# Patient Record
Sex: Female | Born: 1940
Health system: Southern US, Community
[De-identification: ages and names within clinical notes are randomized; demographics above are authoritative.]

---

## 2016-04-14 DIAGNOSIS — R350 Frequency of micturition: Secondary | ICD-10-CM | POA: Diagnosis not present

## 2016-04-14 DIAGNOSIS — R933 Abnormal findings on diagnostic imaging of other parts of digestive tract: Secondary | ICD-10-CM | POA: Diagnosis not present

## 2016-04-14 DIAGNOSIS — Z87442 Personal history of urinary calculi: Secondary | ICD-10-CM

## 2016-04-14 DIAGNOSIS — R339 Retention of urine, unspecified: Secondary | ICD-10-CM | POA: Diagnosis not present

## 2016-04-14 DIAGNOSIS — N390 Urinary tract infection, site not specified: Secondary | ICD-10-CM | POA: Diagnosis not present

## 2016-04-14 HISTORY — DX: Personal history of urinary calculi: Z87.442

## 2016-05-27 DIAGNOSIS — E559 Vitamin D deficiency, unspecified: Secondary | ICD-10-CM | POA: Diagnosis not present

## 2016-05-27 DIAGNOSIS — E7800 Pure hypercholesterolemia, unspecified: Secondary | ICD-10-CM | POA: Diagnosis not present

## 2016-06-05 DIAGNOSIS — M150 Primary generalized (osteo)arthritis: Secondary | ICD-10-CM | POA: Diagnosis not present

## 2016-06-05 DIAGNOSIS — Z6841 Body Mass Index (BMI) 40.0 and over, adult: Secondary | ICD-10-CM | POA: Diagnosis not present

## 2016-06-05 DIAGNOSIS — H6123 Impacted cerumen, bilateral: Secondary | ICD-10-CM | POA: Diagnosis not present

## 2016-06-05 DIAGNOSIS — R350 Frequency of micturition: Secondary | ICD-10-CM | POA: Diagnosis not present

## 2016-06-05 DIAGNOSIS — Z79899 Other long term (current) drug therapy: Secondary | ICD-10-CM | POA: Diagnosis not present

## 2016-06-05 DIAGNOSIS — E7800 Pure hypercholesterolemia, unspecified: Secondary | ICD-10-CM | POA: Diagnosis not present

## 2016-06-05 DIAGNOSIS — I10 Essential (primary) hypertension: Secondary | ICD-10-CM | POA: Diagnosis not present

## 2016-07-10 DIAGNOSIS — N1330 Unspecified hydronephrosis: Secondary | ICD-10-CM | POA: Diagnosis not present

## 2016-07-10 DIAGNOSIS — R102 Pelvic and perineal pain: Secondary | ICD-10-CM | POA: Diagnosis not present

## 2016-07-10 DIAGNOSIS — R938 Abnormal findings on diagnostic imaging of other specified body structures: Secondary | ICD-10-CM | POA: Diagnosis not present

## 2016-07-10 DIAGNOSIS — N281 Cyst of kidney, acquired: Secondary | ICD-10-CM | POA: Diagnosis not present

## 2016-07-10 DIAGNOSIS — N852 Hypertrophy of uterus: Secondary | ICD-10-CM | POA: Diagnosis not present

## 2016-09-04 DIAGNOSIS — Z0000 Encounter for general adult medical examination without abnormal findings: Secondary | ICD-10-CM | POA: Diagnosis not present

## 2016-09-04 DIAGNOSIS — Z7189 Other specified counseling: Secondary | ICD-10-CM | POA: Diagnosis not present

## 2016-09-17 DIAGNOSIS — M79675 Pain in left toe(s): Secondary | ICD-10-CM | POA: Diagnosis not present

## 2016-09-17 DIAGNOSIS — M79674 Pain in right toe(s): Secondary | ICD-10-CM | POA: Diagnosis not present

## 2016-09-17 DIAGNOSIS — I739 Peripheral vascular disease, unspecified: Secondary | ICD-10-CM | POA: Diagnosis not present

## 2016-09-17 DIAGNOSIS — L851 Acquired keratosis [keratoderma] palmaris et plantaris: Secondary | ICD-10-CM | POA: Diagnosis not present

## 2016-09-17 DIAGNOSIS — L609 Nail disorder, unspecified: Secondary | ICD-10-CM | POA: Diagnosis not present

## 2016-09-17 DIAGNOSIS — B351 Tinea unguium: Secondary | ICD-10-CM | POA: Diagnosis not present

## 2016-10-23 DIAGNOSIS — K649 Unspecified hemorrhoids: Secondary | ICD-10-CM | POA: Diagnosis not present

## 2016-10-23 DIAGNOSIS — R350 Frequency of micturition: Secondary | ICD-10-CM | POA: Diagnosis not present

## 2016-10-23 DIAGNOSIS — N814 Uterovaginal prolapse, unspecified: Secondary | ICD-10-CM | POA: Diagnosis not present

## 2016-10-23 DIAGNOSIS — R600 Localized edema: Secondary | ICD-10-CM | POA: Diagnosis not present

## 2016-11-05 DIAGNOSIS — Z9181 History of falling: Secondary | ICD-10-CM | POA: Diagnosis not present

## 2016-11-05 DIAGNOSIS — K648 Other hemorrhoids: Secondary | ICD-10-CM | POA: Diagnosis not present

## 2016-11-26 DIAGNOSIS — H5213 Myopia, bilateral: Secondary | ICD-10-CM | POA: Diagnosis not present

## 2016-12-08 DIAGNOSIS — R351 Nocturia: Secondary | ICD-10-CM | POA: Diagnosis not present

## 2016-12-08 DIAGNOSIS — R350 Frequency of micturition: Secondary | ICD-10-CM | POA: Diagnosis not present

## 2016-12-08 DIAGNOSIS — M2550 Pain in unspecified joint: Secondary | ICD-10-CM | POA: Diagnosis not present

## 2016-12-08 DIAGNOSIS — Z23 Encounter for immunization: Secondary | ICD-10-CM | POA: Diagnosis not present

## 2016-12-08 DIAGNOSIS — K644 Residual hemorrhoidal skin tags: Secondary | ICD-10-CM | POA: Diagnosis not present

## 2016-12-08 DIAGNOSIS — G4733 Obstructive sleep apnea (adult) (pediatric): Secondary | ICD-10-CM | POA: Diagnosis not present

## 2016-12-11 DIAGNOSIS — E785 Hyperlipidemia, unspecified: Secondary | ICD-10-CM | POA: Diagnosis not present

## 2016-12-11 DIAGNOSIS — I10 Essential (primary) hypertension: Secondary | ICD-10-CM | POA: Diagnosis not present

## 2016-12-11 DIAGNOSIS — K644 Residual hemorrhoidal skin tags: Secondary | ICD-10-CM | POA: Diagnosis not present

## 2016-12-11 DIAGNOSIS — M1990 Unspecified osteoarthritis, unspecified site: Secondary | ICD-10-CM | POA: Diagnosis not present

## 2016-12-11 DIAGNOSIS — K641 Second degree hemorrhoids: Secondary | ICD-10-CM | POA: Diagnosis not present

## 2017-02-19 DIAGNOSIS — R1033 Periumbilical pain: Secondary | ICD-10-CM | POA: Diagnosis not present

## 2017-02-19 DIAGNOSIS — R1084 Generalized abdominal pain: Secondary | ICD-10-CM | POA: Diagnosis not present

## 2017-02-19 DIAGNOSIS — R1013 Epigastric pain: Secondary | ICD-10-CM | POA: Diagnosis not present

## 2017-02-19 DIAGNOSIS — Z791 Long term (current) use of non-steroidal anti-inflammatories (NSAID): Secondary | ICD-10-CM | POA: Diagnosis not present

## 2017-02-19 DIAGNOSIS — Z79899 Other long term (current) drug therapy: Secondary | ICD-10-CM | POA: Diagnosis not present

## 2017-03-05 DIAGNOSIS — R109 Unspecified abdominal pain: Secondary | ICD-10-CM | POA: Diagnosis not present

## 2017-03-05 DIAGNOSIS — N281 Cyst of kidney, acquired: Secondary | ICD-10-CM | POA: Diagnosis not present

## 2017-03-05 DIAGNOSIS — K8020 Calculus of gallbladder without cholecystitis without obstruction: Secondary | ICD-10-CM | POA: Diagnosis not present

## 2017-03-10 DIAGNOSIS — H9190 Unspecified hearing loss, unspecified ear: Secondary | ICD-10-CM | POA: Diagnosis not present

## 2017-03-11 DIAGNOSIS — R1011 Right upper quadrant pain: Secondary | ICD-10-CM | POA: Diagnosis not present

## 2017-03-11 DIAGNOSIS — K8010 Calculus of gallbladder with chronic cholecystitis without obstruction: Secondary | ICD-10-CM | POA: Diagnosis not present

## 2017-03-11 DIAGNOSIS — N200 Calculus of kidney: Secondary | ICD-10-CM | POA: Diagnosis not present

## 2017-03-11 DIAGNOSIS — K8020 Calculus of gallbladder without cholecystitis without obstruction: Secondary | ICD-10-CM | POA: Diagnosis not present

## 2017-03-11 DIAGNOSIS — K8000 Calculus of gallbladder with acute cholecystitis without obstruction: Secondary | ICD-10-CM | POA: Diagnosis not present

## 2017-03-11 DIAGNOSIS — M5410 Radiculopathy, site unspecified: Secondary | ICD-10-CM | POA: Diagnosis not present

## 2017-03-11 DIAGNOSIS — M1990 Unspecified osteoarthritis, unspecified site: Secondary | ICD-10-CM | POA: Diagnosis not present

## 2017-03-11 DIAGNOSIS — Z79810 Long term (current) use of selective estrogen receptor modulators (SERMs): Secondary | ICD-10-CM | POA: Diagnosis not present

## 2017-03-11 DIAGNOSIS — K810 Acute cholecystitis: Secondary | ICD-10-CM | POA: Diagnosis not present

## 2017-03-11 DIAGNOSIS — I10 Essential (primary) hypertension: Secondary | ICD-10-CM | POA: Diagnosis not present

## 2017-03-11 DIAGNOSIS — K819 Cholecystitis, unspecified: Secondary | ICD-10-CM | POA: Diagnosis not present

## 2017-03-11 DIAGNOSIS — K828 Other specified diseases of gallbladder: Secondary | ICD-10-CM | POA: Diagnosis not present

## 2017-03-11 DIAGNOSIS — E785 Hyperlipidemia, unspecified: Secondary | ICD-10-CM | POA: Diagnosis not present

## 2017-03-11 DIAGNOSIS — R1013 Epigastric pain: Secondary | ICD-10-CM | POA: Diagnosis not present

## 2017-03-11 DIAGNOSIS — K219 Gastro-esophageal reflux disease without esophagitis: Secondary | ICD-10-CM | POA: Diagnosis not present

## 2017-03-15 DIAGNOSIS — L539 Erythematous condition, unspecified: Secondary | ICD-10-CM | POA: Diagnosis not present

## 2017-03-15 DIAGNOSIS — K769 Liver disease, unspecified: Secondary | ICD-10-CM | POA: Diagnosis not present

## 2017-03-15 DIAGNOSIS — K5900 Constipation, unspecified: Secondary | ICD-10-CM | POA: Diagnosis not present

## 2017-03-15 DIAGNOSIS — E876 Hypokalemia: Secondary | ICD-10-CM | POA: Diagnosis not present

## 2017-03-15 DIAGNOSIS — R1030 Lower abdominal pain, unspecified: Secondary | ICD-10-CM | POA: Diagnosis not present

## 2017-03-15 DIAGNOSIS — N858 Other specified noninflammatory disorders of uterus: Secondary | ICD-10-CM | POA: Diagnosis not present

## 2017-03-15 DIAGNOSIS — I10 Essential (primary) hypertension: Secondary | ICD-10-CM | POA: Diagnosis not present

## 2017-03-15 DIAGNOSIS — R140 Abdominal distension (gaseous): Secondary | ICD-10-CM | POA: Diagnosis not present

## 2017-03-15 DIAGNOSIS — R935 Abnormal findings on diagnostic imaging of other abdominal regions, including retroperitoneum: Secondary | ICD-10-CM | POA: Diagnosis not present

## 2017-03-15 DIAGNOSIS — M4802 Spinal stenosis, cervical region: Secondary | ICD-10-CM | POA: Diagnosis not present

## 2017-03-15 DIAGNOSIS — N3289 Other specified disorders of bladder: Secondary | ICD-10-CM | POA: Diagnosis not present

## 2017-03-15 DIAGNOSIS — E785 Hyperlipidemia, unspecified: Secondary | ICD-10-CM | POA: Diagnosis not present

## 2017-03-15 DIAGNOSIS — K649 Unspecified hemorrhoids: Secondary | ICD-10-CM | POA: Diagnosis not present

## 2017-03-15 DIAGNOSIS — K219 Gastro-esophageal reflux disease without esophagitis: Secondary | ICD-10-CM | POA: Diagnosis not present

## 2017-03-15 DIAGNOSIS — D649 Anemia, unspecified: Secondary | ICD-10-CM | POA: Diagnosis not present

## 2017-03-15 DIAGNOSIS — R319 Hematuria, unspecified: Secondary | ICD-10-CM | POA: Diagnosis not present

## 2017-03-15 DIAGNOSIS — R1031 Right lower quadrant pain: Secondary | ICD-10-CM | POA: Diagnosis not present

## 2017-03-15 DIAGNOSIS — G629 Polyneuropathy, unspecified: Secondary | ICD-10-CM | POA: Diagnosis not present

## 2017-03-15 DIAGNOSIS — R932 Abnormal findings on diagnostic imaging of liver and biliary tract: Secondary | ICD-10-CM | POA: Diagnosis not present

## 2017-03-15 DIAGNOSIS — N368 Other specified disorders of urethra: Secondary | ICD-10-CM | POA: Diagnosis not present

## 2017-03-15 DIAGNOSIS — C541 Malignant neoplasm of endometrium: Secondary | ICD-10-CM | POA: Diagnosis not present

## 2017-03-16 DIAGNOSIS — N858 Other specified noninflammatory disorders of uterus: Secondary | ICD-10-CM | POA: Diagnosis not present

## 2017-03-16 DIAGNOSIS — K769 Liver disease, unspecified: Secondary | ICD-10-CM | POA: Diagnosis not present

## 2017-03-16 DIAGNOSIS — R197 Diarrhea, unspecified: Secondary | ICD-10-CM | POA: Diagnosis not present

## 2017-03-16 DIAGNOSIS — R319 Hematuria, unspecified: Secondary | ICD-10-CM | POA: Diagnosis not present

## 2017-03-16 DIAGNOSIS — E785 Hyperlipidemia, unspecified: Secondary | ICD-10-CM | POA: Diagnosis not present

## 2017-03-16 DIAGNOSIS — K649 Unspecified hemorrhoids: Secondary | ICD-10-CM | POA: Insufficient documentation

## 2017-03-16 DIAGNOSIS — G629 Polyneuropathy, unspecified: Secondary | ICD-10-CM | POA: Diagnosis not present

## 2017-03-16 DIAGNOSIS — E876 Hypokalemia: Secondary | ICD-10-CM | POA: Diagnosis not present

## 2017-03-16 DIAGNOSIS — D649 Anemia, unspecified: Secondary | ICD-10-CM | POA: Diagnosis not present

## 2017-03-16 DIAGNOSIS — Z9049 Acquired absence of other specified parts of digestive tract: Secondary | ICD-10-CM

## 2017-03-16 DIAGNOSIS — R1030 Lower abdominal pain, unspecified: Secondary | ICD-10-CM | POA: Diagnosis not present

## 2017-03-16 DIAGNOSIS — R109 Unspecified abdominal pain: Secondary | ICD-10-CM | POA: Diagnosis not present

## 2017-03-16 DIAGNOSIS — K219 Gastro-esophageal reflux disease without esophagitis: Secondary | ICD-10-CM | POA: Diagnosis not present

## 2017-03-16 DIAGNOSIS — I10 Essential (primary) hypertension: Secondary | ICD-10-CM | POA: Diagnosis not present

## 2017-03-16 DIAGNOSIS — C541 Malignant neoplasm of endometrium: Secondary | ICD-10-CM | POA: Diagnosis not present

## 2017-03-16 DIAGNOSIS — M4802 Spinal stenosis, cervical region: Secondary | ICD-10-CM | POA: Diagnosis not present

## 2017-03-16 HISTORY — DX: Unspecified hemorrhoids: K64.9

## 2017-03-16 HISTORY — DX: Acquired absence of other specified parts of digestive tract: Z90.49

## 2017-03-17 DIAGNOSIS — K5900 Constipation, unspecified: Secondary | ICD-10-CM | POA: Diagnosis not present

## 2017-03-17 DIAGNOSIS — N858 Other specified noninflammatory disorders of uterus: Secondary | ICD-10-CM | POA: Diagnosis not present

## 2017-03-17 DIAGNOSIS — N859 Noninflammatory disorder of uterus, unspecified: Secondary | ICD-10-CM | POA: Diagnosis not present

## 2017-03-17 DIAGNOSIS — R1900 Intra-abdominal and pelvic swelling, mass and lump, unspecified site: Secondary | ICD-10-CM | POA: Diagnosis not present

## 2017-03-17 DIAGNOSIS — I10 Essential (primary) hypertension: Secondary | ICD-10-CM | POA: Diagnosis not present

## 2017-03-17 DIAGNOSIS — R1030 Lower abdominal pain, unspecified: Secondary | ICD-10-CM | POA: Diagnosis not present

## 2017-03-17 DIAGNOSIS — R109 Unspecified abdominal pain: Secondary | ICD-10-CM | POA: Diagnosis not present

## 2017-03-17 DIAGNOSIS — K649 Unspecified hemorrhoids: Secondary | ICD-10-CM | POA: Diagnosis not present

## 2017-03-17 DIAGNOSIS — R319 Hematuria, unspecified: Secondary | ICD-10-CM | POA: Diagnosis not present

## 2017-03-18 DIAGNOSIS — R109 Unspecified abdominal pain: Secondary | ICD-10-CM | POA: Diagnosis not present

## 2017-03-18 DIAGNOSIS — N858 Other specified noninflammatory disorders of uterus: Secondary | ICD-10-CM | POA: Diagnosis not present

## 2017-03-18 DIAGNOSIS — R970 Elevated carcinoembryonic antigen [CEA]: Secondary | ICD-10-CM | POA: Diagnosis not present

## 2017-03-18 DIAGNOSIS — K649 Unspecified hemorrhoids: Secondary | ICD-10-CM | POA: Diagnosis not present

## 2017-03-18 DIAGNOSIS — Z9049 Acquired absence of other specified parts of digestive tract: Secondary | ICD-10-CM | POA: Diagnosis not present

## 2017-03-18 DIAGNOSIS — R1030 Lower abdominal pain, unspecified: Secondary | ICD-10-CM | POA: Diagnosis not present

## 2017-03-20 DIAGNOSIS — Z7409 Other reduced mobility: Secondary | ICD-10-CM | POA: Diagnosis not present

## 2017-03-20 DIAGNOSIS — N897 Hematocolpos: Secondary | ICD-10-CM | POA: Diagnosis not present

## 2017-03-20 DIAGNOSIS — M7989 Other specified soft tissue disorders: Secondary | ICD-10-CM | POA: Diagnosis not present

## 2017-03-20 DIAGNOSIS — N858 Other specified noninflammatory disorders of uterus: Secondary | ICD-10-CM | POA: Diagnosis not present

## 2017-03-20 DIAGNOSIS — Q6471 Congenital prolapse of urethra: Secondary | ICD-10-CM | POA: Diagnosis not present

## 2017-03-20 DIAGNOSIS — R1030 Lower abdominal pain, unspecified: Secondary | ICD-10-CM | POA: Diagnosis not present

## 2017-03-20 DIAGNOSIS — I10 Essential (primary) hypertension: Secondary | ICD-10-CM | POA: Diagnosis not present

## 2017-03-23 DIAGNOSIS — N898 Other specified noninflammatory disorders of vagina: Secondary | ICD-10-CM | POA: Diagnosis not present

## 2017-03-23 DIAGNOSIS — L539 Erythematous condition, unspecified: Secondary | ICD-10-CM | POA: Diagnosis not present

## 2017-03-23 DIAGNOSIS — N897 Hematocolpos: Secondary | ICD-10-CM | POA: Diagnosis not present

## 2017-03-23 DIAGNOSIS — N852 Hypertrophy of uterus: Secondary | ICD-10-CM | POA: Diagnosis not present

## 2017-03-23 DIAGNOSIS — N760 Acute vaginitis: Secondary | ICD-10-CM | POA: Diagnosis not present

## 2017-03-23 DIAGNOSIS — R600 Localized edema: Secondary | ICD-10-CM | POA: Diagnosis not present

## 2017-03-23 DIAGNOSIS — E785 Hyperlipidemia, unspecified: Secondary | ICD-10-CM | POA: Diagnosis not present

## 2017-03-23 DIAGNOSIS — M79604 Pain in right leg: Secondary | ICD-10-CM | POA: Diagnosis not present

## 2017-03-23 DIAGNOSIS — Z6835 Body mass index (BMI) 35.0-35.9, adult: Secondary | ICD-10-CM | POA: Diagnosis not present

## 2017-03-23 DIAGNOSIS — N1330 Unspecified hydronephrosis: Secondary | ICD-10-CM | POA: Diagnosis not present

## 2017-03-23 DIAGNOSIS — I10 Essential (primary) hypertension: Secondary | ICD-10-CM | POA: Diagnosis not present

## 2017-03-23 DIAGNOSIS — Z79899 Other long term (current) drug therapy: Secondary | ICD-10-CM | POA: Diagnosis not present

## 2017-04-02 DIAGNOSIS — M79675 Pain in left toe(s): Secondary | ICD-10-CM | POA: Diagnosis not present

## 2017-04-02 DIAGNOSIS — I739 Peripheral vascular disease, unspecified: Secondary | ICD-10-CM | POA: Diagnosis not present

## 2017-04-02 DIAGNOSIS — B351 Tinea unguium: Secondary | ICD-10-CM | POA: Diagnosis not present

## 2017-04-02 DIAGNOSIS — L84 Corns and callosities: Secondary | ICD-10-CM | POA: Diagnosis not present

## 2017-04-02 DIAGNOSIS — M79674 Pain in right toe(s): Secondary | ICD-10-CM | POA: Diagnosis not present

## 2017-05-08 DIAGNOSIS — Z09 Encounter for follow-up examination after completed treatment for conditions other than malignant neoplasm: Secondary | ICD-10-CM | POA: Diagnosis not present

## 2017-05-08 DIAGNOSIS — N882 Stricture and stenosis of cervix uteri: Secondary | ICD-10-CM | POA: Diagnosis not present

## 2017-05-21 DIAGNOSIS — M2550 Pain in unspecified joint: Secondary | ICD-10-CM | POA: Diagnosis not present

## 2017-05-21 DIAGNOSIS — M150 Primary generalized (osteo)arthritis: Secondary | ICD-10-CM | POA: Diagnosis not present

## 2017-05-26 DIAGNOSIS — L723 Sebaceous cyst: Secondary | ICD-10-CM | POA: Diagnosis not present

## 2017-05-26 DIAGNOSIS — E669 Obesity, unspecified: Secondary | ICD-10-CM | POA: Diagnosis not present

## 2017-05-26 DIAGNOSIS — H6121 Impacted cerumen, right ear: Secondary | ICD-10-CM | POA: Diagnosis not present

## 2017-05-26 DIAGNOSIS — N3946 Mixed incontinence: Secondary | ICD-10-CM | POA: Diagnosis not present

## 2017-05-26 DIAGNOSIS — H259 Unspecified age-related cataract: Secondary | ICD-10-CM | POA: Diagnosis not present

## 2017-07-01 DIAGNOSIS — M1990 Unspecified osteoarthritis, unspecified site: Secondary | ICD-10-CM

## 2017-07-01 DIAGNOSIS — E782 Mixed hyperlipidemia: Secondary | ICD-10-CM | POA: Diagnosis present

## 2017-07-01 DIAGNOSIS — E785 Hyperlipidemia, unspecified: Secondary | ICD-10-CM

## 2017-07-01 DIAGNOSIS — H25013 Cortical age-related cataract, bilateral: Secondary | ICD-10-CM | POA: Diagnosis not present

## 2017-07-01 DIAGNOSIS — I10 Essential (primary) hypertension: Secondary | ICD-10-CM

## 2017-07-01 HISTORY — DX: Hyperlipidemia, unspecified: E78.5

## 2017-07-01 HISTORY — DX: Essential (primary) hypertension: I10

## 2017-07-01 HISTORY — DX: Unspecified osteoarthritis, unspecified site: M19.90

## 2017-07-02 DIAGNOSIS — L723 Sebaceous cyst: Secondary | ICD-10-CM | POA: Diagnosis not present

## 2017-07-02 DIAGNOSIS — L219 Seborrheic dermatitis, unspecified: Secondary | ICD-10-CM | POA: Diagnosis not present

## 2017-07-23 DIAGNOSIS — E7800 Pure hypercholesterolemia, unspecified: Secondary | ICD-10-CM | POA: Diagnosis not present

## 2017-07-23 DIAGNOSIS — R7302 Impaired glucose tolerance (oral): Secondary | ICD-10-CM | POA: Diagnosis not present

## 2017-08-06 DIAGNOSIS — I10 Essential (primary) hypertension: Secondary | ICD-10-CM | POA: Diagnosis not present

## 2017-08-06 DIAGNOSIS — Z79899 Other long term (current) drug therapy: Secondary | ICD-10-CM | POA: Diagnosis not present

## 2017-08-06 DIAGNOSIS — Z9049 Acquired absence of other specified parts of digestive tract: Secondary | ICD-10-CM | POA: Diagnosis not present

## 2017-08-06 DIAGNOSIS — G8929 Other chronic pain: Secondary | ICD-10-CM | POA: Diagnosis not present

## 2017-08-06 DIAGNOSIS — H2512 Age-related nuclear cataract, left eye: Secondary | ICD-10-CM | POA: Diagnosis not present

## 2017-08-06 DIAGNOSIS — M170 Bilateral primary osteoarthritis of knee: Secondary | ICD-10-CM | POA: Diagnosis not present

## 2017-08-06 DIAGNOSIS — M160 Bilateral primary osteoarthritis of hip: Secondary | ICD-10-CM | POA: Diagnosis not present

## 2017-08-06 DIAGNOSIS — E785 Hyperlipidemia, unspecified: Secondary | ICD-10-CM | POA: Diagnosis not present

## 2017-08-27 DIAGNOSIS — H259 Unspecified age-related cataract: Secondary | ICD-10-CM | POA: Diagnosis not present

## 2017-08-27 DIAGNOSIS — B351 Tinea unguium: Secondary | ICD-10-CM | POA: Diagnosis not present

## 2017-08-27 DIAGNOSIS — Z6838 Body mass index (BMI) 38.0-38.9, adult: Secondary | ICD-10-CM | POA: Diagnosis not present

## 2017-08-27 DIAGNOSIS — E7800 Pure hypercholesterolemia, unspecified: Secondary | ICD-10-CM | POA: Diagnosis not present

## 2017-08-27 DIAGNOSIS — N3946 Mixed incontinence: Secondary | ICD-10-CM | POA: Diagnosis not present

## 2017-10-15 DIAGNOSIS — Z23 Encounter for immunization: Secondary | ICD-10-CM | POA: Diagnosis not present

## 2017-10-15 DIAGNOSIS — R220 Localized swelling, mass and lump, head: Secondary | ICD-10-CM | POA: Diagnosis not present

## 2017-10-22 DIAGNOSIS — Z961 Presence of intraocular lens: Secondary | ICD-10-CM | POA: Diagnosis not present

## 2017-10-22 DIAGNOSIS — Z9842 Cataract extraction status, left eye: Secondary | ICD-10-CM | POA: Diagnosis not present

## 2017-10-22 DIAGNOSIS — H25011 Cortical age-related cataract, right eye: Secondary | ICD-10-CM | POA: Diagnosis not present

## 2017-10-22 DIAGNOSIS — Z79891 Long term (current) use of opiate analgesic: Secondary | ICD-10-CM | POA: Diagnosis not present

## 2017-10-22 DIAGNOSIS — I10 Essential (primary) hypertension: Secondary | ICD-10-CM | POA: Diagnosis not present

## 2017-10-22 DIAGNOSIS — E785 Hyperlipidemia, unspecified: Secondary | ICD-10-CM | POA: Diagnosis not present

## 2017-10-22 DIAGNOSIS — H52221 Regular astigmatism, right eye: Secondary | ICD-10-CM | POA: Diagnosis not present

## 2017-10-22 DIAGNOSIS — H2511 Age-related nuclear cataract, right eye: Secondary | ICD-10-CM | POA: Diagnosis not present

## 2017-10-22 DIAGNOSIS — Z79899 Other long term (current) drug therapy: Secondary | ICD-10-CM | POA: Diagnosis not present

## 2017-10-22 DIAGNOSIS — G8929 Other chronic pain: Secondary | ICD-10-CM | POA: Diagnosis not present

## 2017-11-13 DIAGNOSIS — N858 Other specified noninflammatory disorders of uterus: Secondary | ICD-10-CM | POA: Diagnosis not present

## 2017-12-17 DIAGNOSIS — Z6838 Body mass index (BMI) 38.0-38.9, adult: Secondary | ICD-10-CM | POA: Diagnosis not present

## 2017-12-17 DIAGNOSIS — R600 Localized edema: Secondary | ICD-10-CM | POA: Diagnosis not present

## 2017-12-17 DIAGNOSIS — M2550 Pain in unspecified joint: Secondary | ICD-10-CM | POA: Diagnosis not present

## 2017-12-23 DIAGNOSIS — R600 Localized edema: Secondary | ICD-10-CM | POA: Diagnosis not present

## 2017-12-25 DIAGNOSIS — M2550 Pain in unspecified joint: Secondary | ICD-10-CM | POA: Diagnosis not present

## 2017-12-25 DIAGNOSIS — M25562 Pain in left knee: Secondary | ICD-10-CM | POA: Diagnosis not present

## 2017-12-25 DIAGNOSIS — M25561 Pain in right knee: Secondary | ICD-10-CM | POA: Diagnosis not present

## 2017-12-25 DIAGNOSIS — M1612 Unilateral primary osteoarthritis, left hip: Secondary | ICD-10-CM | POA: Diagnosis not present

## 2017-12-25 DIAGNOSIS — M170 Bilateral primary osteoarthritis of knee: Secondary | ICD-10-CM | POA: Diagnosis not present

## 2017-12-28 DIAGNOSIS — R609 Edema, unspecified: Secondary | ICD-10-CM | POA: Diagnosis not present

## 2017-12-28 DIAGNOSIS — G8929 Other chronic pain: Secondary | ICD-10-CM | POA: Diagnosis not present

## 2017-12-28 DIAGNOSIS — R220 Localized swelling, mass and lump, head: Secondary | ICD-10-CM | POA: Diagnosis not present

## 2017-12-28 DIAGNOSIS — I10 Essential (primary) hypertension: Secondary | ICD-10-CM | POA: Diagnosis not present

## 2017-12-28 DIAGNOSIS — Z6839 Body mass index (BMI) 39.0-39.9, adult: Secondary | ICD-10-CM | POA: Diagnosis not present

## 2017-12-28 DIAGNOSIS — M150 Primary generalized (osteo)arthritis: Secondary | ICD-10-CM | POA: Diagnosis not present

## 2017-12-30 DIAGNOSIS — R220 Localized swelling, mass and lump, head: Secondary | ICD-10-CM | POA: Diagnosis not present

## 2018-01-11 DIAGNOSIS — R220 Localized swelling, mass and lump, head: Secondary | ICD-10-CM | POA: Diagnosis not present

## 2018-01-11 DIAGNOSIS — E042 Nontoxic multinodular goiter: Secondary | ICD-10-CM | POA: Diagnosis not present

## 2018-01-11 DIAGNOSIS — R590 Localized enlarged lymph nodes: Secondary | ICD-10-CM | POA: Diagnosis not present

## 2018-01-13 DIAGNOSIS — R220 Localized swelling, mass and lump, head: Secondary | ICD-10-CM | POA: Diagnosis not present

## 2018-01-27 DIAGNOSIS — R600 Localized edema: Secondary | ICD-10-CM | POA: Diagnosis not present

## 2018-02-15 DIAGNOSIS — R220 Localized swelling, mass and lump, head: Secondary | ICD-10-CM | POA: Diagnosis not present

## 2018-02-15 DIAGNOSIS — I889 Nonspecific lymphadenitis, unspecified: Secondary | ICD-10-CM | POA: Diagnosis not present

## 2018-02-15 DIAGNOSIS — K219 Gastro-esophageal reflux disease without esophagitis: Secondary | ICD-10-CM | POA: Diagnosis not present

## 2018-02-15 DIAGNOSIS — Z79899 Other long term (current) drug therapy: Secondary | ICD-10-CM | POA: Diagnosis not present

## 2018-02-15 DIAGNOSIS — I10 Essential (primary) hypertension: Secondary | ICD-10-CM | POA: Diagnosis not present

## 2018-02-15 DIAGNOSIS — K118 Other diseases of salivary glands: Secondary | ICD-10-CM | POA: Diagnosis not present

## 2018-02-15 DIAGNOSIS — E785 Hyperlipidemia, unspecified: Secondary | ICD-10-CM | POA: Diagnosis not present

## 2018-02-15 DIAGNOSIS — M1990 Unspecified osteoarthritis, unspecified site: Secondary | ICD-10-CM | POA: Diagnosis not present

## 2018-02-24 DIAGNOSIS — R220 Localized swelling, mass and lump, head: Secondary | ICD-10-CM | POA: Diagnosis not present

## 2018-02-24 DIAGNOSIS — E049 Nontoxic goiter, unspecified: Secondary | ICD-10-CM | POA: Diagnosis not present

## 2018-02-24 DIAGNOSIS — Z09 Encounter for follow-up examination after completed treatment for conditions other than malignant neoplasm: Secondary | ICD-10-CM | POA: Diagnosis not present

## 2018-02-25 DIAGNOSIS — E7800 Pure hypercholesterolemia, unspecified: Secondary | ICD-10-CM | POA: Diagnosis not present

## 2018-03-02 DIAGNOSIS — R946 Abnormal results of thyroid function studies: Secondary | ICD-10-CM | POA: Diagnosis not present

## 2018-03-02 DIAGNOSIS — M2550 Pain in unspecified joint: Secondary | ICD-10-CM | POA: Diagnosis not present

## 2018-03-02 DIAGNOSIS — R600 Localized edema: Secondary | ICD-10-CM | POA: Diagnosis not present

## 2018-03-02 DIAGNOSIS — R69 Illness, unspecified: Secondary | ICD-10-CM | POA: Diagnosis not present

## 2018-03-11 DIAGNOSIS — E042 Nontoxic multinodular goiter: Secondary | ICD-10-CM | POA: Diagnosis not present

## 2018-03-11 DIAGNOSIS — R220 Localized swelling, mass and lump, head: Secondary | ICD-10-CM | POA: Diagnosis not present

## 2018-03-11 DIAGNOSIS — R221 Localized swelling, mass and lump, neck: Secondary | ICD-10-CM | POA: Diagnosis not present

## 2018-03-30 DIAGNOSIS — E042 Nontoxic multinodular goiter: Secondary | ICD-10-CM | POA: Diagnosis not present

## 2018-03-30 DIAGNOSIS — E049 Nontoxic goiter, unspecified: Secondary | ICD-10-CM | POA: Diagnosis not present

## 2018-06-01 DIAGNOSIS — E042 Nontoxic multinodular goiter: Secondary | ICD-10-CM

## 2018-06-01 DIAGNOSIS — R220 Localized swelling, mass and lump, head: Secondary | ICD-10-CM | POA: Diagnosis not present

## 2018-06-01 DIAGNOSIS — Z6841 Body Mass Index (BMI) 40.0 and over, adult: Secondary | ICD-10-CM | POA: Diagnosis not present

## 2018-06-01 HISTORY — DX: Nontoxic multinodular goiter: E04.2

## 2018-09-02 DIAGNOSIS — E042 Nontoxic multinodular goiter: Secondary | ICD-10-CM | POA: Diagnosis not present

## 2018-09-06 DIAGNOSIS — R600 Localized edema: Secondary | ICD-10-CM | POA: Diagnosis not present

## 2018-09-06 DIAGNOSIS — M2550 Pain in unspecified joint: Secondary | ICD-10-CM | POA: Diagnosis not present

## 2018-09-06 DIAGNOSIS — E7800 Pure hypercholesterolemia, unspecified: Secondary | ICD-10-CM | POA: Diagnosis not present

## 2018-09-06 DIAGNOSIS — E0590 Thyrotoxicosis, unspecified without thyrotoxic crisis or storm: Secondary | ICD-10-CM | POA: Diagnosis not present

## 2018-11-03 DIAGNOSIS — E7800 Pure hypercholesterolemia, unspecified: Secondary | ICD-10-CM | POA: Diagnosis not present

## 2018-11-03 DIAGNOSIS — E0590 Thyrotoxicosis, unspecified without thyrotoxic crisis or storm: Secondary | ICD-10-CM | POA: Diagnosis not present

## 2018-11-04 DIAGNOSIS — Z23 Encounter for immunization: Secondary | ICD-10-CM | POA: Diagnosis not present

## 2018-12-07 DIAGNOSIS — R600 Localized edema: Secondary | ICD-10-CM | POA: Diagnosis not present

## 2018-12-07 DIAGNOSIS — E0590 Thyrotoxicosis, unspecified without thyrotoxic crisis or storm: Secondary | ICD-10-CM | POA: Diagnosis not present

## 2018-12-07 DIAGNOSIS — E7800 Pure hypercholesterolemia, unspecified: Secondary | ICD-10-CM | POA: Diagnosis not present

## 2018-12-07 DIAGNOSIS — M2550 Pain in unspecified joint: Secondary | ICD-10-CM | POA: Diagnosis not present

## 2019-03-07 DIAGNOSIS — E039 Hypothyroidism, unspecified: Secondary | ICD-10-CM | POA: Diagnosis not present

## 2019-03-07 DIAGNOSIS — E0590 Thyrotoxicosis, unspecified without thyrotoxic crisis or storm: Secondary | ICD-10-CM | POA: Diagnosis not present

## 2019-03-17 DIAGNOSIS — E0590 Thyrotoxicosis, unspecified without thyrotoxic crisis or storm: Secondary | ICD-10-CM | POA: Diagnosis not present

## 2019-03-17 DIAGNOSIS — R600 Localized edema: Secondary | ICD-10-CM | POA: Diagnosis not present

## 2019-03-17 DIAGNOSIS — M2550 Pain in unspecified joint: Secondary | ICD-10-CM | POA: Diagnosis not present

## 2019-03-17 DIAGNOSIS — Z6838 Body mass index (BMI) 38.0-38.9, adult: Secondary | ICD-10-CM | POA: Diagnosis not present

## 2019-03-24 DIAGNOSIS — Z6841 Body Mass Index (BMI) 40.0 and over, adult: Secondary | ICD-10-CM | POA: Diagnosis not present

## 2019-03-24 DIAGNOSIS — E0590 Thyrotoxicosis, unspecified without thyrotoxic crisis or storm: Secondary | ICD-10-CM | POA: Diagnosis not present

## 2019-03-24 DIAGNOSIS — I10 Essential (primary) hypertension: Secondary | ICD-10-CM | POA: Diagnosis not present

## 2019-03-24 DIAGNOSIS — M79604 Pain in right leg: Secondary | ICD-10-CM | POA: Diagnosis not present

## 2019-03-24 DIAGNOSIS — R269 Unspecified abnormalities of gait and mobility: Secondary | ICD-10-CM | POA: Diagnosis not present

## 2019-03-24 DIAGNOSIS — M79605 Pain in left leg: Secondary | ICD-10-CM | POA: Diagnosis not present

## 2019-03-24 DIAGNOSIS — E7800 Pure hypercholesterolemia, unspecified: Secondary | ICD-10-CM | POA: Diagnosis not present

## 2019-03-29 DIAGNOSIS — M6281 Muscle weakness (generalized): Secondary | ICD-10-CM | POA: Diagnosis not present

## 2019-03-29 DIAGNOSIS — R2689 Other abnormalities of gait and mobility: Secondary | ICD-10-CM | POA: Diagnosis not present

## 2019-03-29 DIAGNOSIS — M159 Polyosteoarthritis, unspecified: Secondary | ICD-10-CM | POA: Diagnosis not present

## 2019-04-04 DIAGNOSIS — S39012A Strain of muscle, fascia and tendon of lower back, initial encounter: Secondary | ICD-10-CM | POA: Diagnosis not present

## 2019-04-11 DIAGNOSIS — R2689 Other abnormalities of gait and mobility: Secondary | ICD-10-CM | POA: Diagnosis not present

## 2019-04-11 DIAGNOSIS — M159 Polyosteoarthritis, unspecified: Secondary | ICD-10-CM | POA: Diagnosis not present

## 2019-04-11 DIAGNOSIS — M6281 Muscle weakness (generalized): Secondary | ICD-10-CM | POA: Diagnosis not present

## 2019-04-29 DIAGNOSIS — M7989 Other specified soft tissue disorders: Secondary | ICD-10-CM | POA: Diagnosis not present

## 2019-04-29 DIAGNOSIS — I872 Venous insufficiency (chronic) (peripheral): Secondary | ICD-10-CM | POA: Diagnosis not present

## 2019-04-29 DIAGNOSIS — I10 Essential (primary) hypertension: Secondary | ICD-10-CM | POA: Diagnosis not present

## 2019-05-05 DIAGNOSIS — I872 Venous insufficiency (chronic) (peripheral): Secondary | ICD-10-CM | POA: Diagnosis not present

## 2019-06-29 DIAGNOSIS — Z993 Dependence on wheelchair: Secondary | ICD-10-CM | POA: Diagnosis not present

## 2019-06-30 DIAGNOSIS — Z993 Dependence on wheelchair: Secondary | ICD-10-CM | POA: Diagnosis not present

## 2019-07-28 DIAGNOSIS — M25561 Pain in right knee: Secondary | ICD-10-CM | POA: Diagnosis not present

## 2019-07-28 DIAGNOSIS — R600 Localized edema: Secondary | ICD-10-CM | POA: Diagnosis not present

## 2019-07-28 DIAGNOSIS — G8929 Other chronic pain: Secondary | ICD-10-CM | POA: Diagnosis not present

## 2019-07-28 DIAGNOSIS — R269 Unspecified abnormalities of gait and mobility: Secondary | ICD-10-CM | POA: Diagnosis not present

## 2019-07-28 DIAGNOSIS — I10 Essential (primary) hypertension: Secondary | ICD-10-CM | POA: Diagnosis not present

## 2019-08-05 DIAGNOSIS — M1612 Unilateral primary osteoarthritis, left hip: Secondary | ICD-10-CM | POA: Diagnosis not present

## 2019-08-05 DIAGNOSIS — M545 Low back pain: Secondary | ICD-10-CM | POA: Diagnosis not present

## 2019-08-05 DIAGNOSIS — M1711 Unilateral primary osteoarthritis, right knee: Secondary | ICD-10-CM | POA: Diagnosis not present

## 2019-09-02 DIAGNOSIS — M1612 Unilateral primary osteoarthritis, left hip: Secondary | ICD-10-CM | POA: Diagnosis not present

## 2019-09-02 DIAGNOSIS — M545 Low back pain: Secondary | ICD-10-CM | POA: Diagnosis not present

## 2019-09-02 DIAGNOSIS — M1711 Unilateral primary osteoarthritis, right knee: Secondary | ICD-10-CM | POA: Diagnosis not present

## 2019-09-06 DIAGNOSIS — L03119 Cellulitis of unspecified part of limb: Secondary | ICD-10-CM | POA: Diagnosis not present

## 2019-09-06 DIAGNOSIS — I10 Essential (primary) hypertension: Secondary | ICD-10-CM | POA: Diagnosis not present

## 2019-09-09 DIAGNOSIS — L03119 Cellulitis of unspecified part of limb: Secondary | ICD-10-CM | POA: Diagnosis not present

## 2019-09-29 DIAGNOSIS — E7800 Pure hypercholesterolemia, unspecified: Secondary | ICD-10-CM | POA: Diagnosis not present

## 2019-09-29 DIAGNOSIS — Z6841 Body Mass Index (BMI) 40.0 and over, adult: Secondary | ICD-10-CM | POA: Diagnosis not present

## 2019-09-29 DIAGNOSIS — Z23 Encounter for immunization: Secondary | ICD-10-CM | POA: Diagnosis not present

## 2019-09-29 DIAGNOSIS — R269 Unspecified abnormalities of gait and mobility: Secondary | ICD-10-CM | POA: Diagnosis not present

## 2019-09-29 DIAGNOSIS — Z0000 Encounter for general adult medical examination without abnormal findings: Secondary | ICD-10-CM | POA: Diagnosis not present

## 2019-09-29 DIAGNOSIS — I872 Venous insufficiency (chronic) (peripheral): Secondary | ICD-10-CM | POA: Diagnosis not present

## 2019-09-29 DIAGNOSIS — E0590 Thyrotoxicosis, unspecified without thyrotoxic crisis or storm: Secondary | ICD-10-CM | POA: Diagnosis not present

## 2019-09-29 DIAGNOSIS — Z1389 Encounter for screening for other disorder: Secondary | ICD-10-CM | POA: Diagnosis not present

## 2019-09-29 DIAGNOSIS — Z79899 Other long term (current) drug therapy: Secondary | ICD-10-CM | POA: Diagnosis not present

## 2019-09-29 DIAGNOSIS — I10 Essential (primary) hypertension: Secondary | ICD-10-CM | POA: Diagnosis not present

## 2019-11-08 DIAGNOSIS — I10 Essential (primary) hypertension: Secondary | ICD-10-CM | POA: Diagnosis not present

## 2019-11-08 DIAGNOSIS — M5441 Lumbago with sciatica, right side: Secondary | ICD-10-CM | POA: Diagnosis not present

## 2019-11-08 DIAGNOSIS — L03115 Cellulitis of right lower limb: Secondary | ICD-10-CM | POA: Diagnosis not present

## 2019-11-08 DIAGNOSIS — M5442 Lumbago with sciatica, left side: Secondary | ICD-10-CM | POA: Diagnosis not present

## 2019-11-08 DIAGNOSIS — Z23 Encounter for immunization: Secondary | ICD-10-CM | POA: Diagnosis not present

## 2019-11-08 DIAGNOSIS — G8929 Other chronic pain: Secondary | ICD-10-CM | POA: Diagnosis not present

## 2019-11-28 DIAGNOSIS — I10 Essential (primary) hypertension: Secondary | ICD-10-CM | POA: Diagnosis not present

## 2019-11-28 DIAGNOSIS — I872 Venous insufficiency (chronic) (peripheral): Secondary | ICD-10-CM | POA: Diagnosis not present

## 2019-12-05 DIAGNOSIS — M1612 Unilateral primary osteoarthritis, left hip: Secondary | ICD-10-CM | POA: Diagnosis not present

## 2019-12-06 DIAGNOSIS — I10 Essential (primary) hypertension: Secondary | ICD-10-CM | POA: Diagnosis not present

## 2019-12-06 DIAGNOSIS — I872 Venous insufficiency (chronic) (peripheral): Secondary | ICD-10-CM | POA: Diagnosis not present

## 2019-12-06 DIAGNOSIS — M545 Low back pain, unspecified: Secondary | ICD-10-CM | POA: Diagnosis not present

## 2019-12-07 DIAGNOSIS — E0590 Thyrotoxicosis, unspecified without thyrotoxic crisis or storm: Secondary | ICD-10-CM | POA: Diagnosis not present

## 2019-12-07 DIAGNOSIS — M159 Polyosteoarthritis, unspecified: Secondary | ICD-10-CM | POA: Diagnosis not present

## 2019-12-07 DIAGNOSIS — G8929 Other chronic pain: Secondary | ICD-10-CM | POA: Diagnosis not present

## 2019-12-07 DIAGNOSIS — M6281 Muscle weakness (generalized): Secondary | ICD-10-CM | POA: Diagnosis not present

## 2019-12-07 DIAGNOSIS — I872 Venous insufficiency (chronic) (peripheral): Secondary | ICD-10-CM | POA: Diagnosis not present

## 2019-12-07 DIAGNOSIS — K5900 Constipation, unspecified: Secondary | ICD-10-CM | POA: Diagnosis not present

## 2019-12-07 DIAGNOSIS — M1990 Unspecified osteoarthritis, unspecified site: Secondary | ICD-10-CM | POA: Diagnosis not present

## 2019-12-07 DIAGNOSIS — K219 Gastro-esophageal reflux disease without esophagitis: Secondary | ICD-10-CM | POA: Diagnosis not present

## 2019-12-07 DIAGNOSIS — E785 Hyperlipidemia, unspecified: Secondary | ICD-10-CM | POA: Diagnosis not present

## 2019-12-07 DIAGNOSIS — G629 Polyneuropathy, unspecified: Secondary | ICD-10-CM | POA: Diagnosis not present

## 2019-12-07 DIAGNOSIS — I10 Essential (primary) hypertension: Secondary | ICD-10-CM | POA: Diagnosis not present

## 2019-12-07 DIAGNOSIS — R2689 Other abnormalities of gait and mobility: Secondary | ICD-10-CM | POA: Diagnosis not present

## 2019-12-08 DIAGNOSIS — I872 Venous insufficiency (chronic) (peripheral): Secondary | ICD-10-CM | POA: Diagnosis not present

## 2019-12-15 ENCOUNTER — Other Ambulatory Visit: Payer: Self-pay | Admitting: Geriatric Medicine

## 2019-12-15 DIAGNOSIS — I872 Venous insufficiency (chronic) (peripheral): Secondary | ICD-10-CM

## 2020-01-06 DIAGNOSIS — M545 Low back pain, unspecified: Secondary | ICD-10-CM | POA: Diagnosis not present

## 2020-01-06 DIAGNOSIS — I872 Venous insufficiency (chronic) (peripheral): Secondary | ICD-10-CM | POA: Diagnosis not present

## 2020-01-06 DIAGNOSIS — I10 Essential (primary) hypertension: Secondary | ICD-10-CM | POA: Diagnosis not present

## 2020-01-08 DIAGNOSIS — I872 Venous insufficiency (chronic) (peripheral): Secondary | ICD-10-CM | POA: Diagnosis not present

## 2020-02-05 DIAGNOSIS — M545 Low back pain, unspecified: Secondary | ICD-10-CM | POA: Diagnosis not present

## 2020-02-05 DIAGNOSIS — I10 Essential (primary) hypertension: Secondary | ICD-10-CM | POA: Diagnosis not present

## 2020-02-05 DIAGNOSIS — I872 Venous insufficiency (chronic) (peripheral): Secondary | ICD-10-CM | POA: Diagnosis not present

## 2020-02-07 DIAGNOSIS — I872 Venous insufficiency (chronic) (peripheral): Secondary | ICD-10-CM | POA: Diagnosis not present

## 2020-02-09 DIAGNOSIS — I872 Venous insufficiency (chronic) (peripheral): Secondary | ICD-10-CM | POA: Diagnosis not present

## 2020-02-09 DIAGNOSIS — I10 Essential (primary) hypertension: Secondary | ICD-10-CM | POA: Diagnosis not present

## 2020-03-07 DIAGNOSIS — I872 Venous insufficiency (chronic) (peripheral): Secondary | ICD-10-CM | POA: Diagnosis not present

## 2020-03-07 DIAGNOSIS — M545 Low back pain, unspecified: Secondary | ICD-10-CM | POA: Diagnosis not present

## 2020-03-07 DIAGNOSIS — I10 Essential (primary) hypertension: Secondary | ICD-10-CM | POA: Diagnosis not present

## 2020-03-09 DIAGNOSIS — I872 Venous insufficiency (chronic) (peripheral): Secondary | ICD-10-CM | POA: Diagnosis not present

## 2020-03-14 DIAGNOSIS — M1612 Unilateral primary osteoarthritis, left hip: Secondary | ICD-10-CM | POA: Diagnosis not present

## 2020-03-14 DIAGNOSIS — M545 Low back pain, unspecified: Secondary | ICD-10-CM | POA: Diagnosis not present

## 2020-04-07 DIAGNOSIS — I872 Venous insufficiency (chronic) (peripheral): Secondary | ICD-10-CM | POA: Diagnosis not present

## 2020-04-07 DIAGNOSIS — I10 Essential (primary) hypertension: Secondary | ICD-10-CM | POA: Diagnosis not present

## 2020-04-07 DIAGNOSIS — M545 Low back pain, unspecified: Secondary | ICD-10-CM | POA: Diagnosis not present

## 2020-04-09 DIAGNOSIS — I872 Venous insufficiency (chronic) (peripheral): Secondary | ICD-10-CM | POA: Diagnosis not present

## 2020-04-23 DIAGNOSIS — B029 Zoster without complications: Secondary | ICD-10-CM | POA: Diagnosis not present

## 2020-04-23 DIAGNOSIS — L03115 Cellulitis of right lower limb: Secondary | ICD-10-CM | POA: Diagnosis not present

## 2020-04-23 DIAGNOSIS — R2243 Localized swelling, mass and lump, lower limb, bilateral: Secondary | ICD-10-CM | POA: Diagnosis not present

## 2020-04-24 DIAGNOSIS — M159 Polyosteoarthritis, unspecified: Secondary | ICD-10-CM | POA: Diagnosis not present

## 2020-04-25 DIAGNOSIS — R2243 Localized swelling, mass and lump, lower limb, bilateral: Secondary | ICD-10-CM | POA: Diagnosis not present

## 2020-04-25 DIAGNOSIS — L03115 Cellulitis of right lower limb: Secondary | ICD-10-CM | POA: Diagnosis not present

## 2020-04-25 DIAGNOSIS — B029 Zoster without complications: Secondary | ICD-10-CM | POA: Diagnosis not present

## 2020-05-01 DIAGNOSIS — R2243 Localized swelling, mass and lump, lower limb, bilateral: Secondary | ICD-10-CM | POA: Diagnosis not present

## 2020-05-01 DIAGNOSIS — B029 Zoster without complications: Secondary | ICD-10-CM | POA: Diagnosis not present

## 2020-05-02 DIAGNOSIS — M6281 Muscle weakness (generalized): Secondary | ICD-10-CM | POA: Diagnosis not present

## 2020-05-03 ENCOUNTER — Inpatient Hospital Stay (HOSPITAL_COMMUNITY)
Admission: EM | Admit: 2020-05-03 | Discharge: 2020-05-11 | DRG: 308 | Disposition: A | Discharge status: Skilled Nursing Facility | Payer: Medicare HMO | Attending: Internal Medicine | Admitting: Internal Medicine

## 2020-05-03 ENCOUNTER — Emergency Department (HOSPITAL_COMMUNITY): Payer: Medicare HMO

## 2020-05-03 ENCOUNTER — Other Ambulatory Visit: Payer: Self-pay

## 2020-05-03 ENCOUNTER — Encounter (HOSPITAL_COMMUNITY): Payer: Self-pay | Admitting: Internal Medicine

## 2020-05-03 DIAGNOSIS — M1990 Unspecified osteoarthritis, unspecified site: Secondary | ICD-10-CM | POA: Diagnosis present

## 2020-05-03 DIAGNOSIS — F419 Anxiety disorder, unspecified: Secondary | ICD-10-CM | POA: Diagnosis not present

## 2020-05-03 DIAGNOSIS — I5041 Acute combined systolic (congestive) and diastolic (congestive) heart failure: Secondary | ICD-10-CM | POA: Diagnosis present

## 2020-05-03 DIAGNOSIS — I890 Lymphedema, not elsewhere classified: Secondary | ICD-10-CM

## 2020-05-03 DIAGNOSIS — T461X5A Adverse effect of calcium-channel blockers, initial encounter: Secondary | ICD-10-CM | POA: Diagnosis not present

## 2020-05-03 DIAGNOSIS — I4892 Unspecified atrial flutter: Secondary | ICD-10-CM | POA: Diagnosis not present

## 2020-05-03 DIAGNOSIS — I952 Hypotension due to drugs: Secondary | ICD-10-CM | POA: Diagnosis not present

## 2020-05-03 DIAGNOSIS — R609 Edema, unspecified: Secondary | ICD-10-CM | POA: Diagnosis not present

## 2020-05-03 DIAGNOSIS — R509 Fever, unspecified: Secondary | ICD-10-CM | POA: Diagnosis not present

## 2020-05-03 DIAGNOSIS — M5442 Lumbago with sciatica, left side: Secondary | ICD-10-CM | POA: Diagnosis present

## 2020-05-03 DIAGNOSIS — I110 Hypertensive heart disease with heart failure: Secondary | ICD-10-CM | POA: Diagnosis present

## 2020-05-03 DIAGNOSIS — I10 Essential (primary) hypertension: Secondary | ICD-10-CM | POA: Diagnosis present

## 2020-05-03 DIAGNOSIS — R54 Age-related physical debility: Secondary | ICD-10-CM | POA: Diagnosis present

## 2020-05-03 DIAGNOSIS — I959 Hypotension, unspecified: Secondary | ICD-10-CM | POA: Diagnosis not present

## 2020-05-03 DIAGNOSIS — E876 Hypokalemia: Secondary | ICD-10-CM | POA: Diagnosis present

## 2020-05-03 DIAGNOSIS — I4891 Unspecified atrial fibrillation: Secondary | ICD-10-CM | POA: Diagnosis present

## 2020-05-03 DIAGNOSIS — Z20822 Contact with and (suspected) exposure to covid-19: Secondary | ICD-10-CM | POA: Diagnosis present

## 2020-05-03 DIAGNOSIS — I081 Rheumatic disorders of both mitral and tricuspid valves: Secondary | ICD-10-CM | POA: Diagnosis present

## 2020-05-03 DIAGNOSIS — Z87442 Personal history of urinary calculi: Secondary | ICD-10-CM

## 2020-05-03 DIAGNOSIS — Z79899 Other long term (current) drug therapy: Secondary | ICD-10-CM

## 2020-05-03 DIAGNOSIS — Z79890 Hormone replacement therapy (postmenopausal): Secondary | ICD-10-CM

## 2020-05-03 DIAGNOSIS — R000 Tachycardia, unspecified: Secondary | ICD-10-CM | POA: Diagnosis not present

## 2020-05-03 DIAGNOSIS — R001 Bradycardia, unspecified: Secondary | ICD-10-CM | POA: Diagnosis not present

## 2020-05-03 DIAGNOSIS — Y92230 Patient room in hospital as the place of occurrence of the external cause: Secondary | ICD-10-CM | POA: Diagnosis not present

## 2020-05-03 DIAGNOSIS — E0590 Thyrotoxicosis, unspecified without thyrotoxic crisis or storm: Secondary | ICD-10-CM | POA: Diagnosis present

## 2020-05-03 DIAGNOSIS — I517 Cardiomegaly: Secondary | ICD-10-CM | POA: Diagnosis not present

## 2020-05-03 DIAGNOSIS — M5387 Other specified dorsopathies, lumbosacral region: Secondary | ICD-10-CM

## 2020-05-03 DIAGNOSIS — R079 Chest pain, unspecified: Secondary | ICD-10-CM | POA: Diagnosis not present

## 2020-05-03 DIAGNOSIS — Z7901 Long term (current) use of anticoagulants: Secondary | ICD-10-CM

## 2020-05-03 DIAGNOSIS — G894 Chronic pain syndrome: Secondary | ICD-10-CM | POA: Diagnosis present

## 2020-05-03 DIAGNOSIS — E782 Mixed hyperlipidemia: Secondary | ICD-10-CM | POA: Diagnosis present

## 2020-05-03 DIAGNOSIS — Z8249 Family history of ischemic heart disease and other diseases of the circulatory system: Secondary | ICD-10-CM

## 2020-05-03 DIAGNOSIS — M5430 Sciatica, unspecified side: Secondary | ICD-10-CM

## 2020-05-03 DIAGNOSIS — E039 Hypothyroidism, unspecified: Secondary | ICD-10-CM | POA: Diagnosis present

## 2020-05-03 DIAGNOSIS — Z6837 Body mass index (BMI) 37.0-37.9, adult: Secondary | ICD-10-CM

## 2020-05-03 HISTORY — DX: Thyrotoxicosis, unspecified without thyrotoxic crisis or storm: E05.90

## 2020-05-03 HISTORY — DX: Sciatica, unspecified side: M54.30

## 2020-05-03 LAB — COMPREHENSIVE METABOLIC PANEL
ALT: 11 U/L (ref 0–44)
AST: 16 U/L (ref 15–41)
Albumin: 3.8 g/dL (ref 3.5–5.0)
Alkaline Phosphatase: 47 U/L (ref 38–126)
Anion gap: 6 (ref 5–15)
BUN: 12 mg/dL (ref 8–23)
CO2: 31 mmol/L (ref 22–32)
Calcium: 9.4 mg/dL (ref 8.9–10.3)
Chloride: 104 mmol/L (ref 98–111)
Creatinine, Ser: 0.91 mg/dL (ref 0.44–1.00)
GFR, Estimated: >60 mL/min (ref >60)
Glucose, Bld: 107 mg/dL — ABNORMAL HIGH (ref 70–99)
Potassium: 4.2 mmol/L (ref 3.5–5.1)
Sodium: 141 mmol/L (ref 135–145)
Total Bilirubin: 0.6 mg/dL (ref 0.3–1.2)
Total Protein: 6 g/dL — ABNORMAL LOW (ref 6.5–8.1)

## 2020-05-03 LAB — CBC
HCT: 46.9 % — ABNORMAL HIGH (ref 36.0–46.0)
Hemoglobin: 14.9 g/dL (ref 12.0–15.0)
MCH: 32.7 pg (ref 26.0–34.0)
MCHC: 31.8 g/dL (ref 30.0–36.0)
MCV: 103.1 fL — ABNORMAL HIGH (ref 80.0–100.0)
Platelets: 161 10*3/uL (ref 150–400)
RBC: 4.55 MIL/uL (ref 3.87–5.11)
RDW: 14.2 % (ref 11.5–15.5)
WBC: 6.9 10*3/uL (ref 4.0–10.5)
nRBC: 0 % (ref 0.0–0.2)

## 2020-05-03 LAB — BRAIN NATRIURETIC PEPTIDE: B Natriuretic Peptide: 242.4 pg/mL — ABNORMAL HIGH (ref 0.0–100.0)

## 2020-05-03 LAB — SARS CORONAVIRUS 2 (TAT 6-24 HRS): SARS Coronavirus 2: NEGATIVE

## 2020-05-03 LAB — TROPONIN I (HIGH SENSITIVITY)
Troponin I (High Sensitivity): 8 ng/L (ref <18)
Troponin I (High Sensitivity): 10 ng/L (ref <18)

## 2020-05-03 LAB — MAGNESIUM: Magnesium: 2.2 mg/dL (ref 1.7–2.4)

## 2020-05-03 LAB — TSH: TSH: 4.92 u[IU]/mL — ABNORMAL HIGH (ref 0.350–4.500)

## 2020-05-03 MED ORDER — DILTIAZEM HCL-DEXTROSE 125-5 MG/125ML-% IV SOLN (PREMIX)
5.0000 mg/h | INTRAVENOUS | Status: DC
  Administered 2020-05-03: 5 mg/h via INTRAVENOUS
  Administered 2020-05-04 – 2020-05-05 (×2): 7.5 mg/h via INTRAVENOUS
  Filled 2020-05-03 (×4): qty 125

## 2020-05-03 MED ORDER — CYCLOBENZAPRINE HCL 10 MG PO TABS
10.0000 mg | ORAL_TABLET | Freq: Three times a day (TID) | ORAL | Status: DC | PRN
  Administered 2020-05-04 – 2020-05-09 (×4): 10 mg via ORAL
  Filled 2020-05-03 (×5): qty 1

## 2020-05-03 MED ORDER — DILTIAZEM LOAD VIA INFUSION
10.0000 mg | Freq: Once | INTRAVENOUS | Status: DC
  Filled 2020-05-03: qty 10

## 2020-05-03 MED ORDER — GABAPENTIN 300 MG PO CAPS
300.0000 mg | ORAL_CAPSULE | Freq: Every morning | ORAL | Status: DC
  Administered 2020-05-04 – 2020-05-11 (×8): 300 mg via ORAL
  Filled 2020-05-03 (×7): qty 1

## 2020-05-03 MED ORDER — SIMVASTATIN 20 MG PO TABS
20.0000 mg | ORAL_TABLET | Freq: Every day | ORAL | Status: DC
  Administered 2020-05-04 – 2020-05-11 (×8): 20 mg via ORAL
  Filled 2020-05-03 (×8): qty 1

## 2020-05-03 MED ORDER — ONDANSETRON HCL 4 MG/2ML IJ SOLN: 4.0000 mg | Freq: Four times a day (QID) | INTRAMUSCULAR | Status: DC | PRN

## 2020-05-03 MED ORDER — KETOROLAC TROMETHAMINE 30 MG/ML IJ SOLN
30.0000 mg | Freq: Once | INTRAMUSCULAR | Status: DC
  Filled 2020-05-03: qty 1

## 2020-05-03 MED ORDER — ENOXAPARIN SODIUM 40 MG/0.4ML ~~LOC~~ SOLN: 40.0000 mg | SUBCUTANEOUS | Status: DC

## 2020-05-03 MED ORDER — POLYETHYLENE GLYCOL 3350 17 G PO PACK
17.0000 g | PACK | Freq: Every day | ORAL | Status: DC | PRN
  Filled 2020-05-03: qty 1

## 2020-05-03 MED ORDER — OXYCODONE HCL 5 MG PO TABS
5.0000 mg | ORAL_TABLET | Freq: Four times a day (QID) | ORAL | Status: DC | PRN
  Administered 2020-05-03 – 2020-05-05 (×5): 5 mg via ORAL
  Filled 2020-05-03 (×5): qty 1

## 2020-05-03 MED ORDER — METOPROLOL TARTRATE 5 MG/5ML IV SOLN
2.5000 mg | Freq: Once | INTRAVENOUS | Status: AC
  Administered 2020-05-03: 2.5 mg via INTRAVENOUS
  Filled 2020-05-03: qty 5

## 2020-05-03 MED ORDER — APIXABAN 5 MG PO TABS
5.0000 mg | ORAL_TABLET | Freq: Two times a day (BID) | ORAL | Status: DC
  Administered 2020-05-03 – 2020-05-11 (×16): 5 mg via ORAL
  Filled 2020-05-03 (×16): qty 1

## 2020-05-03 MED ORDER — ACETAMINOPHEN 325 MG PO TABS
650.0000 mg | ORAL_TABLET | Freq: Once | ORAL | Status: AC
  Administered 2020-05-03: 650 mg via ORAL
  Filled 2020-05-03: qty 2

## 2020-05-03 MED ORDER — ACETAMINOPHEN 325 MG PO TABS
650.0000 mg | ORAL_TABLET | ORAL | Status: DC | PRN
  Administered 2020-05-04 – 2020-05-11 (×9): 650 mg via ORAL
  Filled 2020-05-03 (×9): qty 2

## 2020-05-03 MED ORDER — HYDROCHLOROTHIAZIDE 25 MG PO TABS: 25.0000 mg | ORAL_TABLET | Freq: Every day | ORAL | Status: DC

## 2020-05-03 MED ORDER — FUROSEMIDE 10 MG/ML IJ SOLN
20.0000 mg | Freq: Two times a day (BID) | INTRAMUSCULAR | Status: DC
  Administered 2020-05-03 – 2020-05-04 (×2): 20 mg via INTRAVENOUS
  Filled 2020-05-03 (×2): qty 2

## 2020-05-03 MED ORDER — LISINOPRIL 10 MG PO TABS
10.0000 mg | ORAL_TABLET | Freq: Every day | ORAL | Status: DC
  Administered 2020-05-04: 10 mg via ORAL
  Filled 2020-05-03: qty 1

## 2020-05-03 MED ORDER — ADENOSINE 6 MG/2ML IV SOLN
6.0000 mg | Freq: Once | INTRAVENOUS | Status: AC
  Administered 2020-05-03: 6 mg via INTRAVENOUS

## 2020-05-03 MED ORDER — GABAPENTIN 300 MG PO CAPS
600.0000 mg | ORAL_CAPSULE | Freq: Every day | ORAL | Status: DC
  Administered 2020-05-03 – 2020-05-10 (×8): 600 mg via ORAL
  Filled 2020-05-03 (×9): qty 2

## 2020-05-03 NOTE — H&P (Signed)
History and Physical    Kim Orr:[id number] DOB: [birth year] DOA: 05/03/2020  PCP: Lajean Manes, MD  Patient coming from: PCP office via EMS   Chief Complaint:  Chief Complaint  Patient presents with   Tachycardia     HPI:    80 year old female with past medical history of hyperthyroidism, chronic pain syndrome secondary to chronic low back pain with sciatica, hypertension, lymphedema and hyperlipidemia who presents to [facility] emergency department via EMS at the direction of her primary care provider due to a new onset tachyarrhythmia.  Patient explains that for approximately the past 2 months she has noticed that her chronic leg edema that she typically has been suffering from due to her lymphedema has progressively worsened.  This continue to worsen until approximately 1 week ago when she began to develop painful blisters, over the anterior surface of her right leg.  Patient denies any associated chest pain, palpitations, paroxysmal nocturnal dyspnea, pillow orthopnea or dyspnea on exertion with the symptoms.  Patient denies cough or fevers.  Patient does report that she is on daily Lasix therapy for her known history of lymphedema and has been compliant with this medication.  Furthermore, patient has a known history of hyperthyroidism and is on methimazole and has been compliant with this as well.  Patient eventually presented to see her primary care provider earlier today for a regularly scheduled appointment where it was noted the patient was quite tachycardic on arrival and EKG revealed the patient was suffering from a rapid tachyarrhythmia.  Patient was sent to [facility] emergency department for evaluation via EMS.  Upon evaluation in the emergency department review of initial EKG was concerning for atrial flutter.  Performance of carotid massage was unsuccessful.  Furthermore patient was given 2 separate doses of intravenous metoprolol without  resolution of the arrhythmia or improvement in the rate.  Patient was then placed on a diltiazem infusion and the hospitalist group was then called to assess the patient for admission to the hospital.  Review of Systems:   Review of Systems  Cardiovascular: Positive for leg swelling.  All other systems reviewed and are negative.   Past Medical History:  Diagnosis Date   Essential hypertension 07/01/2017   Hemorrhoids 03/16/2017   History of nephrolithiasis 04/14/2016   Hyperlipidemia 07/01/2017   Hyperthyroidism 05/03/2020   Multinodular goiter 06/01/2018   Formatting of this note is different from the original. 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  Risk of malignancy 5-15%    Osteoarthritis 07/01/2017   Sciatica 05/03/2020   Status post cholecystectomy 03/16/2017   Formatting of this note might be different from the original. Robotic cholecystectomy performed 1/30 @ [place] of this note might be different from the original. Robotic cholecystectomy performed 1/30 @ [facility]    History reviewed. No pertinent surgical history.   reports that she has never smoked. She has never used smokeless tobacco. No history on file for alcohol use and drug use.  Not on File  Family History  Problem Relation Age of Onset   Breast cancer Mother    Heart disease Father      Prior to Admission medications   Not on File    Physical Exam: Vitals:   05/03/20 1830 05/03/20 1839 05/03/20 1945 05/03/20 2035  BP: (!) 126/99  109/84 (!) 132/94  Pulse: (!) 153  (!) 146 (!) 144  Resp: 14  18 (!) 21  Temp:  TempSrc:      SpO2: 100%  97% 98%  Weight:  117.9 kg    Height:  5\' 11"  (1.803 m)      Constitutional: Awake alert and oriented x3, no associated distress.  Patient is obese. Skin: Notable hyperemia of the anterior surfaces of the bilateral lower extremities with notable blister formation over the anterior surface of the  right lower extremity.  No other lesions or rashes noted.  Good skin turgor.   Eyes: Pupils are equally reactive to light.  No evidence of scleral icterus or conjunctival pallor.  ENMT: Moist mucous membranes noted.  Posterior pharynx clear of any exudate or lesions.   Neck: normal, supple, no masses, no thyromegaly.  No evidence of jugular venous distension.   Respiratory: Mild bibasilar rales.  No evidence of wheezing. Normal respiratory effort. No accessory muscle use.  Cardiovascular: Tachycardic rate and irregularly irregular rhythm. no murmurs / rubs / gallops. No extremity edema.  Extensive pitting edema of the bilateral lower extremities that tracks up to the thighs.  No carotid bruits.  Chest:   Nontender without crepitus or deformity.  Back:   Nontender without crepitus or deformity. Abdomen: Abdomen is soft and nontender.  No evidence of intra-abdominal masses.  Positive bowel sounds noted in all quadrants.   Musculoskeletal: Skin examination findings of the bilateral lower extremities as noted above.  No joint deformity upper and lower extremities. Good ROM, no contractures. Normal muscle tone.  Neurologic: CN 2-12 grossly intact. Sensation intact.  Patient moving all 4 extremities spontaneously.  Patient is following all commands.  Patient is responsive to verbal stimuli.   Psychiatric: Patient exhibits normal mood with appropriate affect.  Patient seems to possess insight as to their current situation.     Labs on Admission: I have personally reviewed following labs and imaging studies -   CBC: Recent Labs  Lab 05/03/20 1744  WBC 6.9  HGB 14.9  HCT 46.9*  MCV 103.1*  PLT 295   Basic Metabolic Panel: Recent Labs  Lab 05/03/20 1751  NA 141  K 4.2  CL 104  CO2 31  GLUCOSE 107*  BUN 12  CREATININE 0.91  CALCIUM 9.4  MG 2.2   GFR: Estimated Creatinine Clearance: 69.7 mL/min (by C-G formula based on SCr of 0.91 mg/dL). Liver Function Tests: Recent Labs  Lab  05/03/20 1751  AST 16  ALT 11  ALKPHOS 47  BILITOT 0.6  PROT 6.0*  ALBUMIN 3.8   No results for input(s): LIPASE, AMYLASE in the last 168 hours. No results for input(s): AMMONIA in the last 168 hours. Coagulation Profile: No results for input(s): INR, PROTIME in the last 168 hours. Cardiac Enzymes: No results for input(s): CKTOTAL, CKMB, CKMBINDEX, TROPONINI in the last 168 hours. BNP (last 3 results) No results for input(s): PROBNP in the last 8760 hours. HbA1C: No results for input(s): HGBA1C in the last 72 hours. CBG: No results for input(s): GLUCAP in the last 168 hours. Lipid Profile: No results for input(s): CHOL, HDL, LDLCALC, TRIG, CHOLHDL, LDLDIRECT in the last 72 hours. Thyroid Function Tests: Recent Labs    05/03/20 1751  TSH 4.920*   Anemia Panel: No results for input(s): VITAMINB12, FOLATE, FERRITIN, TIBC, IRON, RETICCTPCT in the last 72 hours. Urine analysis: No results found for: COLORURINE, APPEARANCEUR, LABSPEC, PHURINE, GLUCOSEU, HGBUR, BILIRUBINUR, KETONESUR, PROTEINUR, UROBILINOGEN, NITRITE, LEUKOCYTESUR  Radiological Exams on Admission - Personally Reviewed: DG Chest Portable 1 View  Result Date: 05/03/2020 CLINICAL DATA:  Chest pain EXAM:  PORTABLE CHEST 1 VIEW COMPARISON:  None. FINDINGS: Cardiomegaly with vascular congestion. Increased markings in the lung bases could reflect atelectasis or early edema. No effusions. No acute bony abnormality. IMPRESSION: Cardiomegaly, vascular congestion. Increased markings in the bases could reflect atelectasis or early edema. Electronically Signed   By: Rolm Baptise M.D.   On: 05/03/2020 18:55    EKG: Personally reviewed.  Rhythm is wide-complex tachycardia with heart rate of 151 bpm.  No dynamic ST segment changes appreciated.  Telemetry: Review of telemetry strip after administering adenosine reveals atrial fibrillation before patient resumes rapid atrial fibrillation into the 150s.  Assessment/Plan Principal  Problem:   Atrial fibrillation and flutter [facility])   Patient presents to the emergency department after patient was found to incidentally be in rapid tachycardia by her outpatient provider with the only associated symptom being increased peripheral edema compared to her baseline due to lymphedema.  While initial EKG appeared to possibly be atrial flutter, after administration of adenosine in the emergency department patient appears to be in atrial fibrillation  Patient is failed to achieve rate control after several doses of intravenous metoprolol by the emergency department staff.  Patient has now been transitioned to diltiazem infusion.  Due to significant peripheral edema beyond her baseline as well as evidence of some pulmonary edema on chest x-ray place patient on a short course of intravenous Lasix at 20 mg twice daily for 3 doses followed by resumption of patient's usual home regimen of 20 mg p.o. daily.  Troponins not elevated.  TSH is near target while being on methimazole.  D-dimer pending.  Magnesium within normal limits.  Echocardiogram ordered for the morning  Placing patient in progressive unit for continued titration of diltiazem infusion  After discussion of the risks and benefits patient has consented to initiation of Eliquis for prevention of thromboembolic complications -patient possesses a CHA2DS2-VASc score of 4.  Will consider cardiology consultation in the morning if rate control is unable to be achieved with diltiazem infusion.  Active Problems:   Essential hypertension  Resume patients home regimen or oral antihypertensives  Titrate antihypertensive regimen as necessary to achieve adequate BP control  PRN intravenous antihypertensives for excessively elevated blood pressure    Hyperthyroidism   Patient is a longstanding known history of hyperthyroidism  Patient is compliant with methimazole which will be resumed during this hospitalization  TSH is near  target, I do not believe that patient's hypothyroidism is playing a role in this arrhythmia.    Sciatica of left side associated with disorder of lumbosacral spine   Trial dose of intravenous Toradol administered  As needed oxycodone per home regimen  Continue home regimen of as needed Flexeril, gabapentin twice daily    Mixed hyperlipidemia   Continuing home regimen of lipid lowering therapy.    Lymphedema of both lower extremities   As mentioned above, edema of the lower extremities is worsened beyond baseline likely exacerbated by ongoing atrial fibrillation/flutter  Administering intravenous Lasix as blood pressure tolerates  Patient would benefit from outpatient follow-up with the lymphedema clinic   Code Status:  Full code Family Communication: deferred   Status is: Observation  The patient remains OBS appropriate and will d/c before 2 midnights.  Dispo: The patient is from: [place]              Anticipated d/c is to: Independent Living              Patient currently is not medically stable to d/c.  Difficult to place patient No        Vernelle Emerald MD [facility] Pager [phone]  If 7PM-7AM, please contact night-coverage [email or web address] Use universal [place] password for that web site. If you do not have the password, please call the hospital operator.  05/03/2020, 9:17 PM

## 2020-05-03 NOTE — Progress Notes (Signed)
   05/03/20 2121  Assess: MEWS Score  Temp 98 F (36.7 C)  BP 98/73  Pulse Rate 99  ECG Heart Rate 99  Resp 12  SpO2 97 %  Assess: MEWS Score  MEWS Temp 0  MEWS Systolic 1  MEWS Pulse 0  MEWS RR 1  MEWS LOC 0  MEWS Score 2  MEWS Score Color Yellow  Assess: if the MEWS score is Yellow or Red  Were vital signs taken at a resting state? Yes  Focused Assessment No change from prior assessment  Early Detection of Sepsis Score *See Row Information* Low  MEWS guidelines implemented *See Row Information* Yes  Treat  MEWS Interventions Administered scheduled meds/treatments  Pain Scale 0-10  Pain Score 7  Pain Type Chronic pain  Pain Location Leg  Pain Orientation Left  Pain Descriptors / Indicators Aching  Pain Onset On-going  Patients Stated Pain Goal 0  Pain Intervention(s) Medication (See eMAR)  Take Vital Signs  Increase Vital Sign Frequency  Yellow: Q 2hr X 2 then Q 4hr X 2, if remains yellow, continue Q 4hrs  Escalate  MEWS: Escalate Yellow: discuss with charge nurse/RN and consider discussing with provider and RRT  Notify: Charge Nurse/RN  Name of Charge Nurse/RN Notified Heather, RN  Date Charge Nurse/RN Notified 05/03/20  Time Charge Nurse/RN Notified 2150  Document  Patient Outcome Other (Comment) (Cardizem gtt increased to 10)  Progress note created (see row info) Yes

## 2020-05-03 NOTE — Progress Notes (Signed)
ANTICOAGULATION CONSULT NOTE - Initial Consult  Pharmacy Consult for apixaban Indication: atrial fibrillation  Not on File  Patient Measurements: Height: 5\' 11"  (180.3 cm) Weight: 128.1 kg (282 lb 6.6 oz) IBW/kg (Calculated) : 70.8   Vital Signs: Temp: 98 F (36.7 C) (03/24 2121) Temp Source: Oral (03/24 2121) BP: 98/73 (03/24 2121) Pulse Rate: 99 (03/24 2121)  Labs: Recent Labs    05/03/20 1744 05/03/20 1751 05/03/20 1950  HGB 14.9  --   --   HCT 46.9*  --   --   PLT 161  --   --   CREATININE  --  0.91  --   TROPONINIHS  --  8 10    Estimated Creatinine Clearance: 72.9 mL/min (by C-G formula based on SCr of 0.91 mg/dL).   Medical History: Past Medical History:  Diagnosis Date  . Essential hypertension 07/01/2017  . Hemorrhoids 03/16/2017  . History of nephrolithiasis 04/14/2016  . Hyperlipidemia 07/01/2017  . Hyperthyroidism 05/03/2020  . Multinodular goiter 06/01/2018   Formatting of this note is different from the original. 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  Risk of malignancy 5-15%   . Osteoarthritis 07/01/2017  . Sciatica 05/03/2020  . Status post cholecystectomy 03/16/2017   Formatting of this note might be different from the original. Robotic cholecystectomy performed 1/30 @ [place] of this note might be different from the original. Robotic cholecystectomy performed 1/30 @ [facility]     Assessment: 80yof admitted with new Afib started on diltiamzem drip for rate control/  CBC ok Cr 1  Start apixaban for stroke prophylaxis.   Age 80 but Cr < 1.5 and wt > 60kg   Goal of Therapy:   Monitor platelets by anticoagulation protocol: Yes   Plan:  Apixaban 5mg  BID Monitor s/s bleeding and renal function   Bonnita Nasuti Pharm.D. CPP, BCPS Clinical Pharmacist [phone] 05/03/2020 9:44 PM

## 2020-05-03 NOTE — ED Triage Notes (Addendum)
Pt arrived via [facility] from [facility]. Nurse pratictioner sent her for tachycardia consistently 154. Pt is A&Ox4. Pt is in A-flutter on monitor. Pt is tachy in the 150's

## 2020-05-03 NOTE — ED Provider Notes (Signed)
[place] EMERGENCY DEPARTMENT Provider Note   CSN: [id number] Arrival date & time: 05/03/20  1734     History Chief Complaint  Patient presents with  . Tachycardia    Kim Orr is a 80 y.o. female.  The history is provided by the patient.   Kim Orr is a 80 y.o. female who presents to the Emergency Department complaining of tachycardia. She presents the emergency department by EMS from [facility] independent living for evaluation of tachycardia noticed today. She was being seen by the nurse practitioner there who noted that her heart rate was high. She states that she has been evaluated repeatedly since 11 o'clock and since her heart rate continued to be high she was referred to the emergency department. She denies any current symptoms. She does have chronic lower extremity edema and states that this is slightly worse than baseline. She denies any fevers, chest pain, shortness of breath, nausea, vomiting, diarrhea. She does have a history of thyroid disease and takes methimazole. No history of arrhythmia or cardiac disease. She lives with her husband. She is fully vaccinated and boosted for COVID-19.    Past Medical History:  Diagnosis Date  . Essential hypertension 07/01/2017  . Hemorrhoids 03/16/2017  . History of nephrolithiasis 04/14/2016  . Hyperlipidemia 07/01/2017  . Hyperthyroidism 05/03/2020  . Multinodular goiter 06/01/2018   Formatting of this note is different from the original. 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  Risk of malignancy 5-15%   . Osteoarthritis 07/01/2017  . Sciatica 05/03/2020  . Status post cholecystectomy 03/16/2017   Formatting of this note might be different from the original. Robotic cholecystectomy performed 1/30 @ [place] of this note might be different from the original. Robotic cholecystectomy performed 1/30 @ [facility]    Patient Active Problem List    Diagnosis Date Noted  . Atrial flutter with rapid ventricular response ([place]) 05/03/2020  . Hyperthyroidism 05/03/2020  . Sciatica 05/03/2020  . Multinodular goiter 06/01/2018  . Essential hypertension 07/01/2017  . Hyperlipidemia 07/01/2017  . Osteoarthritis 07/01/2017  . Hemorrhoids 03/16/2017    PMHX  hyperthyroidism OB History   No obstetric history on file.     No family history on file.     Home Medications Prior to Admission medications   Not on File    Allergies    Patient has no allergy information on record.  Review of Systems   Review of Systems  All other systems reviewed and are negative.   Physical Exam Updated Vital Signs BP 109/84   Pulse (!) 146   Temp 98.5 F (36.9 C) (Oral)   Resp 18   Ht 5\' 11"  (1.803 m)   Wt 117.9 kg   SpO2 97%   BMI 36.26 kg/m   Physical Exam Vitals and nursing note reviewed.  Constitutional:      Appearance: She is well-developed.  HENT:     Head: Normocephalic and atraumatic.  Cardiovascular:     Rate and Rhythm: Regular rhythm. Tachycardia present.     Heart sounds: No murmur heard.   Pulmonary:     Effort: Pulmonary effort is normal. No respiratory distress.     Breath sounds: Normal breath sounds.  Abdominal:     Palpations: Abdomen is soft.     Tenderness: There is no abdominal tenderness. There is no guarding or rebound.  Musculoskeletal:        General: Swelling present. No tenderness.  Comments: 4+pitting edema to BLE  Skin:    General: Skin is warm and dry.  Neurological:     Mental Status: She is alert and oriented to person, place, and time.  Psychiatric:        Behavior: Behavior normal.     ED Results / Procedures / Treatments   Labs (all labs ordered are listed, but only abnormal results are displayed) Labs Reviewed  CBC - Abnormal; Notable for the following components:      Result Value   HCT 46.9 (*)    MCV 103.1 (*)    All other components within normal limits   COMPREHENSIVE METABOLIC PANEL - Abnormal; Notable for the following components:   Glucose, Bld 107 (*)    Total Protein 6.0 (*)    All other components within normal limits  BRAIN NATRIURETIC PEPTIDE - Abnormal; Notable for the following components:   B Natriuretic Peptide 242.4 (*)    All other components within normal limits  TSH - Abnormal; Notable for the following components:   TSH 4.920 (*)    All other components within normal limits  SARS CORONAVIRUS 2 (TAT 6-24 HRS)  MAGNESIUM  MAGNESIUM  COMPREHENSIVE METABOLIC PANEL  CBC WITH DIFFERENTIAL/PLATELET  TROPONIN I (HIGH SENSITIVITY)  TROPONIN I (HIGH SENSITIVITY)    EKG None  Radiology DG Chest Portable 1 View  Result Date: 05/03/2020 CLINICAL DATA:  Chest pain EXAM: PORTABLE CHEST 1 VIEW COMPARISON:  None. FINDINGS: Cardiomegaly with vascular congestion. Increased markings in the lung bases could reflect atelectasis or early edema. No effusions. No acute bony abnormality. IMPRESSION: Cardiomegaly, vascular congestion. Increased markings in the bases could reflect atelectasis or early edema. Electronically Signed   By: Rolm Baptise M.D.   On: 05/03/2020 18:55    Procedures Procedures  CRITICAL CARE Performed by: Quintella Reichert   Total critical care time: 35 minutes  Critical care time was exclusive of separately billable procedures and treating other patients.  Critical care was necessary to treat or prevent imminent or life-threatening deterioration.  Critical care was time spent personally by me on the following activities: development of treatment plan with patient and/or surrogate as well as nursing, discussions with consultants, evaluation of patient's response to treatment, examination of patient, obtaining history from patient or surrogate, ordering and performing treatments and interventions, ordering and review of laboratory studies, ordering and review of radiographic studies, pulse oximetry and re-evaluation  of patient's condition.  Medications Ordered in ED Medications  diltiazem (CARDIZEM) 125 mg in dextrose 5% 125 mL (1 mg/mL) infusion (5 mg/hr Intravenous New Bag/Given 05/03/20 1946)  acetaminophen (TYLENOL) tablet 650 mg (has no administration in time range)  ondansetron (ZOFRAN) injection 4 mg (has no administration in time range)  enoxaparin (LOVENOX) injection 40 mg (has no administration in time range)  polyethylene glycol (MIRALAX / GLYCOLAX) packet 17 g (has no administration in time range)  metoprolol tartrate (LOPRESSOR) injection 2.5 mg (2.5 mg Intravenous Given 05/03/20 1839)  metoprolol tartrate (LOPRESSOR) injection 2.5 mg (2.5 mg Intravenous Given 05/03/20 1912)  acetaminophen (TYLENOL) tablet 650 mg (650 mg Oral Given 05/03/20 1938)    ED Course  I have reviewed the triage vital signs and the nursing notes.  Pertinent labs & imaging results that were available during my care of the patient were reviewed by me and considered in my medical decision making (see chart for details).    MDM Rules/Calculators/A&P  patient referred to the emergency department for tachycardia. She is found to be in atrial flutter with rapid ventricular response. No history of prior. Unclear how long she has been in this rhythm. She does report chronic lower extremity edema, which is slightly worse compared to baseline. She was treated with metoprolol IV times two with no significant change in rhythm. Will start Cardizem drip. Hospitalist consulted for admission for ongoing treatment.  Final Clinical Impression(s) / ED Diagnoses Final diagnoses:  Atrial flutter with rapid ventricular response [facility])    Rx / DC Orders ED Discharge Orders    None       Quintella Reichert, MD 05/03/20 2018

## 2020-05-04 ENCOUNTER — Observation Stay (HOSPITAL_COMMUNITY): Payer: Medicare HMO

## 2020-05-04 DIAGNOSIS — I110 Hypertensive heart disease with heart failure: Secondary | ICD-10-CM | POA: Diagnosis not present

## 2020-05-04 DIAGNOSIS — E782 Mixed hyperlipidemia: Secondary | ICD-10-CM | POA: Diagnosis not present

## 2020-05-04 DIAGNOSIS — I4891 Unspecified atrial fibrillation: Secondary | ICD-10-CM | POA: Diagnosis present

## 2020-05-04 DIAGNOSIS — I872 Venous insufficiency (chronic) (peripheral): Secondary | ICD-10-CM | POA: Diagnosis not present

## 2020-05-04 DIAGNOSIS — I081 Rheumatic disorders of both mitral and tricuspid valves: Secondary | ICD-10-CM | POA: Diagnosis present

## 2020-05-04 DIAGNOSIS — Z79890 Hormone replacement therapy (postmenopausal): Secondary | ICD-10-CM | POA: Diagnosis not present

## 2020-05-04 DIAGNOSIS — Z7901 Long term (current) use of anticoagulants: Secondary | ICD-10-CM | POA: Diagnosis not present

## 2020-05-04 DIAGNOSIS — G894 Chronic pain syndrome: Secondary | ICD-10-CM | POA: Diagnosis present

## 2020-05-04 DIAGNOSIS — E876 Hypokalemia: Secondary | ICD-10-CM | POA: Diagnosis not present

## 2020-05-04 DIAGNOSIS — I10 Essential (primary) hypertension: Secondary | ICD-10-CM | POA: Diagnosis not present

## 2020-05-04 DIAGNOSIS — I890 Lymphedema, not elsewhere classified: Secondary | ICD-10-CM | POA: Diagnosis not present

## 2020-05-04 DIAGNOSIS — Z79899 Other long term (current) drug therapy: Secondary | ICD-10-CM | POA: Diagnosis not present

## 2020-05-04 DIAGNOSIS — I4892 Unspecified atrial flutter: Secondary | ICD-10-CM | POA: Diagnosis not present

## 2020-05-04 DIAGNOSIS — M1990 Unspecified osteoarthritis, unspecified site: Secondary | ICD-10-CM | POA: Diagnosis present

## 2020-05-04 DIAGNOSIS — I340 Nonrheumatic mitral (valve) insufficiency: Secondary | ICD-10-CM | POA: Diagnosis not present

## 2020-05-04 DIAGNOSIS — T461X5A Adverse effect of calcium-channel blockers, initial encounter: Secondary | ICD-10-CM | POA: Diagnosis not present

## 2020-05-04 DIAGNOSIS — I361 Nonrheumatic tricuspid (valve) insufficiency: Secondary | ICD-10-CM | POA: Diagnosis not present

## 2020-05-04 DIAGNOSIS — M545 Low back pain, unspecified: Secondary | ICD-10-CM | POA: Diagnosis not present

## 2020-05-04 DIAGNOSIS — I952 Hypotension due to drugs: Secondary | ICD-10-CM | POA: Diagnosis not present

## 2020-05-04 DIAGNOSIS — Y92230 Patient room in hospital as the place of occurrence of the external cause: Secondary | ICD-10-CM | POA: Diagnosis not present

## 2020-05-04 DIAGNOSIS — I959 Hypotension, unspecified: Secondary | ICD-10-CM

## 2020-05-04 DIAGNOSIS — E0590 Thyrotoxicosis, unspecified without thyrotoxic crisis or storm: Secondary | ICD-10-CM

## 2020-05-04 DIAGNOSIS — R54 Age-related physical debility: Secondary | ICD-10-CM | POA: Diagnosis present

## 2020-05-04 DIAGNOSIS — M5442 Lumbago with sciatica, left side: Secondary | ICD-10-CM | POA: Diagnosis present

## 2020-05-04 DIAGNOSIS — Z6837 Body mass index (BMI) 37.0-37.9, adult: Secondary | ICD-10-CM | POA: Diagnosis not present

## 2020-05-04 DIAGNOSIS — Z20822 Contact with and (suspected) exposure to covid-19: Secondary | ICD-10-CM | POA: Diagnosis not present

## 2020-05-04 DIAGNOSIS — E039 Hypothyroidism, unspecified: Secondary | ICD-10-CM | POA: Diagnosis not present

## 2020-05-04 DIAGNOSIS — F419 Anxiety disorder, unspecified: Secondary | ICD-10-CM | POA: Diagnosis not present

## 2020-05-04 DIAGNOSIS — R509 Fever, unspecified: Secondary | ICD-10-CM | POA: Diagnosis not present

## 2020-05-04 DIAGNOSIS — I5041 Acute combined systolic (congestive) and diastolic (congestive) heart failure: Secondary | ICD-10-CM | POA: Diagnosis not present

## 2020-05-04 LAB — CBC WITH DIFFERENTIAL/PLATELET
Abs Immature Granulocytes: 0.02 10*3/uL (ref 0.00–0.07)
Basophils Absolute: 0.1 10*3/uL (ref 0.0–0.1)
Basophils Relative: 1 %
Eosinophils Absolute: 0.1 10*3/uL (ref 0.0–0.5)
Eosinophils Relative: 1 %
HCT: 42.3 % (ref 36.0–46.0)
Hemoglobin: 14 g/dL (ref 12.0–15.0)
Immature Granulocytes: 0 %
Lymphocytes Relative: 22 %
Lymphs Abs: 1.8 10*3/uL (ref 0.7–4.0)
MCH: 33.5 pg (ref 26.0–34.0)
MCHC: 33.1 g/dL (ref 30.0–36.0)
MCV: 101.2 fL — ABNORMAL HIGH (ref 80.0–100.0)
Monocytes Absolute: 0.6 10*3/uL (ref 0.1–1.0)
Monocytes Relative: 7 %
Neutro Abs: 5.9 10*3/uL (ref 1.7–7.7)
Neutrophils Relative %: 69 %
Platelets: 148 10*3/uL — ABNORMAL LOW (ref 150–400)
RBC: 4.18 MIL/uL (ref 3.87–5.11)
RDW: 14.2 % (ref 11.5–15.5)
WBC: 8.5 10*3/uL (ref 4.0–10.5)
nRBC: 0 % (ref 0.0–0.2)

## 2020-05-04 LAB — ECHOCARDIOGRAM COMPLETE
AR max vel: 2.59 cm2
AV Area VTI: 2.42 cm2
AV Area mean vel: 2.28 cm2
AV Mean grad: 5 mmHg
AV Peak grad: 7.5 mmHg
Ao pk vel: 1.37 m/s
Height: 71 in
S' Lateral: 3.4 cm
Weight: 4522.08 oz

## 2020-05-04 LAB — COMPREHENSIVE METABOLIC PANEL
ALT: 10 U/L (ref 0–44)
AST: 15 U/L (ref 15–41)
Albumin: 3.3 g/dL — ABNORMAL LOW (ref 3.5–5.0)
Alkaline Phosphatase: 42 U/L (ref 38–126)
Anion gap: 4 — ABNORMAL LOW (ref 5–15)
BUN: 12 mg/dL (ref 8–23)
CO2: 33 mmol/L — ABNORMAL HIGH (ref 22–32)
Calcium: 8.6 mg/dL — ABNORMAL LOW (ref 8.9–10.3)
Chloride: 103 mmol/L (ref 98–111)
Creatinine, Ser: 0.93 mg/dL (ref 0.44–1.00)
GFR, Estimated: >60 mL/min (ref >60)
Glucose, Bld: 124 mg/dL — ABNORMAL HIGH (ref 70–99)
Potassium: 3.1 mmol/L — ABNORMAL LOW (ref 3.5–5.1)
Sodium: 140 mmol/L (ref 135–145)
Total Bilirubin: 0.6 mg/dL (ref 0.3–1.2)
Total Protein: 5.6 g/dL — ABNORMAL LOW (ref 6.5–8.1)

## 2020-05-04 LAB — D-DIMER, QUANTITATIVE: D-Dimer, Quant: 0.52 ug/mL-FEU — ABNORMAL HIGH (ref 0.00–0.50)

## 2020-05-04 LAB — MAGNESIUM: Magnesium: 2 mg/dL (ref 1.7–2.4)

## 2020-05-04 LAB — POTASSIUM: Potassium: 3.6 mmol/L (ref 3.5–5.1)

## 2020-05-04 MED ORDER — PERFLUTREN LIPID MICROSPHERE
1.0000 mL | INTRAVENOUS | Status: AC | PRN
  Administered 2020-05-04: 2 mL via INTRAVENOUS
  Filled 2020-05-04: qty 10

## 2020-05-04 MED ORDER — METHIMAZOLE 5 MG PO TABS
5.0000 mg | ORAL_TABLET | Freq: Every day | ORAL | Status: DC
  Administered 2020-05-04 – 2020-05-11 (×8): 5 mg via ORAL
  Filled 2020-05-04 (×8): qty 1

## 2020-05-04 MED ORDER — FUROSEMIDE 10 MG/ML IJ SOLN
20.0000 mg | Freq: Two times a day (BID) | INTRAMUSCULAR | Status: DC
  Administered 2020-05-04 – 2020-05-07 (×7): 20 mg via INTRAVENOUS
  Filled 2020-05-04 (×9): qty 2

## 2020-05-04 MED ORDER — POTASSIUM CHLORIDE CRYS ER 20 MEQ PO TBCR
40.0000 meq | EXTENDED_RELEASE_TABLET | ORAL | Status: AC
  Administered 2020-05-04 (×2): 40 meq via ORAL
  Filled 2020-05-04 (×2): qty 2

## 2020-05-04 NOTE — Progress Notes (Signed)
PROGRESS NOTE    Kim Orr  JHE:[id number] DOB: [birth year] DOA: 05/03/2020 PCP: Lajean Manes, MD   Chief Complaint  Patient presents with  . Tachycardia  Brief Narrative: 80 year old female history of hypothyroidism on methimazole, chronic pain syndrome secondary to chronic low back pain sciatica, hypertension, lymphedema, hyperlipidemia sent to ED by primary care doctor for new onset tachyarrhythmia.  Subjective: Seen/examined on cardizem gtt ay 7.5 mg, bp in 90-100s. Leg edematous, on RA. No complaints Patient resting comfortably  Assessment & Plan:  Atrial fibrillation and flutter with rapid ventricular rate, new onset: No palpitation or shortness of breath but having worsening leg edema. Cont on Cardizem drip.  TSH near target, on methimazole.  Troponin negative x2 at 8-10, D-dimer at 0.52 corrected for age is normal.  Will consult cardiology, patient has been started on Eliquis overnight due to CHA2DS2-VASc at least 4, Filed Weights   05/03/20 1839 05/03/20 2121 05/04/20 0300  Weight: 117.9 kg 128.1 kg 128.2 kg   Peripheral edema chronic with some worsening past week- baseline wt 260lb in December, currently 128kg/282 lb. Chest x-ray with some pulmonary edema: BNP borderline to 242, placed on IV Lasix x 3 doses-we will continue on IV Lasix every 12 hours for now,, follow-up echocardiogram.  Rule out CHF. Suspect due to patient's A. Fib  Hypokalemia being repleted, recheck potassium this afternoon  Essential hypertension: BP is controlled on lisinopril.  Hyperthyroidism on methimazole TSH near target.  Chronic low back pain/sciatica/chronic pain continue as needed oxycodone home regimen, muscle relaxant Neurontin  Mixed hyperlipidemia: Continue Zocor  Morbid obesity BMI 39: Will benefit with PCP follow-up, weight loss   Deconditioning she will need PT OT evaluations once heart rate is better controlled.  Currently on hold of as she is on Cardizem drip  Diet  Order            Diet Heart Room service appropriate? Yes; Fluid consistency: Thin  Diet effective now               Patient's Body mass index is 39.42 kg/m.  DVT prophylaxis: eliquis Code Status:   Code Status: Full Code  Family Communication: plan of care discussed with patient at bedside. She reports she has been updating her daughter who is a nurse  Status is: Admitted as observation Patient remains hospitalized for ongoing management of A. fib new onset with RVR, worsening leg edema with IV Lasix and further work-up with cardiology.  Dispo: The patient is from: Home              Anticipated d/c is to: Home              Patient currently is not medically stable to d/c.   Difficult to place patient No    Unresulted Labs (From admission, onward)          Start     Ordered   05/04/20 1200  Potassium  Once,   R        05/04/20 0758           Medications reviewed:  Scheduled Meds: . apixaban  5 mg Oral BID  . diltiazem  10 mg Intravenous Once  . furosemide  20 mg Intravenous Q12H  . gabapentin  300 mg Oral q AM  . gabapentin  600 mg Oral QHS  . lisinopril  10 mg Oral Daily  . potassium chloride  40 mEq Oral Q4H  . simvastatin  20 mg Oral q1800   Continuous  Infusions: . diltiazem (CARDIZEM) infusion 7.5 mg/hr (05/04/20 4627)    Consultants:see note  Procedures:see note  Antimicrobials: Anti-infectives (From admission, onward)   None     Culture/Microbiology No results found for: SDES, SPECREQUEST, CULT, REPTSTATUS  Other culture-see note  Objective: Vitals: Today's Vitals   05/04/20 0503 05/04/20 0528 05/04/20 0548 05/04/20 0600  BP:  110/69  96/71  Pulse:  (!) 101  85  Resp:  20  15  Temp:    97.9 F (36.6 C)  TempSrc:    Oral  SpO2:  94%  95%  Weight:      Height:      PainSc: 8   Asleep     Intake/Output Summary (Last 24 hours) at 05/04/2020 0759 Last data filed at 05/04/2020 0648 Gross per 24 hour  Intake 64.57 ml  Output --  Net 64.57  ml   Filed Weights   05/03/20 1839 05/03/20 2121 05/04/20 0300  Weight: 117.9 kg 128.1 kg 128.2 kg   Weight change:   Intake/Output from previous day: 03/24 0701 - 03/25 0700 In: 64.6 [I.V.:64.6] Out: -  Intake/Output this shift: No intake/output data recorded. Filed Weights   05/03/20 1839 05/03/20 2121 05/04/20 0300  Weight: 117.9 kg 128.1 kg 128.2 kg    Examination: General exam: AAOx3 ,NAD, weak appearing. HEENT:Oral mucosa moist, Ear/Nose WNL grossly,dentition normal. Respiratory system: bilaterally diminished,no use of accessory muscle, non tender. Cardiovascular system: S1 & S2 +, regular, No JVD. Gastrointestinal system: Abdomen soft, NT,ND, BS+. Nervous System:Alert, awake, moving extremities and grossly nonfocal Extremities: b/l extensive edema, distal peripheral pulses palpable.  Skin: No rashes,no icterus. MSK: Normal muscle bulk,tone, power  Data Reviewed: I have personally reviewed following labs and imaging studies CBC: Recent Labs  Lab 05/03/20 1744 05/04/20 0043  WBC 6.9 8.5  NEUTROABS  --  5.9  HGB 14.9 14.0  HCT 46.9* 42.3  MCV 103.1* 101.2*  PLT 161 035*   Basic Metabolic Panel: Recent Labs  Lab 05/03/20 1751 05/04/20 0043  NA 141 140  K 4.2 3.1*  CL 104 103  CO2 31 33*  GLUCOSE 107* 124*  BUN 12 12  CREATININE 0.91 0.93  CALCIUM 9.4 8.6*  MG 2.2 2.0   GFR: Estimated Creatinine Clearance: 71.4 mL/min (by C-G formula based on SCr of 0.93 mg/dL). Liver Function Tests: Recent Labs  Lab 05/03/20 1751 05/04/20 0043  AST 16 15  ALT 11 10  ALKPHOS 47 42  BILITOT 0.6 0.6  PROT 6.0* 5.6*  ALBUMIN 3.8 3.3*   No results for input(s): LIPASE, AMYLASE in the last 168 hours. No results for input(s): AMMONIA in the last 168 hours. Coagulation Profile: No results for input(s): INR, PROTIME in the last 168 hours. Cardiac Enzymes: No results for input(s): CKTOTAL, CKMB, CKMBINDEX, TROPONINI in the last 168 hours. BNP (last 3 results) No  results for input(s): PROBNP in the last 8760 hours. HbA1C: No results for input(s): HGBA1C in the last 72 hours. CBG: No results for input(s): GLUCAP in the last 168 hours. Lipid Profile: No results for input(s): CHOL, HDL, LDLCALC, TRIG, CHOLHDL, LDLDIRECT in the last 72 hours. Thyroid Function Tests: Recent Labs    05/03/20 1751  TSH 4.920*   Anemia Panel: No results for input(s): VITAMINB12, FOLATE, FERRITIN, TIBC, IRON, RETICCTPCT in the last 72 hours. Sepsis Labs: No results for input(s): PROCALCITON, LATICACIDVEN in the last 168 hours.  Recent Results (from the past 240 hour(s))  SARS CORONAVIRUS 2 (TAT 6-24 HRS) Nasopharyngeal Nasopharyngeal Swab  Status: None   Collection Time: 05/03/20  6:42 PM   Specimen: Nasopharyngeal Swab  Result Value Ref Range Status   SARS Coronavirus 2 NEGATIVE NEGATIVE Final    Comment: (NOTE) SARS-CoV-2 target nucleic acids are NOT DETECTED.  The SARS-CoV-2 RNA is generally detectable in upper and lower respiratory specimens during the acute phase of infection. Negative results do not preclude SARS-CoV-2 infection, do not rule out co-infections with other pathogens, and should not be used as the sole basis for treatment or other patient management decisions. Negative results must be combined with clinical observations, patient history, and epidemiological information. The expected result is Negative.  Fact Sheet for Patients: SugarRoll.be  Fact Sheet for Healthcare Providers: https://www.woods-mathews.com/  This test is not yet approved or cleared by the [place] FDA and  has been authorized for detection and/or diagnosis of SARS-CoV-2 by FDA under an Emergency Use Authorization (EUA). This EUA will remain  in effect (meaning this test can be used) for the duration of the COVID-19 declaration under Se ction 564(b)(1) of the Act, 21 U.S.C. section 360bbb-3(b)(1), unless the authorization  is terminated or revoked sooner.  Performed at [facility], [place] [address] [place], [place] [address]      Radiology Studies: DG Chest Portable 1 View  Result Date: 05/03/2020 CLINICAL DATA:  Chest pain EXAM: PORTABLE CHEST 1 VIEW COMPARISON:  None. FINDINGS: Cardiomegaly with vascular congestion. Increased markings in the lung bases could reflect atelectasis or early edema. No effusions. No acute bony abnormality. IMPRESSION: Cardiomegaly, vascular congestion. Increased markings in the bases could reflect atelectasis or early edema. Electronically Signed   By: Rolm Baptise M.D.   On: 05/03/2020 18:55     LOS: 0 days   Antonieta Pert, MD [facility]  05/04/2020, 7:59 AM

## 2020-05-04 NOTE — Progress Notes (Signed)
  Echocardiogram 2D Echocardiogram has been performed.  Merrie Roof F 05/04/2020, 9:05 AM

## 2020-05-04 NOTE — TOC Benefit Eligibility Note (Signed)
Patient Teacher, English as a foreign language completed.    The patient is currently admitted and upon discharge could be taking Eliquis 5 mg.  The current 30 day co-pay is, $47.00.   The patient is insured through [employer], [place] Patient Advocate Specialist [place] Team Direct Number: [phone]  Fax: [phone]

## 2020-05-04 NOTE — Consult Note (Signed)
Cardiology Consultation:   Patient ID: [name] MRN: [id number]; DOB: 11/12/1940  Admit date: 05/03/2020 Date of Consult: 05/04/2020  PCP:  Lajean Manes, [place]  Cardiologist:  Buford Dresser, MD new   Patient Profile:   Kim Orr is a 80 y.o. female with a hx of HTN, HLD, chronic back pain, lymphedema, obesity, and hyperthyroidism who is being seen today for the evaluation of Afib RVR at the request of Dr. Lupita Leash.  History of Present Illness:   [name] has no prior cardiac history . He does have a history of hyperthyroidism and is on methimazole. He has treated HTN and HLD. She has a history of leg edema previously scanned and negative for DVT (2019, [facility]). She was diagnosed with lymphedema.    Over the past 2 months, her lymphedema has progressively worsened and she developed painful blisters on her right leg. The NP at the assisted living facility noted tachycardia on routine vitals. She was sent to the ER via EMS. Upon arrival, HR was 151 with a regular R-R interval. She was started on cardizem drip with improvement in rate and found to be in atrial flutter.  During my interview, she has no prior cardiac history. She is unaware of her rhythm, denies episodes of syncope or near syncope, no palpitations, SOB, or chest pain. She does report worsening swelling in her right leg.   She is a never smoker, no DM, no family history of heart disease. She lives in independent living with her husband. She has two children and is originally from [place].   Echo completed and shows normal EF 55-60%, moderate MR, moderate TR, and elevated right atrial pressure.    Past Medical History:  Diagnosis Date   Essential hypertension 07/01/2017   Hemorrhoids 03/16/2017   History of nephrolithiasis 04/14/2016   Hyperlipidemia 07/01/2017   Hyperthyroidism 05/03/2020   Multinodular goiter 06/01/2018   Formatting of this note is different from the  original. 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  Risk of malignancy 5-15%    Osteoarthritis 07/01/2017   Sciatica 05/03/2020   Status post cholecystectomy 03/16/2017   Formatting of this note might be different from the original. Robotic cholecystectomy performed 1/30 @ [place] of this note might be different from the original. Robotic cholecystectomy performed 1/30 @ [facility]    History reviewed. No pertinent surgical history.   Home Medications:  Prior to Admission medications   Medication Sig Start Date End Date Taking? Authorizing Provider  acetaminophen (TYLENOL) 650 MG CR tablet Take 650 mg by mouth See admin instructions. Take 1,300 mg by mouth at 5 PM and during the night as needed for pain   Yes [provider]  calcium carbonate (TUMS - DOSED IN MG ELEMENTAL CALCIUM) 500 MG chewable tablet Chew 2 tablets by mouth in the morning.   Yes [provider]  cyclobenzaprine (FLEXERIL) 10 MG tablet Take 10 mg by mouth 3 (three) times daily as needed for muscle spasms.   Yes [provider]  furosemide (LASIX) 40 MG tablet Take 40 mg by mouth 2 (two) times daily. 05/01/20  Yes [provider]  gabapentin (NEURONTIN) 300 MG capsule Take 300-600 mg by mouth See admin instructions. Take 300 mg by mouth in the morning and 600 mg at bedtime   Yes [provider]  lisinopril (ZESTRIL) 10 MG tablet Take 10 mg by mouth daily.   Yes [provider]  Melatonin 10 MG TABS Take 10 mg by mouth at bedtime.   Yes [provider]  methimazole (TAPAZOLE) 5 MG tablet Take 5 mg by mouth daily.   Yes [provider]  Multiple Vitamins-Minerals (CENTRUM SILVER 50+WOMEN) TABS Take 1 tablet by mouth daily.   Yes [provider]  oxyCODONE (OXY IR/ROXICODONE) 5 MG immediate release tablet Take 5 mg by mouth every 12 (twelve) hours as needed for severe pain or moderate pain.    Yes [provider]  raloxifene (EVISTA) 60 MG tablet Take 60 mg by mouth at bedtime.   Yes [provider]  senna-docusate (SENOKOT-S) 8.6-50 MG tablet Take 2 tablets by mouth See admin instructions. Take 2 tablets by mouth in the evening as needed for constipation   Yes [provider]  simvastatin (ZOCOR) 20 MG tablet Take 20 mg by mouth at bedtime.   Yes [provider]  potassium chloride SA (KLOR-CON) 20 MEQ tablet Take 20 mEq by mouth daily. 05/01/20   [provider]  valACYclovir (VALTREX) 1000 MG tablet Take 1,000 mg by mouth 3 (three) times daily. Patient not taking: Reported on 05/03/2020 04/23/20   [provider]    Inpatient Medications: Scheduled Meds:  apixaban  5 mg Oral BID   diltiazem  10 mg Intravenous Once   furosemide  20 mg Intravenous Q12H   gabapentin  300 mg Oral q AM   gabapentin  600 mg Oral QHS   methimazole  5 mg Oral Daily   simvastatin  20 mg Oral q1800   Continuous Infusions:  diltiazem (CARDIZEM) infusion 7.5 mg/hr (05/04/20 1341)   PRN Meds: acetaminophen, cyclobenzaprine, ondansetron (ZOFRAN) IV, oxyCODONE, polyethylene glycol  Allergies:   No Known Allergies  Social History:   Social History   Socioeconomic History   Marital status: Married    Spouse name: Not on file   Number of children: Not on file   Years of education: Not on file   Highest education level: Not on file  Occupational History   Not on file  Tobacco Use   Smoking status: Never Smoker   Smokeless tobacco: Never Used  Substance and Sexual Activity   Alcohol use: Not on file   Drug use: Not on file   Sexual activity: Not on file  Other Topics Concern   Not on file  Social History Narrative   Not on file   Social Determinants of Health   Financial Resource Strain: Not on file  Food Insecurity: Not on file  Transportation Needs: Not on file  Physical Activity: Not on file  Stress: Not on  file  Social Connections: Not on file  Intimate Partner Violence: Not on file    Family History:    Family History  Problem Relation Age of Onset   Breast cancer Mother    Heart disease Father      ROS:  Please see the history of present illness.   All other ROS reviewed and negative.     Physical Exam/Data:   Vitals:   05/04/20 0801 05/04/20 0900 05/04/20 1100 05/04/20 1155  BP: 103/79 95/66  (!) 95/59  Pulse: 79 60 85 94  Resp: 20 10 20 19   Temp: 97.9 F (36.6 C)   98.3 F (36.8 C)  TempSrc: Oral   Oral  SpO2: 95% 93% 97% 93%  Weight:      Height:        Intake/Output Summary (Last 24 hours) at 05/04/2020  [place] filed at 05/04/2020 1500 Gross per 24 hour  Intake 64.57 ml  Output 1650 ml  Net -1585.43 ml   Last 3 Weights 05/04/2020 05/03/2020 05/03/2020  Weight (lbs) 282 lb 10.1 oz 282 lb 6.6 oz 260 lb  Weight (kg) 128.2 kg 128.1 kg 117.935 kg     Body mass index is 39.42 kg/m.  General:  Obese female in NAD HEENT: normal Neck: no JVD Vascular: No carotid bruits Cardiac:  Irregular rhythm, regular rate, murmur difficult to appreciate Lungs:  clear to auscultation bilaterally, no wheezing, rhonchi or rales  Abd: soft, nontender, no hepatomegaly  Ext: B LE edema, R > L, redness with sores on right Musculoskeletal:  No deformities, BUE and BLE strength normal and equal Skin: warm and dry  Neuro:  CNs 2-12 intact, no focal abnormalities noted Psych:  Normal affect   EKG:  The EKG was personally reviewed and demonstrates:  Repeat pending Telemetry:  Telemetry was personally reviewed and demonstrates:  Atrial flutter with rates fluctuating between 77-130s  Relevant CV Studies:  Echo 05/04/20: 1. Left ventricular ejection fraction, by estimation, is 55 to 60%. The  left ventricle has normal function. The left ventricle has no regional  wall motion abnormalities. Left ventricular diastolic function could not  be evaluated.  2. Right ventricular  systolic function is mildly reduced. The right  ventricular size is moderately enlarged. There is normal pulmonary artery  systolic pressure.  3. The mitral valve is normal in structure. Moderate mitral valve  regurgitation. No evidence of mitral stenosis.  4. Tricuspid valve regurgitation is moderate.  5. The aortic valve is tricuspid. Aortic valve regurgitation is not  visualized. Mild aortic valve sclerosis is present, with no evidence of  aortic valve stenosis.  6. The inferior vena cava is dilated in size with <50% respiratory  variability, suggesting right atrial pressure of 15 mmHg.   Laboratory Data:  High Sensitivity Troponin:   Recent Labs  Lab 05/03/20 1751 05/03/20 1950  TROPONINIHS 8 10     Chemistry Recent Labs  Lab 05/03/20 1751 05/04/20 0043 05/04/20 1128  NA 141 140  --   K 4.2 3.1* 3.6  CL 104 103  --   CO2 31 33*  --   GLUCOSE 107* 124*  --   BUN 12 12  --   CREATININE 0.91 0.93  --   CALCIUM 9.4 8.6*  --   GFRNONAA >60 >60  --   ANIONGAP 6 4*  --     Recent Labs  Lab 05/03/20 1751 05/04/20 0043  PROT 6.0* 5.6*  ALBUMIN 3.8 3.3*  AST 16 15  ALT 11 10  ALKPHOS 47 42  BILITOT 0.6 0.6   Hematology Recent Labs  Lab 05/03/20 1744 05/04/20 0043  WBC 6.9 8.5  RBC 4.55 4.18  HGB 14.9 14.0  HCT 46.9* 42.3  MCV 103.1* 101.2*  MCH 32.7 33.5  MCHC 31.8 33.1  RDW 14.2 14.2  PLT 161 148*   BNP Recent Labs  Lab 05/03/20 1751  BNP 242.4*    DDimer  Recent Labs  Lab 05/04/20 0043  DDIMER 0.52*     Radiology/Studies:  DG Chest Portable 1 View  Result Date: 05/03/2020 CLINICAL DATA:  Chest pain EXAM: PORTABLE CHEST 1 VIEW COMPARISON:  None. FINDINGS: Cardiomegaly with vascular congestion. Increased markings in the lung bases could reflect atelectasis or early edema. No effusions. No acute bony abnormality. IMPRESSION: Cardiomegaly, vascular congestion. Increased markings in the bases could reflect atelectasis  or early edema.  Electronically Signed   By: Rolm Baptise M.D.   On: 05/03/2020 18:55   ECHOCARDIOGRAM COMPLETE  Result Date: 05/04/2020    ECHOCARDIOGRAM REPORT   Patient Name:   CHERA SLIVKA Date of Exam: 05/04/2020 Medical Rec #:  [id number]        Height:       71.0 in Accession #:    [id number]       Weight:       282.6 lb Date of Birth:  Jun 29, 1940       BSA:          2.442 m Patient Age:    66 years         BP:           103/79 mmHg Patient Gender: F                HR:           73 bpm. Exam Location:  Inpatient Procedure: 2D Echo, Cardiac Doppler, Color Doppler and Intracardiac            Opacification Agent Indications:    I48.91* Unspeicified atrial fibrillation  History:        Patient has no prior history of Echocardiogram examinations.  Sonographer:    Merrie Roof RDCS Referring Phys: [id number] [place]  1. Left ventricular ejection fraction, by estimation, is 55 to 60%. The left ventricle has normal function. The left ventricle has no regional wall motion abnormalities. Left ventricular diastolic function could not be evaluated.  2. Right ventricular systolic function is mildly reduced. The right ventricular size is moderately enlarged. There is normal pulmonary artery systolic pressure.  3. The mitral valve is normal in structure. Moderate mitral valve regurgitation. No evidence of mitral stenosis.  4. Tricuspid valve regurgitation is moderate.  5. The aortic valve is tricuspid. Aortic valve regurgitation is not visualized. Mild aortic valve sclerosis is present, with no evidence of aortic valve stenosis.  6. The inferior vena cava is dilated in size with <50% respiratory variability, suggesting right atrial pressure of 15 mmHg. FINDINGS  Left Ventricle: Left ventricular ejection fraction, by estimation, is 55 to 60%. The left ventricle has normal function. The left ventricle has no regional wall motion abnormalities. Definity contrast agent was given IV to delineate the left ventricular   endocardial borders. The left ventricular internal cavity size was normal in size. There is borderline concentric left ventricular hypertrophy. Left ventricular diastolic function could not be evaluated due to atrial fibrillation. Left ventricular diastolic function could not be evaluated. Right Ventricle: The right ventricular size is moderately enlarged. No increase in right ventricular wall thickness. Right ventricular systolic function is mildly reduced. There is normal pulmonary artery systolic pressure. The tricuspid regurgitant velocity is 2.26 m/s, and with an assumed right atrial pressure of 15 mmHg, the estimated right ventricular systolic pressure is 39.7 mmHg. Left Atrium: Left atrial size was normal in size. Right Atrium: Right atrial size was normal in size. Pericardium: There is no evidence of pericardial effusion. Mitral Valve: The mitral valve is normal in structure. Moderate mitral valve regurgitation. No evidence of mitral valve stenosis. Tricuspid Valve: The tricuspid valve is normal in structure. Tricuspid valve regurgitation is moderate . No evidence of tricuspid stenosis. Aortic Valve: The aortic valve is tricuspid. Aortic valve regurgitation is not visualized. Mild aortic valve sclerosis is present, with no evidence of aortic valve stenosis. Aortic valve mean gradient measures 5.0 mmHg. Aortic  valve peak gradient measures 7.5 mmHg. Aortic valve area, by VTI measures 2.42 cm. Pulmonic Valve: The pulmonic valve was normal in structure. Pulmonic valve regurgitation is not visualized. No evidence of pulmonic stenosis. Aorta: The aortic root is normal in size and structure. Venous: The inferior vena cava is dilated in size with less than 50% respiratory variability, suggesting right atrial pressure of 15 mmHg. IAS/Shunts: No atrial level shunt detected by color flow Doppler.  LEFT VENTRICLE PLAX 2D LVIDd:         4.40 cm LVIDs:         3.40 cm LV PW:         1.20 cm LV IVS:        1.20 cm LVOT  diam:     2.20 cm LV SV:         56 LV SV Index:   23 LVOT Area:     3.80 cm  RIGHT VENTRICLE             IVC RV Basal diam:  5.00 cm     IVC diam: 2.30 cm RV Mid diam:    4.20 cm RV S prime:     10.00 cm/s TAPSE (M-mode): 1.7 cm LEFT ATRIUM           Index       RIGHT ATRIUM           Index LA diam:      2.80 cm 1.15 cm/m  RA Area:     29.10 cm LA Vol (A4C): 65.5 ml 26.82 ml/m RA Volume:   101.00 ml 41.35 ml/m  AORTIC VALVE AV Area (Vmax):    2.59 cm AV Area (Vmean):   2.28 cm AV Area (VTI):     2.42 cm AV Vmax:           137.00 cm/s AV Vmean:          104.000 cm/s AV VTI:            0.231 m AV Peak Grad:      7.5 mmHg AV Mean Grad:      5.0 mmHg LVOT Vmax:         93.50 cm/s LVOT Vmean:        62.400 cm/s LVOT VTI:          0.147 m LVOT/AV VTI ratio: 0.64  AORTA Ao Root diam: 2.30 cm Ao Asc diam:  3.20 cm TRICUSPID VALVE TR Peak grad:   20.4 mmHg TR Vmax:        226.00 cm/s  SHUNTS Systemic VTI:  0.15 m Systemic Diam: 2.20 cm Mihai Croitoru MD Electronically signed by Sanda Klein MD Signature Date/Time: 05/04/2020/12:15:32 PM    Final      Assessment and Plan:   Atrial flutter/fibrillation with RVR - rates improved on cardizem gtt running at 7.5 mg/hr, but limited by pressure - I think rhythm control could be challenging given her obesity and possible OSA - will hold off on amiodarone for now since we do not know how long she has been in Fib/flutter - will plan for TEE-guided cardioversion on Tues (schedule full on Monday)   Chronic anticoagulation This patients CHA2DS2-VASc Score and unadjusted Ischemic Stroke Rate (% per year) is equal to 3.2 % stroke rate/year from a score of 3 (age1, female, HTN) - agree with eliquis - she is very sedentary, but denies recent falls - she walks around the apartment with a rolling walker, uses a wheelchair for  longer distances   Hypertension - holding home lisinopril for rate controlling medications   Hyperlipidemia - continue home statin - LDL  previously well-controlled, no changes   Moderate MR Moderate TR - repeat echo in 6-12 months   Lymphedema - on 40 mg lasix BID at home   Hyperthyroidism - TSH mildly elevated here - will need to monitor closely with amiodarone - continue methimazole   NPO at [facility] Monday night.   Risk Assessment/Risk Scores:    CHA2DS2-VASc Score = 4  This indicates a 4.8% annual risk of stroke. The patient's score is based upon: CHF History: No HTN History: Yes Diabetes History: No Stroke History: No Vascular Disease History: No Age Score: 2 Gender Score: 1       For questions or updates, please contact Allison Please consult [email or web address] for contact info under    Signed, Ledora Bottcher, [place]  05/04/2020 4:46 PM

## 2020-05-04 NOTE — Evaluation (Signed)
Occupational Therapy Evaluation Patient Details Name: Kim Orr MRN: [id number] DOB: [birth year] Today's Date: 05/04/2020    History of Present Illness 80 year old female history of hypothyroidism on methimazole, chronic pain syndrome secondary to chronic low back pain sciatica, hypertension, lymphedema, hyperlipidemia sent to ED by primary care doctor for new onset tachyarrhythmia.  Chronic leg edema that she typically has been suffering from due to her lymphedema has progressively worsened.  This continue to worsen until approximately 1 week ago when she began to develop painful blisters.   Clinical Impression   Patient admitted for the above diagnosis.  OT was walking by room and patient called out.  OT found patient and mattress soaked in urine despite purewick.  OT informed NT, but provided assist.  Given the level of saturation, it was determined to get her to the edge of the bed in sitting, and have her stand so the mattress could be thoroughly sanitized and dried.  Patient was able to perform suine to sit with Mod A, sat at the edge of the bed with Mod A, stood with Min Guard (bed elevated as high as it could).  Patient was able to take side steps to the head of the bed with RW and sat back.  Max A for sit to supine.  NT reapplied a new purewick, and patient was left supine with all needs within reach.  OT orders for tomorrow, and patient is on a Cardizem drip.  OT monitored HR and BP throughout: systolic BP ranged between 95 and 132, HR never got above 82 BPM.  RN notified of activity.  PTA, patient admits to limiting mobility to household distances, they live in a one BR apartment, needing a w/c for community mobility, and needing assist for ADL depending on leg pain. OT will follow her in the acute setting to maximize functional status for eventual return home with possibly [facility] OT.  Nursing informed that once out of bed to the recliner 3/26, hoyer lift will be needed to get her back.   Hoyer pad placed the [employer].      Follow Up Recommendations  Home health OT    Equipment Recommendations  None recommended by OT    Recommendations for Other Services       Precautions / Restrictions Precautions Precautions: Fall Restrictions Weight Bearing Restrictions: No Other Position/Activity Restrictions: painful LE's      Mobility Bed Mobility Overal bed mobility: Needs Assistance Bed Mobility: Rolling;Supine to Sit;Sit to Supine Rolling: Min assist   Supine to sit: Mod assist Sit to supine: Mod assist;Max assist        Transfers Overall transfer level: Needs assistance   Transfers: Sit to/from Stand Sit to Stand: Min assist         General transfer comment: bed elevated as high as it could go    Balance Overall balance assessment: Needs assistance Sitting-balance support: Feet supported;No upper extremity supported Sitting balance-Leahy Scale: Fair     Standing balance support: Bilateral upper extremity supported Standing balance-Leahy Scale: Poor                             ADL either performed or assessed with clinical judgement   ADL Overall ADL's : Needs assistance/impaired     Grooming: Wash/dry hands;Wash/dry face;Set up;Bed level           Upper Body Dressing : Minimal assistance;Sitting   Lower Body Dressing: Maximal assistance;Bed level  Vision Patient Visual Report: No change from baseline       Perception     Praxis      Pertinent Vitals/Pain Pain Assessment: 0-10 Pain Score: 6  Pain Location: LE's with movement. Pain Descriptors / Indicators: Tender;Grimacing;Guarding Pain Intervention(s): Monitored during session     Hand Dominance Right   Extremity/Trunk Assessment Upper Extremity Assessment Upper Extremity Assessment: Generalized weakness;LUE deficits/detail LUE Deficits / Details: old shouldeer injury LUE Sensation: WNL LUE Coordination: WNL   Lower  Extremity Assessment Lower Extremity Assessment: Defer to PT evaluation   Cervical / Trunk Assessment Cervical / Trunk Assessment: Kyphotic   Communication Communication Communication: No difficulties   Cognition Arousal/Alertness: Awake/alert Behavior During Therapy: WFL for tasks assessed/performed Overall Cognitive Status: Within Functional Limits for tasks assessed                                     General Comments  patient stood times tow for peri care    Exercises     Shoulder Instructions      Home Living Family/patient expects to be discharged to:: Private residence Living Arrangements: Spouse/significant other Available Help at Discharge: Family;Available 24 hours/day Type of Home: Independent living facility Home Access: Elevator     Home Layout: One level     Bathroom Shower/Tub: Occupational psychologist: Handicapped height Bathroom Accessibility: Yes How Accessible: Accessible via wheelchair Home Equipment: [place] - 4 wheels;Shower seat;Grab bars - toilet;Grab bars - tub/shower;Hand held shower head;Adaptive equipment;Wheelchair - manual;Hospital bed;Other (comment) (lift chair) Adaptive Equipment: [place] Additional Comments: ILF 3rd floor apartment with her spouse.  He uses a RW as well.      Prior Functioning/Environment Level of Independence: Needs assistance  Gait / Transfers Assistance Needed: can walk household distances with RW.  uses w/c for any community mobility in the ILF building, or outside. ADL's / Homemaking Assistance Needed: Spouse provides assist as needed for ADL, varies depending on pain.  Spouse does all cooking and cleaning.            OT Problem List: Decreased strength;Decreased activity tolerance;Impaired balance (sitting and/or standing);Pain;Obesity;Increased edema      OT Treatment/Interventions: Self-care/ADL training;Therapeutic exercise;DME and/or AE instruction;Therapeutic  activities;Patient/family education;Balance training    OT Goals(Current goals can be found in the care plan section) Acute Rehab OT Goals Patient Stated Goal: If I can maintain my mobility, I'd lkie to go home OT Goal Formulation: With patient Time For Goal Achievement: 05/18/20 Potential to Achieve Goals: Good ADL Goals Pt Will Perform Grooming: with supervision;sitting;standing Pt Will Perform Upper Body Bathing: with set-up;sitting;standing Pt Will Perform Upper Body Dressing: with set-up;sitting;standing Pt Will Transfer to Toilet: with modified independence;ambulating;bedside commode Pt Will Perform Toileting - Clothing Manipulation and hygiene: with supervision;sit to/from stand  OT Frequency: Min 2X/week   Barriers to D/C:    none noted       Co-evaluation              AM-PAC OT "6 Clicks" Daily Activity     Outcome Measure Help from another person eating meals?: None Help from another person taking care of personal grooming?: A Little Help from another person toileting, which includes using toliet, bedpan, or urinal?: A Lot Help from another person bathing (including washing, rinsing, drying)?: A Lot Help from another person to put on and taking off regular upper body clothing?: A Little Help from another person  to put on and taking off regular lower body clothing?: A Lot 6 Click Score: 16   End of Session Equipment Utilized During Treatment: Surveyor, mining Communication: Need for lift equipment  Activity Tolerance: Patient tolerated treatment well Patient left: in bed;with call bell/phone within reach  OT Visit Diagnosis: Unsteadiness on feet (R26.81);Muscle weakness (generalized) (M62.81);Pain Pain - Right/Left: Left Pain - part of body: Leg                Time: 0063-4949 OT Time Calculation (min): 28 min Charges:  OT General Charges $OT Visit: 1 Visit OT Evaluation $OT Eval Moderate Complexity: 1 Mod OT Treatments $Self Care/Home Management :  8-22 mins  05/04/2020  Rich, OTR/L  Acute Rehabilitation Services  Office:  [phone]   Metta Clines 05/04/2020, 3:50 PM

## 2020-05-04 NOTE — Plan of Care (Signed)
  Problem: Coping: Goal: Level of anxiety will decrease Outcome: Progressing   Problem: Elimination: Goal: Will not experience complications related to bowel motility Outcome: Progressing   

## 2020-05-05 DIAGNOSIS — I10 Essential (primary) hypertension: Secondary | ICD-10-CM | POA: Diagnosis not present

## 2020-05-05 DIAGNOSIS — I4892 Unspecified atrial flutter: Secondary | ICD-10-CM | POA: Diagnosis not present

## 2020-05-05 DIAGNOSIS — I340 Nonrheumatic mitral (valve) insufficiency: Secondary | ICD-10-CM | POA: Diagnosis not present

## 2020-05-05 LAB — BASIC METABOLIC PANEL
Anion gap: 6 (ref 5–15)
BUN: 12 mg/dL (ref 8–23)
CO2: 29 mmol/L (ref 22–32)
Calcium: 8.8 mg/dL — ABNORMAL LOW (ref 8.9–10.3)
Chloride: 105 mmol/L (ref 98–111)
Creatinine, Ser: 0.85 mg/dL (ref 0.44–1.00)
GFR, Estimated: >60 mL/min (ref >60)
Glucose, Bld: 99 mg/dL (ref 70–99)
Potassium: 3.7 mmol/L (ref 3.5–5.1)
Sodium: 140 mmol/L (ref 135–145)

## 2020-05-05 LAB — CBC
HCT: 43.5 % (ref 36.0–46.0)
Hemoglobin: 14.3 g/dL (ref 12.0–15.0)
MCH: 33 pg (ref 26.0–34.0)
MCHC: 32.9 g/dL (ref 30.0–36.0)
MCV: 100.5 fL — ABNORMAL HIGH (ref 80.0–100.0)
Platelets: 153 10*3/uL (ref 150–400)
RBC: 4.33 MIL/uL (ref 3.87–5.11)
RDW: 14.2 % (ref 11.5–15.5)
WBC: 8.1 10*3/uL (ref 4.0–10.5)
nRBC: 0 % (ref 0.0–0.2)

## 2020-05-05 MED ORDER — SENNOSIDES-DOCUSATE SODIUM 8.6-50 MG PO TABS
2.0000 | ORAL_TABLET | Freq: Every day | ORAL | Status: DC
  Administered 2020-05-05 – 2020-05-08 (×4): 2 via ORAL
  Filled 2020-05-05 (×5): qty 2

## 2020-05-05 MED ORDER — METOPROLOL TARTRATE 12.5 MG HALF TABLET
12.5000 mg | ORAL_TABLET | Freq: Four times a day (QID) | ORAL | Status: DC
  Administered 2020-05-05 – 2020-05-06 (×4): 12.5 mg via ORAL
  Filled 2020-05-05 (×4): qty 1

## 2020-05-05 MED ORDER — OXYCODONE HCL 5 MG PO TABS
5.0000 mg | ORAL_TABLET | Freq: Two times a day (BID) | ORAL | Status: DC | PRN
  Administered 2020-05-05 – 2020-05-10 (×6): 5 mg via ORAL
  Filled 2020-05-05 (×7): qty 1

## 2020-05-05 NOTE — Progress Notes (Signed)
Cardizem drip stopped at 1140 per protocol due to patient's SBP <90 even with drip at lowest rate of 5mg /hr. HR is controlled, atrial flutter with rates in the 70s at this time. BP has improved to 103/64.

## 2020-05-05 NOTE — Plan of Care (Signed)
  Problem: Clinical Measurements: Goal: Diagnostic test results will improve Outcome: Progressing   Problem: Nutrition: Goal: Adequate nutrition will be maintained Outcome: Progressing   

## 2020-05-05 NOTE — Progress Notes (Signed)
Progress Note  Patient Name: Kim Orr Date of Encounter: 05/05/2020  [facility] Cardiologist: Buford Dresser, MD   Subjective   Blood pressures improved with holding ACEi, but rates have been highly variable and difficult to control, even with diltiazem drip. Adding metoprolol today. She is asymptomatic. Cannot feel her heart rate or low blood pressure.   Inpatient Medications    Scheduled Meds: . apixaban  5 mg Oral BID  . diltiazem  10 mg Intravenous Once  . furosemide  20 mg Intravenous Q12H  . gabapentin  300 mg Oral q AM  . gabapentin  600 mg Oral QHS  . methimazole  5 mg Oral Daily  . metoprolol tartrate  12.5 mg Oral Q6H  . simvastatin  20 mg Oral q1800   Continuous Infusions: . diltiazem (CARDIZEM) infusion 12.5 mg/hr (05/05/20 0852)   PRN Meds: acetaminophen, cyclobenzaprine, ondansetron (ZOFRAN) IV, oxyCODONE, polyethylene glycol   Vital Signs    Vitals:   05/05/20 0449 05/05/20 0829 05/05/20 0921 05/05/20 1035  BP: 125/71 130/87 118/60 (!) 85/62  Pulse: (!) 128 (!) 147 (!) 115 68  Resp: 20 (!) 24 (!) 24 (!) 22  Temp:  99.2 F (37.3 C) 99.2 F (37.3 C) 98.5 F (36.9 C)  TempSrc:  Axillary Axillary Oral  SpO2: 99% 95% 91% 95%  Weight: 127.5 kg     Height:        Intake/Output Summary (Last 24 hours) at 05/05/2020 1057 Last data filed at 05/05/2020 0900 Gross per 24 hour  Intake 597 ml  Output 1650 ml  Net -1053 ml   Last 3 Weights 05/05/2020 05/04/2020 05/03/2020  Weight (lbs) 281 lb 1.4 oz 282 lb 10.1 oz 282 lb 6.6 oz  Weight (kg) 127.5 kg 128.2 kg 128.1 kg      Telemetry    Atrial flutter, with rates to 150 intermittently - Personally Reviewed  ECG    3/25 typical atrial flutter with variable AV conduction - Personally Reviewed  Physical Exam   GEN: No acute distress.   Neck: No JVD Cardiac: tachycardic, irregular S1 and S2, no rubs, or gallops. 2/6 systolic murmur Respiratory: Clear to auscultation bilaterally. GI:  Soft, nontender, non-distended  MS: bilateral LE R>L edema; No deformity. Neuro:  Nonfocal  Psych: Normal affect   Labs    High Sensitivity Troponin:   Recent Labs  Lab 05/03/20 1751 05/03/20 1950  TROPONINIHS 8 10      Chemistry Recent Labs  Lab 05/03/20 1751 05/04/20 0043 05/04/20 1128 05/05/20 0131  NA 141 140  --  140  K 4.2 3.1* 3.6 3.7  CL 104 103  --  105  CO2 31 33*  --  29  GLUCOSE 107* 124*  --  99  BUN 12 12  --  12  CREATININE 0.91 0.93  --  0.85  CALCIUM 9.4 8.6*  --  8.8*  PROT 6.0* 5.6*  --   --   ALBUMIN 3.8 3.3*  --   --   AST 16 15  --   --   ALT 11 10  --   --   ALKPHOS 47 42  --   --   BILITOT 0.6 0.6  --   --   GFRNONAA >60 >60  --  >60  ANIONGAP 6 4*  --  6     Hematology Recent Labs  Lab 05/03/20 1744 05/04/20 0043 05/05/20 0131  WBC 6.9 8.5 8.1  RBC 4.55 4.18 4.33  HGB 14.9  14.0 14.3  HCT 46.9* 42.3 43.5  MCV 103.1* 101.2* 100.5*  MCH 32.7 33.5 33.0  MCHC 31.8 33.1 32.9  RDW 14.2 14.2 14.2  PLT 161 148* 153    BNP Recent Labs  Lab 05/03/20 1751  BNP 242.4*     DDimer  Recent Labs  Lab 05/04/20 0043  DDIMER 0.52*     Radiology    DG Chest Portable 1 View  Result Date: 05/03/2020 CLINICAL DATA:  Chest pain EXAM: PORTABLE CHEST 1 VIEW COMPARISON:  None. FINDINGS: Cardiomegaly with vascular congestion. Increased markings in the lung bases could reflect atelectasis or early edema. No effusions. No acute bony abnormality. IMPRESSION: Cardiomegaly, vascular congestion. Increased markings in the bases could reflect atelectasis or early edema. Electronically Signed   By: Rolm Baptise M.D.   On: 05/03/2020 18:55   ECHOCARDIOGRAM COMPLETE  Result Date: 05/04/2020    ECHOCARDIOGRAM REPORT   Patient Name:   Kim Orr Date of Exam: 05/04/2020 Medical Rec #:  [id number]        Height:       71.0 in Accession #:    [id number]       Weight:       282.6 lb Date of Birth:  1940/11/20       BSA:          2.442 m Patient Age:     80 years         BP:           103/79 mmHg Patient Gender: F                HR:           73 bpm. Exam Location:  Inpatient Procedure: 2D Echo, Cardiac Doppler, Color Doppler and Intracardiac            Opacification Agent Indications:    I48.91* Unspeicified atrial fibrillation  History:        Patient has no prior history of Echocardiogram examinations.  Sonographer:    Merrie Roof RDCS Referring Phys: [id number] [place]  1. Left ventricular ejection fraction, by estimation, is 55 to 60%. The left ventricle has normal function. The left ventricle has no regional wall motion abnormalities. Left ventricular diastolic function could not be evaluated.  2. Right ventricular systolic function is mildly reduced. The right ventricular size is moderately enlarged. There is normal pulmonary artery systolic pressure.  3. The mitral valve is normal in structure. Moderate mitral valve regurgitation. No evidence of mitral stenosis.  4. Tricuspid valve regurgitation is moderate.  5. The aortic valve is tricuspid. Aortic valve regurgitation is not visualized. Mild aortic valve sclerosis is present, with no evidence of aortic valve stenosis.  6. The inferior vena cava is dilated in size with <50% respiratory variability, suggesting right atrial pressure of 15 mmHg. FINDINGS  Left Ventricle: Left ventricular ejection fraction, by estimation, is 55 to 60%. The left ventricle has normal function. The left ventricle has no regional wall motion abnormalities. Definity contrast agent was given IV to delineate the left ventricular  endocardial borders. The left ventricular internal cavity size was normal in size. There is borderline concentric left ventricular hypertrophy. Left ventricular diastolic function could not be evaluated due to atrial fibrillation. Left ventricular diastolic function could not be evaluated. Right Ventricle: The right ventricular size is moderately enlarged. No increase in right ventricular  wall thickness. Right ventricular systolic function is mildly reduced. There is normal pulmonary artery systolic pressure.  The tricuspid regurgitant velocity is 2.26 m/s, and with an assumed right atrial pressure of 15 mmHg, the estimated right ventricular systolic pressure is 52.8 mmHg. Left Atrium: Left atrial size was normal in size. Right Atrium: Right atrial size was normal in size. Pericardium: There is no evidence of pericardial effusion. Mitral Valve: The mitral valve is normal in structure. Moderate mitral valve regurgitation. No evidence of mitral valve stenosis. Tricuspid Valve: The tricuspid valve is normal in structure. Tricuspid valve regurgitation is moderate . No evidence of tricuspid stenosis. Aortic Valve: The aortic valve is tricuspid. Aortic valve regurgitation is not visualized. Mild aortic valve sclerosis is present, with no evidence of aortic valve stenosis. Aortic valve mean gradient measures 5.0 mmHg. Aortic valve peak gradient measures 7.5 mmHg. Aortic valve area, by VTI measures 2.42 cm. Pulmonic Valve: The pulmonic valve was normal in structure. Pulmonic valve regurgitation is not visualized. No evidence of pulmonic stenosis. Aorta: The aortic root is normal in size and structure. Venous: The inferior vena cava is dilated in size with less than 50% respiratory variability, suggesting right atrial pressure of 15 mmHg. IAS/Shunts: No atrial level shunt detected by color flow Doppler.  LEFT VENTRICLE PLAX 2D LVIDd:         4.40 cm LVIDs:         3.40 cm LV PW:         1.20 cm LV IVS:        1.20 cm LVOT diam:     2.20 cm LV SV:         56 LV SV Index:   23 LVOT Area:     3.80 cm  RIGHT VENTRICLE             IVC RV Basal diam:  5.00 cm     IVC diam: 2.30 cm RV Mid diam:    4.20 cm RV S prime:     10.00 cm/s TAPSE (M-mode): 1.7 cm LEFT ATRIUM           Index       RIGHT ATRIUM           Index LA diam:      2.80 cm 1.15 cm/m  RA Area:     29.10 cm LA Vol (A4C): 65.5 ml 26.82 ml/m RA  Volume:   101.00 ml 41.35 ml/m  AORTIC VALVE AV Area (Vmax):    2.59 cm AV Area (Vmean):   2.28 cm AV Area (VTI):     2.42 cm AV Vmax:           137.00 cm/s AV Vmean:          104.000 cm/s AV VTI:            0.231 m AV Peak Grad:      7.5 mmHg AV Mean Grad:      5.0 mmHg LVOT Vmax:         93.50 cm/s LVOT Vmean:        62.400 cm/s LVOT VTI:          0.147 m LVOT/AV VTI ratio: 0.64  AORTA Ao Root diam: 2.30 cm Ao Asc diam:  3.20 cm TRICUSPID VALVE TR Peak grad:   20.4 mmHg TR Vmax:        226.00 cm/s  SHUNTS Systemic VTI:  0.15 m Systemic Diam: 2.20 cm Dani Gobble Croitoru MD Electronically signed by Sanda Klein MD Signature Date/Time: 05/04/2020/12:15:32 PM    Final     Cardiac Studies  Echo 05/04/20 1. Left ventricular ejection fraction, by estimation, is 55 to 60%. The  left ventricle has normal function. The left ventricle has no regional  wall motion abnormalities. Left ventricular diastolic function could not  be evaluated.  2. Right ventricular systolic function is mildly reduced. The right  ventricular size is moderately enlarged. There is normal pulmonary artery  systolic pressure.  3. The mitral valve is normal in structure. Moderate mitral valve  regurgitation. No evidence of mitral stenosis.  4. Tricuspid valve regurgitation is moderate.  5. The aortic valve is tricuspid. Aortic valve regurgitation is not  visualized. Mild aortic valve sclerosis is present, with no evidence of  aortic valve stenosis.  6. The inferior vena cava is dilated in size with <50% respiratory  variability, suggesting right atrial pressure of 15 mmHg.   Patient Profile     80 y.o. female without significant prior cardiac history presented with tachycardia, found to be in 2:1 atrial flutter. Cardiology consulted for further management.  Assessment & Plan    New atrial flutter: -initially in 2:1 flutter, goes rapidly intermittently despite diltiazem drip -she is unaware of her rhythm, unclear how  long she was in it prior to presentation. -given difficult rate control and risk of rapid flutter causing decompensation, will keep in hospital until she can have TEE-CV 3/29 -added metoprolol every 6 hours today for rate control -continue titration with diltiazem drip. If rate improves, would titrate down on drip as this is likely contributing to her intermittent hypotension -as unclear how long she had been in flutter, she is at risk for stroke with either chemical or electrical cardioversion without TEE -echo with preserved EF -chadsvasc4, started apixaban 5 mg BID this admission  Hypertension: -BP intermittently low, holding lisinopril  Hyperlipidemia: -continue home statin  Moderate MR, moderate TR on echo: follow up as outpatient  Hyperthyroidism: -on methimazole, defer to primary team -needs to be a consideration if amiodarone considered in the future  For questions or updates, please contact [facility] Please consult [email or web address] for contact info under        Signed, Buford Dresser, MD  05/05/2020, 10:57 AM

## 2020-05-05 NOTE — Progress Notes (Signed)
PROGRESS NOTE    Kim Orr  ZOX:[id number] DOB: Feb 06, 1941 DOA: 05/03/2020 PCP: Lajean Manes, MD   Chief Complaint  Patient presents with  . Tachycardia  Brief Narrative: 80 year old female history of hypothyroidism on methimazole, chronic pain syndrome secondary to chronic low back pain sciatica, hypertension, lymphedema, hyperlipidemia sent to ED by primary care doctor for new onset tachyarrhythmia. Patient was admitted for a flutter, cardiology was consulted.  Subjective: Seen and examined this morning. Overnight heart rate has been fluctuating this morning around 4 am in 128. And earlier was in 150s Cardizem rate was increased to 12.5 mg.  Was seen by cardiology immediately. Patient has no new complaints.  Assessment & Plan:  Atrial flutter with rapid ventricular rate, new onset:  w/ worsening leg edema.  Appreciate cardiology input, remains poorly controlled, continue on Cardizem drip rate increased, also added metoprolol 12.5 mg po q6h by cardiology.TSH near target, on methimazole.  Troponin negative x2 at 8-10, D-dimer at 0.52 corrected for age is normal. She has been started on Eliquis.  Patient is at high risk of decompensation and plans for TEE cardioversion on 3/29  Lymphedema/peripheral edema chronic with some worsening past week- baseline wt 260lb in December, currently 128kg/282 lb> 127 kg.Chest x-ray with some pulmonary edema: BNP borderline to 242.  Suspecting in the setting of a flutter she had some worsening edema.  Normally she is on Lasix 40 mg twice daily at home cont iv lasix. Echo with  Right ventricle systolic function mildly reduced, EF 55 to 60% on echocardiogram diastolic function could not be evaluated, has moderate MR.  Net balance Net IO Since Admission: -1,838.43 mL [05/05/20 1048]  Wt Readings from Last 3 Encounters:  05/05/20 127.5 kg   Moderate MR/moderate TR on echo will need outpatient follow-up  Hypokalemia:resolved.    Essential  hypertension: BP at times soft.Lisinopril discontinued.  Starting Metropol per cardiology, monitor blood pressure closely.  Hyperthyroidism on methimazole with a stable TSH.    Chronic low back pain/sciatica/chronic pain continue as needed oxycodone - at q12h prn. Cont her  muscle relaxant Neurontin.  Mixed hyperlipidemia: Continue Zocor.  Morbid obesity BMI 39:Annual benefit with PCP follow-up, weight loss.   Deconditioning:she will need PT/OT evaluations once heart rate is better controlled.  Currently on hold of as she is on Cardizem drip.  Diet Order            Diet Heart Room service appropriate? Yes; Fluid consistency: Thin  Diet effective now               Patient's Body mass index is 39.2 kg/m.  DVT prophylaxis: eliquis Code Status:   Code Status: Full Code  Family Communication: plan of care discussed with patient at bedside. She reports she has been updating her daughter who is a Marine scientist I called and updated her  Status is: Inpatient Patient remains hospitalized for ongoing management of A. flutter with RVR, worsening leg edema with IV Lasix and further work-up with cardiology.  Dispo: The patient is from: Home              Anticipated d/c is to: Home              Patient currently is not medically stable to d/c.   Difficult to place patient No Unresulted Labs (From admission, onward)          Start     Ordered   05/05/20 8119  Basic metabolic panel  Daily,   R  Question:  Specimen collection method  Answer:  Lab=Lab collect   05/04/20 1031           Medications reviewed:  Scheduled Meds: . apixaban  5 mg Oral BID  . diltiazem  10 mg Intravenous Once  . furosemide  20 mg Intravenous Q12H  . gabapentin  300 mg Oral q AM  . gabapentin  600 mg Oral QHS  . methimazole  5 mg Oral Daily  . simvastatin  20 mg Oral q1800   Continuous Infusions: . diltiazem (CARDIZEM) infusion 7.5 mg/hr (05/05/20 0303)    Consultants:see note  Procedures:see  note  Antimicrobials: Anti-infectives (From admission, onward)   None     Culture/Microbiology No results found for: SDES, SPECREQUEST, CULT, REPTSTATUS  Other culture-see note  Objective: Vitals: Today's Vitals   05/05/20 0018 05/05/20 0126 05/05/20 0211 05/05/20 0449  BP: 120/72   125/71  Pulse: 92   (!) 128  Resp: 20   20  Temp: 98 F (36.7 C)     TempSrc: Oral     SpO2: 91%   99%  Weight:    127.5 kg  Height:      PainSc:  9  Asleep     Intake/Output Summary (Last 24 hours) at 05/05/2020 0818 Last data filed at 05/05/2020 0132 Gross per 24 hour  Intake --  Output 2500 ml  Net -2500 ml   Filed Weights   05/03/20 2121 05/04/20 0300 05/05/20 0449  Weight: 128.1 kg 128.2 kg 127.5 kg   Weight change: 9.565 kg  Intake/Output from previous day: 03/25 0701 - 03/26 0700 In: -  Out: 2500 [Urine:2500] Intake/Output this shift: No intake/output data recorded. Filed Weights   05/03/20 2121 05/04/20 0300 05/05/20 0449  Weight: 128.1 kg 128.2 kg 127.5 kg    Examination: General exam: AAOx3, obese NAD, weak appearing. HEENT:Oral mucosa moist, Ear/Nose WNL grossly, dentition normal. Respiratory system: bilaterally clear,no wheezing or crackles,no use of accessory muscle Cardiovascular system: S1 & S2 +, No JVD, irregualr and tachy. Gastrointestinal system: Abdomen soft, NT,ND, BS+ Nervous System:Alert, awake, moving extremities and grossly nonfocal Extremities: extensive leg edema, distal peripheral pulses palpable.  Skin: No rashes,no icterus. MSK: Normal muscle bulk,tone, power.  Data Reviewed: I have personally reviewed following labs and imaging studies CBC: Recent Labs  Lab 05/03/20 1744 05/04/20 0043 05/05/20 0131  WBC 6.9 8.5 8.1  NEUTROABS  --  5.9  --   HGB 14.9 14.0 14.3  HCT 46.9* 42.3 43.5  MCV 103.1* 101.2* 100.5*  PLT 161 148* 177   Basic Metabolic Panel: Recent Labs  Lab 05/03/20 1751 05/04/20 0043 05/04/20 1128 05/05/20 0131  NA 141  140  --  140  K 4.2 3.1* 3.6 3.7  CL 104 103  --  105  CO2 31 33*  --  29  GLUCOSE 107* 124*  --  99  BUN 12 12  --  12  CREATININE 0.91 0.93  --  0.85  CALCIUM 9.4 8.6*  --  8.8*  MG 2.2 2.0  --   --    GFR: Estimated Creatinine Clearance: 77.9 mL/min (by C-G formula based on SCr of 0.85 mg/dL). Liver Function Tests: Recent Labs  Lab 05/03/20 1751 05/04/20 0043  AST 16 15  ALT 11 10  ALKPHOS 47 42  BILITOT 0.6 0.6  PROT 6.0* 5.6*  ALBUMIN 3.8 3.3*   No results for input(s): LIPASE, AMYLASE in the last 168 hours. No results for input(s): AMMONIA in the last 168  hours. Coagulation Profile: No results for input(s): INR, PROTIME in the last 168 hours. Cardiac Enzymes: No results for input(s): CKTOTAL, CKMB, CKMBINDEX, TROPONINI in the last 168 hours. BNP (last 3 results) No results for input(s): PROBNP in the last 8760 hours. HbA1C: No results for input(s): HGBA1C in the last 72 hours. CBG: No results for input(s): GLUCAP in the last 168 hours. Lipid Profile: No results for input(s): CHOL, HDL, LDLCALC, TRIG, CHOLHDL, LDLDIRECT in the last 72 hours. Thyroid Function Tests: Recent Labs    05/03/20 1751  TSH 4.920*   Anemia Panel: No results for input(s): VITAMINB12, FOLATE, FERRITIN, TIBC, IRON, RETICCTPCT in the last 72 hours. Sepsis Labs: No results for input(s): PROCALCITON, LATICACIDVEN in the last 168 hours.  Recent Results (from the past 240 hour(s))  SARS CORONAVIRUS 2 (TAT 6-24 HRS) Nasopharyngeal Nasopharyngeal Swab     Status: None   Collection Time: 05/03/20  6:42 PM   Specimen: Nasopharyngeal Swab  Result Value Ref Range Status   SARS Coronavirus 2 NEGATIVE NEGATIVE Final    Comment: (NOTE) SARS-CoV-2 target nucleic acids are NOT DETECTED.  The SARS-CoV-2 RNA is generally detectable in upper and lower respiratory specimens during the acute phase of infection. Negative results do not preclude SARS-CoV-2 infection, do not rule out co-infections with  other pathogens, and should not be used as the sole basis for treatment or other patient management decisions. Negative results must be combined with clinical observations, patient history, and epidemiological information. The expected result is Negative.  Fact Sheet for Patients: SugarRoll.be  Fact Sheet for Healthcare Providers: https://www.woods-mathews.com/  This test is not yet approved or cleared by the [place] FDA and  has been authorized for detection and/or diagnosis of SARS-CoV-2 by FDA under an Emergency Use Authorization (EUA). This EUA will remain  in effect (meaning this test can be used) for the duration of the COVID-19 declaration under Se ction 564(b)(1) of the Act, 21 U.S.C. section 360bbb-3(b)(1), unless the authorization is terminated or revoked sooner.  Performed at [facility], [place] [address] [place], [place] [address]      Radiology Studies: DG Chest Portable 1 View  Result Date: 05/03/2020 CLINICAL DATA:  Chest pain EXAM: PORTABLE CHEST 1 VIEW COMPARISON:  None. FINDINGS: Cardiomegaly with vascular congestion. Increased markings in the lung bases could reflect atelectasis or early edema. No effusions. No acute bony abnormality. IMPRESSION: Cardiomegaly, vascular congestion. Increased markings in the bases could reflect atelectasis or early edema. Electronically Signed   By: Rolm Baptise M.D.   On: 05/03/2020 18:55   ECHOCARDIOGRAM COMPLETE  Result Date: 05/04/2020    ECHOCARDIOGRAM REPORT   Patient Name:   Kim Orr Date of Exam: 05/04/2020 Medical Rec #:  [id number]        Height:       71.0 in Accession #:    [id number]       Weight:       282.6 lb Date of Birth:  05-02-1940       BSA:          2.442 m Patient Age:    20 years         BP:           103/79 mmHg Patient Gender: F                HR:           73 bpm. Exam Location:  Inpatient Procedure: 2D Echo, Cardiac Doppler, Color Doppler  and  Intracardiac            Opacification Agent Indications:    I48.91* Unspeicified atrial fibrillation  History:        Patient has no prior history of Echocardiogram examinations.  Sonographer:    Merrie Roof RDCS Referring Phys: [id number] [place]  1. Left ventricular ejection fraction, by estimation, is 55 to 60%. The left ventricle has normal function. The left ventricle has no regional wall motion abnormalities. Left ventricular diastolic function could not be evaluated.  2. Right ventricular systolic function is mildly reduced. The right ventricular size is moderately enlarged. There is normal pulmonary artery systolic pressure.  3. The mitral valve is normal in structure. Moderate mitral valve regurgitation. No evidence of mitral stenosis.  4. Tricuspid valve regurgitation is moderate.  5. The aortic valve is tricuspid. Aortic valve regurgitation is not visualized. Mild aortic valve sclerosis is present, with no evidence of aortic valve stenosis.  6. The inferior vena cava is dilated in size with <50% respiratory variability, suggesting right atrial pressure of 15 mmHg. FINDINGS  Left Ventricle: Left ventricular ejection fraction, by estimation, is 55 to 60%. The left ventricle has normal function. The left ventricle has no regional wall motion abnormalities. Definity contrast agent was given IV to delineate the left ventricular  endocardial borders. The left ventricular internal cavity size was normal in size. There is borderline concentric left ventricular hypertrophy. Left ventricular diastolic function could not be evaluated due to atrial fibrillation. Left ventricular diastolic function could not be evaluated. Right Ventricle: The right ventricular size is moderately enlarged. No increase in right ventricular wall thickness. Right ventricular systolic function is mildly reduced. There is normal pulmonary artery systolic pressure. The tricuspid regurgitant velocity is 2.26 m/s, and with  an assumed right atrial pressure of 15 mmHg, the estimated right ventricular systolic pressure is 76.5 mmHg. Left Atrium: Left atrial size was normal in size. Right Atrium: Right atrial size was normal in size. Pericardium: There is no evidence of pericardial effusion. Mitral Valve: The mitral valve is normal in structure. Moderate mitral valve regurgitation. No evidence of mitral valve stenosis. Tricuspid Valve: The tricuspid valve is normal in structure. Tricuspid valve regurgitation is moderate . No evidence of tricuspid stenosis. Aortic Valve: The aortic valve is tricuspid. Aortic valve regurgitation is not visualized. Mild aortic valve sclerosis is present, with no evidence of aortic valve stenosis. Aortic valve mean gradient measures 5.0 mmHg. Aortic valve peak gradient measures 7.5 mmHg. Aortic valve area, by VTI measures 2.42 cm. Pulmonic Valve: The pulmonic valve was normal in structure. Pulmonic valve regurgitation is not visualized. No evidence of pulmonic stenosis. Aorta: The aortic root is normal in size and structure. Venous: The inferior vena cava is dilated in size with less than 50% respiratory variability, suggesting right atrial pressure of 15 mmHg. IAS/Shunts: No atrial level shunt detected by color flow Doppler.  LEFT VENTRICLE PLAX 2D LVIDd:         4.40 cm LVIDs:         3.40 cm LV PW:         1.20 cm LV IVS:        1.20 cm LVOT diam:     2.20 cm LV SV:         56 LV SV Index:   23 LVOT Area:     3.80 cm  RIGHT VENTRICLE             IVC RV Basal diam:  5.00 cm     IVC diam: 2.30 cm RV Mid diam:    4.20 cm RV S prime:     10.00 cm/s TAPSE (M-mode): 1.7 cm LEFT ATRIUM           Index       RIGHT ATRIUM           Index LA diam:      2.80 cm 1.15 cm/m  RA Area:     29.10 cm LA Vol (A4C): 65.5 ml 26.82 ml/m RA Volume:   101.00 ml 41.35 ml/m  AORTIC VALVE AV Area (Vmax):    2.59 cm AV Area (Vmean):   2.28 cm AV Area (VTI):     2.42 cm AV Vmax:           137.00 cm/s AV Vmean:           104.000 cm/s AV VTI:            0.231 m AV Peak Grad:      7.5 mmHg AV Mean Grad:      5.0 mmHg LVOT Vmax:         93.50 cm/s LVOT Vmean:        62.400 cm/s LVOT VTI:          0.147 m LVOT/AV VTI ratio: 0.64  AORTA Ao Root diam: 2.30 cm Ao Asc diam:  3.20 cm TRICUSPID VALVE TR Peak grad:   20.4 mmHg TR Vmax:        226.00 cm/s  SHUNTS Systemic VTI:  0.15 m Systemic Diam: 2.20 cm Dani Gobble Croitoru MD Electronically signed by Sanda Klein MD Signature Date/Time: 05/04/2020/12:15:32 PM    Final      LOS: 1 day   Antonieta Pert, MD [facility]  05/05/2020, 8:18 AM

## 2020-05-05 NOTE — Progress Notes (Signed)
   05/05/20 0829  Assess: MEWS Score  Temp 99.2 F (37.3 C)  BP 130/87  Pulse Rate (!) 147  ECG Heart Rate (!) 130  Resp (!) 24  Level of Consciousness Alert  SpO2 95 %  O2 Device Room Air  Assess: MEWS Score  MEWS Temp 0  MEWS Systolic 0  MEWS Pulse 3  MEWS RR 1  MEWS LOC 0  MEWS Score 4  MEWS Score Color Red  Assess: if the MEWS score is Yellow or Red  Were vital signs taken at a resting state? Yes  Focused Assessment No change from prior assessment  Early Detection of Sepsis Score *See Row Information* Low  MEWS guidelines implemented *See Row Information* Yes  Treat  MEWS Interventions Administered scheduled meds/treatments;Escalated (See documentation below)  Pain Scale 0-10  Pain Score 10  Pain Type Chronic pain  Pain Location Back  Pain Orientation Lower  Pain Radiating Towards leg  Pain Descriptors / Indicators Aching;Discomfort  Pain Frequency Constant  Pain Onset On-going  Patients Stated Pain Goal 5  Pain Intervention(s) Medication (See eMAR)  Multiple Pain Sites No  Take Vital Signs  Increase Vital Sign Frequency  Red: Q 1hr X 4 then Q 4hr X 4, if remains red, continue Q 4hrs  Escalate  MEWS: Escalate Red: discuss with charge nurse/RN and provider, consider discussing with RRT  Notify: Charge Nurse/RN  Name of Charge Nurse/RN Notified Vaughan Basta  Date Charge Nurse/RN Notified 05/05/20  Time Charge Nurse/RN Notified [phone]  Notify: Provider  Provider Name/Title Antonieta Pert MD  Date Provider Notified 05/05/20  Time Provider Notified [phone]  Notification Type Page  Notification Reason Change in status  Provider response See new orders (Dr. Harrell Gave w/ cards making rounds; added metoprolol PO)  Date of Provider Response 05/05/20  Time of Provider Response [phone]  Document  Patient Outcome Stabilized after interventions (Increasing Cardizem drip)  Progress note created (see row info) Yes

## 2020-05-06 DIAGNOSIS — I4892 Unspecified atrial flutter: Secondary | ICD-10-CM | POA: Diagnosis not present

## 2020-05-06 DIAGNOSIS — I10 Essential (primary) hypertension: Secondary | ICD-10-CM | POA: Diagnosis not present

## 2020-05-06 DIAGNOSIS — I340 Nonrheumatic mitral (valve) insufficiency: Secondary | ICD-10-CM | POA: Diagnosis not present

## 2020-05-06 LAB — BASIC METABOLIC PANEL
Anion gap: 7 (ref 5–15)
BUN: 14 mg/dL (ref 8–23)
CO2: 30 mmol/L (ref 22–32)
Calcium: 8.8 mg/dL — ABNORMAL LOW (ref 8.9–10.3)
Chloride: 104 mmol/L (ref 98–111)
Creatinine, Ser: 0.88 mg/dL (ref 0.44–1.00)
GFR, Estimated: >60 mL/min (ref >60)
Glucose, Bld: 104 mg/dL — ABNORMAL HIGH (ref 70–99)
Potassium: 3.7 mmol/L (ref 3.5–5.1)
Sodium: 141 mmol/L (ref 135–145)

## 2020-05-06 LAB — CBC
HCT: 43.3 % (ref 36.0–46.0)
Hemoglobin: 14.3 g/dL (ref 12.0–15.0)
MCH: 33.3 pg (ref 26.0–34.0)
MCHC: 33 g/dL (ref 30.0–36.0)
MCV: 100.9 fL — ABNORMAL HIGH (ref 80.0–100.0)
Platelets: 156 10*3/uL (ref 150–400)
RBC: 4.29 MIL/uL (ref 3.87–5.11)
RDW: 13.9 % (ref 11.5–15.5)
WBC: 7.1 10*3/uL (ref 4.0–10.5)
nRBC: 0 % (ref 0.0–0.2)

## 2020-05-06 MED ORDER — METOPROLOL TARTRATE 25 MG PO TABS
25.0000 mg | ORAL_TABLET | Freq: Four times a day (QID) | ORAL | Status: DC
  Administered 2020-05-06 – 2020-05-11 (×22): 25 mg via ORAL
  Filled 2020-05-06 (×22): qty 1

## 2020-05-06 MED ORDER — OFF THE BEAT BOOK
Freq: Once | Status: AC
  Filled 2020-05-06: qty 1

## 2020-05-06 NOTE — Progress Notes (Signed)
PT needs assistance of 1-2 ppl to sit on the side of bed. Pt is currently sitting on the side of the bed but required maximum assistance to do so. PT consult ordered  05/06/20 0940  Progressive Mobility Assessment: Egress Test: EXCLUSION: Patient on bedrest, medically unstable, full comfort care  Ask patient to sit on edge of the bed No  Egress Test Bedfast Bedfast  Is bedfast different from baseline (prior to current illness) Yes  Ask patient to stand No

## 2020-05-06 NOTE — Progress Notes (Signed)
Progress Note  Patient Name: Kim Orr Date of Encounter: 05/06/2020  [facility] Cardiologist: Buford Dresser, MD   Subjective   Main concern is joint pain today, asking to get out of bed. Otherwise still cannot feel when her heart rate is up. No chest pain or shortness of breath.  Inpatient Medications    Scheduled Meds: . apixaban  5 mg Oral BID  . furosemide  20 mg Intravenous Q12H  . gabapentin  300 mg Oral q AM  . gabapentin  600 mg Oral QHS  . methimazole  5 mg Oral Daily  . metoprolol tartrate  25 mg Oral Q6H  . senna-docusate  2 tablet Oral QHS  . simvastatin  20 mg Oral q1800   Continuous Infusions: . diltiazem (CARDIZEM) infusion Stopped (05/05/20 1140)   PRN Meds: acetaminophen, cyclobenzaprine, ondansetron (ZOFRAN) IV, oxyCODONE, polyethylene glycol   Vital Signs    Vitals:   05/06/20 0345 05/06/20 0508 05/06/20 0807 05/06/20 0938  BP: 104/76 (!) 137/92 119/85   Pulse: (!) 105 (!) 127 (!) 121 (!) 125  Resp: (!) 25 18 18    Temp: 98.3 F (36.8 C) 99.5 F (37.5 C) 98.7 F (37.1 C)   TempSrc: Oral Oral Oral   SpO2: 95% 96% 95%   Weight: 126.1 kg     Height:        Intake/Output Summary (Last 24 hours) at 05/06/2020 0947 Last data filed at 05/06/2020 0014 Gross per 24 hour  Intake 218.65 ml  Output 850 ml  Net -631.35 ml   Last 3 Weights 05/06/2020 05/05/2020 05/04/2020  Weight (lbs) 278 lb 281 lb 1.4 oz 282 lb 10.1 oz  Weight (kg) 126.1 kg 127.5 kg 128.2 kg      Telemetry    Atrial flutter, with rates to 150 intermittently - Personally Reviewed  ECG    3/25 typical atrial flutter with variable AV conduction - Personally Reviewed  Physical Exam   GEN: Well nourished, well developed in no acute distress NECK: No JVD CARDIAC: tachycardic, irregular rhythm, normal S1 and S2, no rubs or gallops. 2/6 systolic murmur. VASCULAR: Radial pulses 2+ bilaterally.  RESPIRATORY:  Clear to auscultation without rales, wheezing or rhonchi   ABDOMEN: Soft, non-tender, non-distended MUSCULOSKELETAL:  Moves all 4 limbs independently SKIN: Warm and dry, bilateral LE edema R>L with erythema on R shin NEUROLOGIC:  No focal neuro deficits noted. PSYCHIATRIC:  Normal affect   Labs    High Sensitivity Troponin:   Recent Labs  Lab 05/03/20 1751 05/03/20 1950  TROPONINIHS 8 10      Chemistry Recent Labs  Lab 05/03/20 1751 05/04/20 0043 05/04/20 1128 05/05/20 0131 05/06/20 0342  NA 141 140  --  140 141  K 4.2 3.1* 3.6 3.7 3.7  CL 104 103  --  105 104  CO2 31 33*  --  29 30  GLUCOSE 107* 124*  --  99 104*  BUN 12 12  --  12 14  CREATININE 0.91 0.93  --  0.85 0.88  CALCIUM 9.4 8.6*  --  8.8* 8.8*  PROT 6.0* 5.6*  --   --   --   ALBUMIN 3.8 3.3*  --   --   --   AST 16 15  --   --   --   ALT 11 10  --   --   --   ALKPHOS 47 42  --   --   --   BILITOT 0.6 0.6  --   --   --  GFRNONAA >60 >60  --  >60 >60  ANIONGAP 6 4*  --  6 7     Hematology Recent Labs  Lab 05/04/20 0043 05/05/20 0131 05/06/20 0342  WBC 8.5 8.1 7.1  RBC 4.18 4.33 4.29  HGB 14.0 14.3 14.3  HCT 42.3 43.5 43.3  MCV 101.2* 100.5* 100.9*  MCH 33.5 33.0 33.3  MCHC 33.1 32.9 33.0  RDW 14.2 14.2 13.9  PLT 148* 153 156    BNP Recent Labs  Lab 05/03/20 1751  BNP 242.4*     DDimer  Recent Labs  Lab 05/04/20 0043  DDIMER 0.52*     Radiology    No results found.  Cardiac Studies   Echo 05/04/20 1. Left ventricular ejection fraction, by estimation, is 55 to 60%. The  left ventricle has normal function. The left ventricle has no regional  wall motion abnormalities. Left ventricular diastolic function could not  be evaluated.  2. Right ventricular systolic function is mildly reduced. The right  ventricular size is moderately enlarged. There is normal pulmonary artery  systolic pressure.  3. The mitral valve is normal in structure. Moderate mitral valve  regurgitation. No evidence of mitral stenosis.  4. Tricuspid valve  regurgitation is moderate.  5. The aortic valve is tricuspid. Aortic valve regurgitation is not  visualized. Mild aortic valve sclerosis is present, with no evidence of  aortic valve stenosis.  6. The inferior vena cava is dilated in size with <50% respiratory  variability, suggesting right atrial pressure of 15 mmHg.   Patient Profile     80 y.o. female without significant prior cardiac history presented with tachycardia, found to be in 2:1 atrial flutter. Cardiology consulted for further management.  Assessment & Plan    New atrial flutter: -initially in 2:1 flutter, goes rapidly intermittently even when she was on diltiazem drip -she is unaware of her rhythm, unclear how long she was in it prior to presentation. -given difficult rate control and risk of rapid flutter causing decompensation, will keep in hospital until she can have TEE-CV 3/29. Will make her NPO tonight just in case there is a  Cancellation in endoscopy tomorrow, otherwise will need to wait until 3/29. -doing well with metoprolol every 6 hours today for rate control, less hypotension than diltiazem. Will increase metoprolol dose today. -diltiazem drip off 3/26, this improved her blood pressure. Can use PRN but would try to avoid unless rate uncontrolled on metoprolol -as unclear how long she had been in flutter, she is at risk for stroke with either chemical or electrical cardioversion without TEE -echo with preserved EF -chadsvasc4, started apixaban 5 mg BID this admission  Hypertension: -BP intermittently low, holding lisinopril -tolerates metoprolol better than diltiazem  Hyperlipidemia: -continue home statin  Moderate MR, moderate TR on echo: follow up as outpatient  Hyperthyroidism: -on methimazole, defer to primary team -needs to be a consideration if amiodarone considered in the future  For questions or updates, please contact [facility] Please consult [email or web address] for contact info under         Signed, Buford Dresser, MD  05/06/2020, 9:47 AM

## 2020-05-06 NOTE — Plan of Care (Signed)
  Problem: Education: Goal: Knowledge of General Education information will improve Description: Including pain rating scale, medication(s)/side effects and non-pharmacologic comfort measures Outcome: Progressing   Problem: Health Behavior/Discharge Planning: Goal: Ability to manage health-related needs will improve Outcome: Progressing   Problem: Clinical Measurements: Goal: Will remain free from infection Outcome: Progressing Goal: Diagnostic test results will improve Outcome: Progressing Goal: Respiratory complications will improve Outcome: Progressing Goal: Cardiovascular complication will be avoided Outcome: Progressing   Problem: Activity: Goal: Risk for activity intolerance will decrease Outcome: Not Progressing   Problem: Nutrition: Goal: Adequate nutrition will be maintained Outcome: Progressing   Problem: Coping: Goal: Level of anxiety will decrease Outcome: Progressing   Problem: Elimination: Goal: Will not experience complications related to bowel motility Outcome: Progressing Goal: Will not experience complications related to urinary retention Outcome: Progressing   Problem: Pain Managment: Goal: General experience of comfort will improve Outcome: Progressing   Problem: Safety: Goal: Ability to remain free from injury will improve Outcome: Progressing   Problem: Skin Integrity: Goal: Risk for impaired skin integrity will decrease Outcome: Progressing   Problem: Activity: Goal: Ability to tolerate increased activity will improve Outcome: Not Progressing  Pt is extremely weak & can't even sit herself up on the side of the bed. Pt only does about 25% of the work. PT consult in

## 2020-05-06 NOTE — Progress Notes (Signed)
PROGRESS NOTE    Kim Orr  WLN:[id number] DOB: 22-Apr-1940 DOA: 05/03/2020 PCP: Lajean Manes, MD   Chief Complaint  Patient presents with  . Tachycardia  Brief Narrative: 80 year old female history of hypothyroidism on methimazole, chronic pain syndrome secondary to chronic low back pain sciatica, hypertension, lymphedema, hyperlipidemia sent to ED by primary care doctor for new onset tachyarrhythmia. Patient was admitted for a flutter, cardiology was consulted.  Patient was placed on Cardizem drip, anticoagulation is started with Eliquis  Subjective: Seen and examined. Patient complains of chronic back pain She has no new complaints no chest pain or palpitation or shortness of breath. Cardizem drip was stopped yesterday heart rate improved blood pressure less than 90. w/ minimal movement heart rate does jump in 130s.  Assessment & Plan:  Atrial flutter with rapid ventricular rate, new onset:  w/ worsening leg edema.Patient was placed on Cardizem drip, anticoagulation is started with Eliquis- metoprolol started 12.5 mg q6h 3/26- off cardizem gtt 3/26 pm-increasing metoprolol 25 mg.  Discussed with cardiology.  Workup-TSH stable, TTE lvef 55-60%, Troponin negative x2 at 8-10, D-dimer at 0.52 corrected for age is normal. Patient is at high risk of decompensation and plans for TEE cardioversion on 3/29. Cont plas as per cardio  Lymphedema/peripheral edema chronic with some worsening past week- baseline wt 260lb in December, currently 128kg/282 lb> 126 kg.CXR-some pulmonary edema: BNP borderline to 242.  Suspecting in the setting of a flutter she had some worsening edema.  Normally she is on Lasix 40 mg twice daily at home cont iv lasix 20 mg q12h- Echo with  Right ventricle systolic function mildly reduced, EF 55 to 60% on echocardiogram diastolic function could not be evaluated, has moderate MR.  Net balance Net IO Since Admission: -2,469.78 mL [05/06/20 0813]  Wt Readings from Last  3 Encounters:  05/06/20 126.1 kg   Moderate MR/moderate TR on echo will need outpatient follow-up  Hypokalemia: Improved.    Essential hypertension: BP remains stable currently was soft previously so lisinopril was discontinued.  Now on metoprolol for rate control  Hyperthyroidism on methimazole with a stable TSH.    Chronic low back pain/sciatica/chronic pain continue as needed oxycodone - at q12h prn- dose not use much pain meds at home per daughter.Cont her  muscle relaxant Neurontin.  Mixed hyperlipidemia:on Zocor.  Morbid obesity BMI 38:  she benefit with PCP follow-up, weight loss.   Deconditioning:she will need PT/OT evaluations once heart rate is better controlled.  Diet Order            Diet Heart Room service appropriate? Yes; Fluid consistency: Thin  Diet effective now               Patient's Body mass index is 38.77 kg/m.  DVT prophylaxis: eliquis Code Status:   Code Status: Full Code  Family Communication: plan of care discussed with patient at bedside. She reports she has been updating her daughter who is a Marine scientist I had called and updated her daughter.  Status is: Inpatient Patient remains hospitalized for ongoing management of A. flutter with RVR, worsening leg edema and further work-up with cardiology. Monitor Dispo: The patient is from: Home              Anticipated d/c is to: Home              Patient currently is not medically stable to d/c.   Difficult to place patient No Unresulted Labs (From admission, onward)  Start     Ordered   05/06/20 0500  CBC  Daily,   R     Question:  Specimen collection method  Answer:  Lab=Lab collect   05/05/20 1059   05/05/20 4174  Basic metabolic panel  Daily,   R     Question:  Specimen collection method  Answer:  Lab=Lab collect   05/04/20 1031           Medications reviewed:  Scheduled Meds: . apixaban  5 mg Oral BID  . diltiazem  10 mg Intravenous Once  . furosemide  20 mg Intravenous Q12H  .  gabapentin  300 mg Oral q AM  . gabapentin  600 mg Oral QHS  . methimazole  5 mg Oral Daily  . metoprolol tartrate  12.5 mg Oral Q6H  . senna-docusate  2 tablet Oral QHS  . simvastatin  20 mg Oral q1800   Continuous Infusions: . diltiazem (CARDIZEM) infusion Stopped (05/05/20 1140)    Consultants:see note  Procedures:see note  Antimicrobials: Anti-infectives (From admission, onward)   None     Culture/Microbiology No results found for: SDES, SPECREQUEST, CULT, REPTSTATUS  Other culture-see note  Objective: Vitals: Today's Vitals   05/06/20 0015 05/06/20 0345 05/06/20 0508 05/06/20 0807  BP: 111/74 104/76 (!) 137/92 119/85  Pulse: (!) 107 (!) 105 (!) 127 (!) 121  Resp: (!) 22 (!) 25 18 18   Temp: 98.3 F (36.8 C) 98.3 F (36.8 C) 99.5 F (37.5 C) 98.7 F (37.1 C)  TempSrc: Oral Oral Oral Oral  SpO2: 96% 95% 96% 95%  Weight:  126.1 kg    Height:      PainSc:        Intake/Output Summary (Last 24 hours) at 05/06/2020 0813 Last data filed at 05/06/2020 0014 Gross per 24 hour  Intake 815.65 ml  Output 850 ml  Net -34.35 ml   Filed Weights   05/04/20 0300 05/05/20 0449 05/06/20 0345  Weight: 128.2 kg 127.5 kg 126.1 kg   Weight change: -1.4 kg  Intake/Output from previous day: 03/26 0701 - 03/27 0700 In: 815.7 [P.O.:597; I.V.:218.7] Out: 850 [Urine:850] Intake/Output this shift: No intake/output data recorded. Filed Weights   05/04/20 0300 05/05/20 0449 05/06/20 0345  Weight: 128.2 kg 127.5 kg 126.1 kg    Examination: General exam: AAOx3, oebsem, in pain HEENT:Oral mucosa moist, Ear/Nose WNL grossly, dentition normal. Respiratory system: bilaterally clear,no wheezing or crackles,no use of accessory muscle Cardiovascular system: S1 & S2 +, No JVD,. Gastrointestinal system: Abdomen soft, NT,ND, BS+ Nervous System:Alert, awake, moving extremities and grossly nonfocal Extremities: Extensive lower extremity lymphedema , mild erythema  Skin: No rashes,no  icterus. MSK: Normal muscle bulk,tone, power  Data Reviewed: I have personally reviewed following labs and imaging studies CBC: Recent Labs  Lab 05/03/20 1744 05/04/20 0043 05/05/20 0131 05/06/20 0342  WBC 6.9 8.5 8.1 7.1  NEUTROABS  --  5.9  --   --   HGB 14.9 14.0 14.3 14.3  HCT 46.9* 42.3 43.5 43.3  MCV 103.1* 101.2* 100.5* 100.9*  PLT 161 148* 153 081   Basic Metabolic Panel: Recent Labs  Lab 05/03/20 1751 05/04/20 0043 05/04/20 1128 05/05/20 0131 05/06/20 0342  NA 141 140  --  140 141  K 4.2 3.1* 3.6 3.7 3.7  CL 104 103  --  105 104  CO2 31 33*  --  29 30  GLUCOSE 107* 124*  --  99 104*  BUN 12 12  --  12 14  CREATININE  0.91 0.93  --  0.85 0.88  CALCIUM 9.4 8.6*  --  8.8* 8.8*  MG 2.2 2.0  --   --   --    GFR: Estimated Creatinine Clearance: 74.8 mL/min (by C-G formula based on SCr of 0.88 mg/dL). Liver Function Tests: Recent Labs  Lab 05/03/20 1751 05/04/20 0043  AST 16 15  ALT 11 10  ALKPHOS 47 42  BILITOT 0.6 0.6  PROT 6.0* 5.6*  ALBUMIN 3.8 3.3*   No results for input(s): LIPASE, AMYLASE in the last 168 hours. No results for input(s): AMMONIA in the last 168 hours. Coagulation Profile: No results for input(s): INR, PROTIME in the last 168 hours. Cardiac Enzymes: No results for input(s): CKTOTAL, CKMB, CKMBINDEX, TROPONINI in the last 168 hours. BNP (last 3 results) No results for input(s): PROBNP in the last 8760 hours. HbA1C: No results for input(s): HGBA1C in the last 72 hours. CBG: No results for input(s): GLUCAP in the last 168 hours. Lipid Profile: No results for input(s): CHOL, HDL, LDLCALC, TRIG, CHOLHDL, LDLDIRECT in the last 72 hours. Thyroid Function Tests: Recent Labs    05/03/20 1751  TSH 4.920*   Anemia Panel: No results for input(s): VITAMINB12, FOLATE, FERRITIN, TIBC, IRON, RETICCTPCT in the last 72 hours. Sepsis Labs: No results for input(s): PROCALCITON, LATICACIDVEN in the last 168 hours.  Recent Results (from the  past 240 hour(s))  SARS CORONAVIRUS 2 (TAT 6-24 HRS) Nasopharyngeal Nasopharyngeal Swab     Status: None   Collection Time: 05/03/20  6:42 PM   Specimen: Nasopharyngeal Swab  Result Value Ref Range Status   SARS Coronavirus 2 NEGATIVE NEGATIVE Final    Comment: (NOTE) SARS-CoV-2 target nucleic acids are NOT DETECTED.  The SARS-CoV-2 RNA is generally detectable in upper and lower respiratory specimens during the acute phase of infection. Negative results do not preclude SARS-CoV-2 infection, do not rule out co-infections with other pathogens, and should not be used as the sole basis for treatment or other patient management decisions. Negative results must be combined with clinical observations, patient history, and epidemiological information. The expected result is Negative.  Fact Sheet for Patients: SugarRoll.be  Fact Sheet for Healthcare Providers: https://www.woods-mathews.com/  This test is not yet approved or cleared by the [place] FDA and  has been authorized for detection and/or diagnosis of SARS-CoV-2 by FDA under an Emergency Use Authorization (EUA). This EUA will remain  in effect (meaning this test can be used) for the duration of the COVID-19 declaration under Se ction 564(b)(1) of the Act, 21 U.S.C. section 360bbb-3(b)(1), unless the authorization is terminated or revoked sooner.  Performed at [facility], [place] [address], [place], [place] [address]      Radiology Studies: ECHOCARDIOGRAM COMPLETE  Result Date: 05/04/2020    ECHOCARDIOGRAM REPORT   Patient Name:   Kim Orr Date of Exam: 05/04/2020 Medical Rec #:  [id number]        Height:       71.0 in Accession #:    [id number]       Weight:       282.6 lb Date of Birth:  1940-12-02       BSA:          2.442 m Patient Age:    30 years         BP:           103/79 mmHg Patient Gender: F  HR:           73 bpm. Exam Location:  Inpatient  Procedure: 2D Echo, Cardiac Doppler, Color Doppler and Intracardiac            Opacification Agent Indications:    I48.91* Unspeicified atrial fibrillation  History:        Patient has no prior history of Echocardiogram examinations.  Sonographer:    Merrie Roof RDCS Referring Phys: [id number] [place]  1. Left ventricular ejection fraction, by estimation, is 55 to 60%. The left ventricle has normal function. The left ventricle has no regional wall motion abnormalities. Left ventricular diastolic function could not be evaluated.  2. Right ventricular systolic function is mildly reduced. The right ventricular size is moderately enlarged. There is normal pulmonary artery systolic pressure.  3. The mitral valve is normal in structure. Moderate mitral valve regurgitation. No evidence of mitral stenosis.  4. Tricuspid valve regurgitation is moderate.  5. The aortic valve is tricuspid. Aortic valve regurgitation is not visualized. Mild aortic valve sclerosis is present, with no evidence of aortic valve stenosis.  6. The inferior vena cava is dilated in size with <50% respiratory variability, suggesting right atrial pressure of 15 mmHg. FINDINGS  Left Ventricle: Left ventricular ejection fraction, by estimation, is 55 to 60%. The left ventricle has normal function. The left ventricle has no regional wall motion abnormalities. Definity contrast agent was given IV to delineate the left ventricular  endocardial borders. The left ventricular internal cavity size was normal in size. There is borderline concentric left ventricular hypertrophy. Left ventricular diastolic function could not be evaluated due to atrial fibrillation. Left ventricular diastolic function could not be evaluated. Right Ventricle: The right ventricular size is moderately enlarged. No increase in right ventricular wall thickness. Right ventricular systolic function is mildly reduced. There is normal pulmonary artery systolic pressure. The  tricuspid regurgitant velocity is 2.26 m/s, and with an assumed right atrial pressure of 15 mmHg, the estimated right ventricular systolic pressure is 45.4 mmHg. Left Atrium: Left atrial size was normal in size. Right Atrium: Right atrial size was normal in size. Pericardium: There is no evidence of pericardial effusion. Mitral Valve: The mitral valve is normal in structure. Moderate mitral valve regurgitation. No evidence of mitral valve stenosis. Tricuspid Valve: The tricuspid valve is normal in structure. Tricuspid valve regurgitation is moderate . No evidence of tricuspid stenosis. Aortic Valve: The aortic valve is tricuspid. Aortic valve regurgitation is not visualized. Mild aortic valve sclerosis is present, with no evidence of aortic valve stenosis. Aortic valve mean gradient measures 5.0 mmHg. Aortic valve peak gradient measures 7.5 mmHg. Aortic valve area, by VTI measures 2.42 cm. Pulmonic Valve: The pulmonic valve was normal in structure. Pulmonic valve regurgitation is not visualized. No evidence of pulmonic stenosis. Aorta: The aortic root is normal in size and structure. Venous: The inferior vena cava is dilated in size with less than 50% respiratory variability, suggesting right atrial pressure of 15 mmHg. IAS/Shunts: No atrial level shunt detected by color flow Doppler.  LEFT VENTRICLE PLAX 2D LVIDd:         4.40 cm LVIDs:         3.40 cm LV PW:         1.20 cm LV IVS:        1.20 cm LVOT diam:     2.20 cm LV SV:         56 LV SV Index:   23 LVOT Area:  3.80 cm  RIGHT VENTRICLE             IVC RV Basal diam:  5.00 cm     IVC diam: 2.30 cm RV Mid diam:    4.20 cm RV S prime:     10.00 cm/s TAPSE (M-mode): 1.7 cm LEFT ATRIUM           Index       RIGHT ATRIUM           Index LA diam:      2.80 cm 1.15 cm/m  RA Area:     29.10 cm LA Vol (A4C): 65.5 ml 26.82 ml/m RA Volume:   101.00 ml 41.35 ml/m  AORTIC VALVE AV Area (Vmax):    2.59 cm AV Area (Vmean):   2.28 cm AV Area (VTI):     2.42 cm AV  Vmax:           137.00 cm/s AV Vmean:          104.000 cm/s AV VTI:            0.231 m AV Peak Grad:      7.5 mmHg AV Mean Grad:      5.0 mmHg LVOT Vmax:         93.50 cm/s LVOT Vmean:        62.400 cm/s LVOT VTI:          0.147 m LVOT/AV VTI ratio: 0.64  AORTA Ao Root diam: 2.30 cm Ao Asc diam:  3.20 cm TRICUSPID VALVE TR Peak grad:   20.4 mmHg TR Vmax:        226.00 cm/s  SHUNTS Systemic VTI:  0.15 m Systemic Diam: 2.20 cm Dani Gobble Croitoru MD Electronically signed by Sanda Klein MD Signature Date/Time: 05/04/2020/12:15:32 PM    Final      LOS: 2 days   Antonieta Pert, MD [facility]  05/06/2020, 8:13 AM

## 2020-05-07 DIAGNOSIS — I4891 Unspecified atrial fibrillation: Secondary | ICD-10-CM

## 2020-05-07 DIAGNOSIS — I4892 Unspecified atrial flutter: Secondary | ICD-10-CM | POA: Diagnosis not present

## 2020-05-07 DIAGNOSIS — I10 Essential (primary) hypertension: Secondary | ICD-10-CM | POA: Diagnosis not present

## 2020-05-07 LAB — CBC
HCT: 47 % — ABNORMAL HIGH (ref 36.0–46.0)
Hemoglobin: 15.5 g/dL — ABNORMAL HIGH (ref 12.0–15.0)
MCH: 33.4 pg (ref 26.0–34.0)
MCHC: 33 g/dL (ref 30.0–36.0)
MCV: 101.3 fL — ABNORMAL HIGH (ref 80.0–100.0)
Platelets: 173 10*3/uL (ref 150–400)
RBC: 4.64 MIL/uL (ref 3.87–5.11)
RDW: 13.9 % (ref 11.5–15.5)
WBC: 9 10*3/uL (ref 4.0–10.5)
nRBC: 0 % (ref 0.0–0.2)

## 2020-05-07 LAB — BASIC METABOLIC PANEL
Anion gap: 7 (ref 5–15)
BUN: 13 mg/dL (ref 8–23)
CO2: 30 mmol/L (ref 22–32)
Calcium: 8.8 mg/dL — ABNORMAL LOW (ref 8.9–10.3)
Chloride: 103 mmol/L (ref 98–111)
Creatinine, Ser: 0.8 mg/dL (ref 0.44–1.00)
GFR, Estimated: >60 mL/min (ref >60)
Glucose, Bld: 109 mg/dL — ABNORMAL HIGH (ref 70–99)
Potassium: 3.4 mmol/L — ABNORMAL LOW (ref 3.5–5.1)
Sodium: 140 mmol/L (ref 135–145)

## 2020-05-07 LAB — GLUCOSE, CAPILLARY: Glucose-Capillary: 100 mg/dL — ABNORMAL HIGH (ref 70–99)

## 2020-05-07 MED ORDER — POTASSIUM CHLORIDE CRYS ER 20 MEQ PO TBCR
40.0000 meq | EXTENDED_RELEASE_TABLET | Freq: Once | ORAL | Status: AC
  Administered 2020-05-07: 40 meq via ORAL
  Filled 2020-05-07: qty 2

## 2020-05-07 MED ORDER — SODIUM CHLORIDE 0.9 % IV SOLN
INTRAVENOUS | Status: DC
Start: 2020-05-07 — End: 2020-05-08

## 2020-05-07 MED ORDER — DIPHENHYDRAMINE HCL 25 MG PO CAPS
25.0000 mg | ORAL_CAPSULE | Freq: Four times a day (QID) | ORAL | Status: DC | PRN
Start: 2020-05-07 — End: 2020-05-12
  Administered 2020-05-07: 25 mg via ORAL
  Filled 2020-05-07: qty 1

## 2020-05-07 MED ORDER — POTASSIUM CHLORIDE CRYS ER 20 MEQ PO TBCR
20.0000 meq | EXTENDED_RELEASE_TABLET | Freq: Once | ORAL | Status: AC
  Administered 2020-05-07: 20 meq via ORAL
  Filled 2020-05-07: qty 1

## 2020-05-07 NOTE — TOC Initial Note (Signed)
Transition of Care [facility]) - Initial/Assessment Note    Patient Details  Name: Kim Orr MRN: [id number] Date of Birth: 02-Nov-1940  [facility]) CM/SW Contact:    Trula Ore, [place] Phone Number: 05/07/2020, 1:27 PM  Clinical Narrative:                  CSW received consult for possible SNF placement at time of discharge. CSW spoke with patients daughter Kim Orr regarding PT recommendation of SNF placement at time of discharge. Daughter reports patient comes from [place].Patients daughter expressed understanding of PT recommendation and is agreeable to SNF placement at time of discharge. Patients daughter reports preference for physical therapy at [facility] .  Patient has received the COVID vaccines as well as the booster. No further questions reported at this time. CSW to continue to follow and assist with discharge planning needs.   Expected Discharge Plan: Skilled Nursing Facility Barriers to Discharge: Continued Medical Work up   Patient Goals and CMS Choice   CMS Medicare.gov Compare Post Acute Care list provided to:: Patient Represenative (must comment) (patients daughter Kim Orr) Choice offered to / list presented to : Adult Children Kim Orr)  Expected Discharge Plan and Services Expected Discharge Plan: [place] In-house Referral: Clinical Social Work     Living arrangements for the past 2 months: [place] Nurse, adult)                                      Prior Living Arrangements/Services Living arrangements for the past 2 months: [place] Nurse, adult)   Patient language and need for interpreter reviewed:: Yes Do you feel safe going back to the place where you live?: No   SNF  Need for Family Participation in Patient Care: Yes (Comment) Care giver support system in place?: Yes (comment)   Criminal Activity/Legal Involvement Pertinent to Current  Situation/Hospitalization: No - Comment as needed  Activities of Daily Living Home Assistive Devices/Equipment: Environmental consultant (specify type),Wheelchair ADL Screening (condition at time of admission) Patient's cognitive ability adequate to safely complete daily activities?: Yes Is the patient deaf or have difficulty hearing?: No Does the patient have difficulty seeing, even when wearing glasses/contacts?: No Does the patient have difficulty concentrating, remembering, or making decisions?: No Patient able to express need for assistance with ADLs?: Yes Does the patient have difficulty dressing or bathing?: No Independently performs ADLs?: No Dressing (OT): Independent Bathing: Needs assistance Is this a change from baseline?: Pre-admission baseline Does the patient have difficulty walking or climbing stairs?: Yes Weakness of Legs: Both Weakness of Arms/Hands: None  Permission Sought/Granted Permission sought to share information with : Case Manager,Family Chief Financial Officer       Permission granted to share info w AGENCY: SNF        Emotional Assessment       Orientation: : Oriented to Self,Oriented to Place,Oriented to  Time,Oriented to Situation (WDL) Alcohol / Substance Use: Not Applicable Psych Involvement: No (comment)  Admission diagnosis:  Atrial flutter with rapid ventricular response ([place]) [I48.92] Atrial fibrillation with RVR ([place]) [I48.91] Patient Active Problem List   Diagnosis Date Noted  . Atrial fibrillation with RVR ([place]) 05/04/2020  . Atrial flutter with rapid ventricular response ([place]) 05/03/2020  . Hyperthyroidism 05/03/2020  . Sciatica of left side associated with disorder of lumbosacral spine 05/03/2020  . Lymphedema of both lower extremities 05/03/2020  . Multinodular  goiter 06/01/2018  . Essential hypertension 07/01/2017  . Mixed hyperlipidemia 07/01/2017  . Osteoarthritis 07/01/2017  . Hemorrhoids 03/16/2017   PCP:  Lajean Manes, MD Pharmacy:   [facility] [place] [facility] [place] [address] [place] [address] Phone: [phone] Fax: [phone]     Social Determinants of Health (SDOH) Interventions    Readmission Risk Interventions No flowsheet data found.

## 2020-05-07 NOTE — Progress Notes (Signed)
PROGRESS NOTE    Kim Orr  WUJ:[id number] DOB: Jun 09, 1940 DOA: 05/03/2020 PCP: Lajean Manes, MD   Chief Complaint  Patient presents with  . Tachycardia  Brief Narrative: 80 year old female history of hypothyroidism on methimazole, chronic pain syndrome secondary to chronic low back pain sciatica, hypertension, lymphedema, hyperlipidemia sent to ED by primary care doctor for new onset tachyarrhythmia. Patient was admitted for a flutter, cardiology was consulted.  Patient was placed on Cardizem drip, anticoagulation is started with Eliquis Followed by cardiology very closely diuresed with IV Lasix  Subjective: Seen this morning.  Patient is anxious, heart rate at times poorly controlled  Nursing stating her anxiety is limiting most of her ADLs.   Has been n.p.o. in case slot opens up for cardioversion but it is full and diet is being resumed  Assessment & Plan:  Atrial flutter with rapid ventricular rate, new onset:  w/ worsening leg edema.Patient was placed on Cardizem drip, anticoagulation is started with Eliquis-started on metoprolol 12.5 mg q6h 3/26- off cardizem gtt 3/26-continue metoprolol at 25 mg every 6 hours per cardiology.  Work-up with-TSH stable, TTE lvef 55-60%, Troponin negative x2 at 8-10, D-dimer at 0.52 corrected for age is normal. Patient is at high risk of decompensation and plans for TEE cardioversion  In am and heart rate remains poorly controlled.  Appreciate cardiology input on board.  Lymphedema/peripheral edema chronic with some worsening past week- baseline wt 260lb in December, currently 128kg/282 lb with diuresis but is improving nicely > 126 kg> 124 KG. CXR-some pulmonary edema: BNP borderline to 242.  Suspecting in the setting of a flutter she had some worsening edema.  Normally she is on Lasix 40 mg twice daily at home cont iv lasix 20 mg q12h- Echo with  Right ventricle systolic function mildly reduced, EF 55 to 60% on echocardiogram diastolic function  could not be evaluated, has moderate MR. Having good urine output with net negative balance, Net IO Since Admission: -2,929.78 mL [05/07/20 1022]  Wt Readings from Last 3 Encounters:  05/07/20 124.5 kg   Moderate MR/moderate TR on echo will need outpatient follow-up  Hypokalemia:Repleting.    Essential hypertension:Soft:BP remains stable currently was soft previously so lisinopril was discontinued.  Now on metoprolol for rate control  Hyperthyroidism:on methimazole with a stable TSH.    Chronic low back pain/sciatica/chronic pain continue as needed oxycodone - at q12h prn- dose not use much pain meds at home per daughter.Cont her  muscle relaxant Neurontin.  Mixed hyperlipidemia: Continue Zocor.  Morbid obesity BMI 38:She will benefit with PCP follow-up,weight loss.   Deconditioning: Continue PT OT, likely need placement.    Anxiety issues nursing reports it is limiting her ADLs, we will add Benadryl as needed   Diet Order            Diet NPO time specified Except for: Sips with Meds  Diet effective midnight           Diet Heart Room service appropriate? Yes; Fluid consistency: Thin  Diet effective now               Patient's Body mass index is 38.28 kg/m.  DVT prophylaxis: eliquis Code Status:   Code Status: Full Code  Family Communication: plan of care discussed with patient at bedside. She reports she has been updating her daughter who is a Marine scientist I had called and updated her daughter previously.  Status is: Inpatient Patient remains hospitalized for ongoing management of A. flutter with RVR, worsening leg  edema and further work-up with cardiology.  Dispo: The patient is from: Home              Anticipated d/c is to: Home              Patient currently is not medically stable to d/c.   Difficult to place patient No Unresulted Labs (From admission, onward)          Start     Ordered   05/08/20 1025  Basic metabolic panel  Daily,   R     Question:  Specimen  collection method  Answer:  Lab=Lab collect   05/07/20 0803   05/06/20 0500  CBC  Daily,   R     Question:  Specimen collection method  Answer:  Lab=Lab collect   05/05/20 1059         Medications reviewed:  Scheduled Meds: . apixaban  5 mg Oral BID  . furosemide  20 mg Intravenous Q12H  . gabapentin  300 mg Oral q AM  . gabapentin  600 mg Oral QHS  . methimazole  5 mg Oral Daily  . metoprolol tartrate  25 mg Oral Q6H  . potassium chloride  20 mEq Oral Once  . senna-docusate  2 tablet Oral QHS  . simvastatin  20 mg Oral q1800   Continuous Infusions: . sodium chloride 20 mL/hr at 05/07/20 0901    Consultants:see note  Procedures:see note  Antimicrobials: Anti-infectives (From admission, onward)   None     Culture/Microbiology No results found for: SDES, SPECREQUEST, CULT, REPTSTATUS  Other culture-see note  Objective: Vitals: Today's Vitals   05/07/20 0439 05/07/20 0753 05/07/20 0853 05/07/20 0900  BP:  129/77 100/84   Pulse:  (!) 117 (!) 120 91  Resp:  (!) 25  17  Temp:  99.7 F (37.6 C)    TempSrc:  Oral    SpO2:  94%  96%  Weight: 124.5 kg     Height:      PainSc:        Intake/Output Summary (Last 24 hours) at 05/07/2020 1022 Last data filed at 05/07/2020 0014 Gross per 24 hour  Intake 240 ml  Output 700 ml  Net -460 ml   Filed Weights   05/05/20 0449 05/06/20 0345 05/07/20 0439  Weight: 127.5 kg 126.1 kg 124.5 kg   Weight change: -1.6 kg  Intake/Output from previous day: 03/27 0701 - 03/28 0700 In: 240 [P.O.:240] Out: 700 [Urine:700] Intake/Output this shift: No intake/output data recorded. Filed Weights   05/05/20 0449 05/06/20 0345 05/07/20 0439  Weight: 127.5 kg 126.1 kg 124.5 kg    Examination: General exam: AAOx3, obese onRA, weak appearing. HEENT:Oral mucosa moist, Ear/Nose WNL grossly, dentition normal. Respiratory system: bilaterally diminished,no wheezing or crackles,no use of accessory muscle Cardiovascular system: S1 & S2 +,  No JVD,. Gastrointestinal system: Abdomen soft, NT,ND, BS+ Nervous System:Alert, awake, moving extremities and grossly nonfocal Extremities: Bilateral extensive lymphedema, distal peripheral pulses palpable.  Skin: No rashes,no icterus. MSK: Normal muscle bulk,tone, power  Data Reviewed: I have personally reviewed following labs and imaging studies CBC: Recent Labs  Lab 05/03/20 1744 05/04/20 0043 05/05/20 0131 05/06/20 0342 05/07/20 0207  WBC 6.9 8.5 8.1 7.1 9.0  NEUTROABS  --  5.9  --   --   --   HGB 14.9 14.0 14.3 14.3 15.5*  HCT 46.9* 42.3 43.5 43.3 47.0*  MCV 103.1* 101.2* 100.5* 100.9* 101.3*  PLT 161 148* 153 156 173   Basic  Metabolic Panel: Recent Labs  Lab 05/03/20 1751 05/04/20 0043 05/04/20 1128 05/05/20 0131 05/06/20 0342 05/07/20 0207  NA 141 140  --  140 141 140  K 4.2 3.1* 3.6 3.7 3.7 3.4*  CL 104 103  --  105 104 103  CO2 31 33*  --  29 30 30   GLUCOSE 107* 124*  --  99 104* 109*  BUN 12 12  --  12 14 13   CREATININE 0.91 0.93  --  0.85 0.88 0.80  CALCIUM 9.4 8.6*  --  8.8* 8.8* 8.8*  MG 2.2 2.0  --   --   --   --    GFR: Estimated Creatinine Clearance: 81.7 mL/min (by C-G formula based on SCr of 0.8 mg/dL). Liver Function Tests: Recent Labs  Lab 05/03/20 1751 05/04/20 0043  AST 16 15  ALT 11 10  ALKPHOS 47 42  BILITOT 0.6 0.6  PROT 6.0* 5.6*  ALBUMIN 3.8 3.3*   No results for input(s): LIPASE, AMYLASE in the last 168 hours. No results for input(s): AMMONIA in the last 168 hours. Coagulation Profile: No results for input(s): INR, PROTIME in the last 168 hours. Cardiac Enzymes: No results for input(s): CKTOTAL, CKMB, CKMBINDEX, TROPONINI in the last 168 hours. BNP (last 3 results) No results for input(s): PROBNP in the last 8760 hours. HbA1C: No results for input(s): HGBA1C in the last 72 hours. CBG: Recent Labs  Lab 05/07/20 0750  GLUCAP 100*   Lipid Profile: No results for input(s): CHOL, HDL, LDLCALC, TRIG, CHOLHDL, LDLDIRECT in  the last 72 hours. Thyroid Function Tests: No results for input(s): TSH, T4TOTAL, FREET4, T3FREE, THYROIDAB in the last 72 hours. Anemia Panel: No results for input(s): VITAMINB12, FOLATE, FERRITIN, TIBC, IRON, RETICCTPCT in the last 72 hours. Sepsis Labs: No results for input(s): PROCALCITON, LATICACIDVEN in the last 168 hours.  Recent Results (from the past 240 hour(s))  SARS CORONAVIRUS 2 (TAT 6-24 HRS) Nasopharyngeal Nasopharyngeal Swab     Status: None   Collection Time: 05/03/20  6:42 PM   Specimen: Nasopharyngeal Swab  Result Value Ref Range Status   SARS Coronavirus 2 NEGATIVE NEGATIVE Final    Comment: (NOTE) SARS-CoV-2 target nucleic acids are NOT DETECTED.  The SARS-CoV-2 RNA is generally detectable in upper and lower respiratory specimens during the acute phase of infection. Negative results do not preclude SARS-CoV-2 infection, do not rule out co-infections with other pathogens, and should not be used as the sole basis for treatment or other patient management decisions. Negative results must be combined with clinical observations, patient history, and epidemiological information. The expected result is Negative.  Fact Sheet for Patients: SugarRoll.be  Fact Sheet for Healthcare Providers: https://www.woods-mathews.com/  This test is not yet approved or cleared by the [place] FDA and  has been authorized for detection and/or diagnosis of SARS-CoV-2 by FDA under an Emergency Use Authorization (EUA). This EUA will remain  in effect (meaning this test can be used) for the duration of the COVID-19 declaration under Se ction 564(b)(1) of the Act, 21 U.S.C. section 360bbb-3(b)(1), unless the authorization is terminated or revoked sooner.  Performed at [facility], [place] [address], [place], [place] [address]      Radiology Studies: No results found.   LOS: 3 days   Antonieta Pert, MD [facility]  05/07/2020, 10:22 AM

## 2020-05-07 NOTE — Discharge Instructions (Signed)

## 2020-05-07 NOTE — Evaluation (Signed)
Physical Therapy Evaluation Patient Details Name: Kim Orr MRN: [id number] DOB: [birth year] Today's Date: 05/07/2020   History of Present Illness  Pt is an 80 y.o. female admitted from PCP's office on 05/03/20 with atrial flutter with RVR, new onset; also some worsening edema. CXR with pulmonary edema.  Plan for TEE-CV on 3/29. PMH includes chronic pain syndrome, sciatica, HTN, lymphedema, morbid obesity.    Clinical Impression  Pt presents with an overall decrease in functional mobility secondary to above. PTA, pt resides at [place] with spouse who assists with ADL/iADLs; pt mod indep household ambulator with RW, uses w/c for community distances. Today, pt required mod-maxA+2 to stand and take a few steps with RW; limited by generalized weakness, c/o significant BLE pain, as well as significant anxiety requiring frequent redirection during session. Pt would benefit from continued acute PT services to maximize functional mobility and independence prior to d/c with SNF-level therapies. Pt reports willing to consider SNF if at [facility].  HR 120s-130s, briefly up to 150 during session BP 132/104     Follow Up Recommendations SNF;Supervision for mobility/OOB    Equipment Recommendations   (TBD)    Recommendations for Other Services       Precautions / Restrictions Precautions Precautions: Fall;Other (comment) Precaution Comments: Bladder incontinence; significant pain and anxiety Restrictions Weight Bearing Restrictions: No      Mobility  Bed Mobility Overal bed mobility: Needs Assistance Bed Mobility: Supine to Sit     Supine to sit: Max assist;+2 for safety/equipment;HOB elevated     General bed mobility comments: Cues for sequencing, maxA+2 for BLE management and trunk elevation; pt limited by c/o significant BLE pain and anxiety requiring frequent redirection and encouragemnet    Transfers Overall transfer level: Needs assistance Equipment used: Rolling  walker (2 wheeled) Transfers: Sit to/from Stand Sit to Stand: Max assist;+2 physical assistance;From elevated surface;+2 safety/equipment         General transfer comment: Mod-maxA+2 to assist trunk elevation standing from raised EOB; difficult to determine true physical limitations versus pt's significant anxiety with standing; pt bracing BLEs against bed for stability  Ambulation/Gait Ambulation/Gait assistance: Mod assist;+2 physical assistance;+2 safety/equipment Gait Distance (Feet): 2 Feet Assistive device: Rolling walker (2 wheeled) Gait Pattern/deviations: Step-to pattern;Trunk flexed;Antalgic;Leaning posteriorly Gait velocity: Decreased   General Gait Details: Pivotal steps from bed to recliner with RW and modA+2, pt with trunk flexed and bracing LEs against bed surface initially, requiring frequent cues for sequencing and safety, limited by pain and anxiety  Stairs            Wheelchair Mobility    Modified Rankin (Stroke Patients Only)       Balance Overall balance assessment: Needs assistance Sitting-balance support: Feet supported;No upper extremity supported Sitting balance-Leahy Scale: Fair Sitting balance - Comments: Initial modA progressing to min guard seated balance with and without UE support   Standing balance support: Bilateral upper extremity supported Standing balance-Leahy Scale: Poor Standing balance comment: Reliant on BUE support and external assist                             Pertinent Vitals/Pain Pain Assessment: Faces Faces Pain Scale: Hurts even more Pain Location: Eeverywhere - especially BLEs with movement, LUE, back Pain Descriptors / Indicators: Grimacing;Guarding;Moaning Pain Intervention(s): Monitored during session;Repositioned    Home Living Family/patient expects to be discharged to:: Private residence Living Arrangements: Spouse/significant other Available Help at Discharge: Family;Available 24 hours/day Type  of Home: Independent living facility Home Access: [place]: One level Home Equipment: [place] - 4 wheels;Shower seat;Grab bars - toilet;Grab bars - tub/shower;Hand held shower head;Adaptive equipment;Wheelchair - manual;Hospital bed;Other (comment) Additional Comments: Resides at [place] apt with her husband who uses a RW as well    Prior Function Level of Independence: Needs assistance   Gait / Transfers Assistance Needed: Mod indep for household distances with RW; uses w/c for community mobility in ILF building and outside  ADL's / Homemaking Assistance Needed: Husband assists with ADLs; amount of assist varies depending on pt's pain. Husband does all cooking and cleaning        Hand Dominance        Extremity/Trunk Assessment   Upper Extremity Assessment Upper Extremity Assessment: Generalized weakness;LUE deficits/detail LUE Deficits / Details: Chronic L shoulder injury    Lower Extremity Assessment Lower Extremity Assessment: RLE deficits/detail;LLE deficits/detail RLE Deficits / Details: Limited by c/o severe pain; functionally <3/5 throughout; redness and pitting edema bilateral lower leg/feet RLE: Unable to fully assess due to pain RLE Coordination: decreased gross motor;decreased fine motor LLE Deficits / Details: Limited by c/o severe pain; functionally <3/5 throughout; redness and pitting edema bilateral lower leg/feet LLE: Unable to fully assess due to pain LLE Coordination: decreased gross motor;decreased fine motor    Cervical / Trunk Assessment Cervical / Trunk Assessment: Kyphotic  Communication   Communication: No difficulties (some mumbling speech, but able to correct with cues)  Cognition Arousal/Alertness: Awake/alert Behavior During Therapy: Anxious Overall Cognitive Status: Impaired/Different from baseline Area of Impairment: Attention;Problem solving;Following commands;Awareness                   Current Attention  Level: Selective   Following Commands: Follows multi-step commands with increased time   Awareness: Emergent Problem Solving: Requires verbal cues General Comments: Pt internally distracted by anxiety, requiring frequent redirection and encouragement. Pt fluctuates between being appreciative of mobilizing (Had been asking to get OOB), then with multiple complaints and states, "I'd rather die than go through this hell"      General Comments General comments (skin integrity, edema, etc.): HR up to 150, BP 132/104. RN to discuss anxiety meds with MD as this seems to be a major limiting factor for mobility progression    Exercises     Assessment/Plan    PT Assessment Patient needs continued PT services  PT Problem List Decreased strength;Decreased range of motion;Decreased activity tolerance;Decreased balance;Decreased mobility;Decreased cognition;Cardiopulmonary status limiting activity;Obesity;Pain       PT Treatment Interventions DME instruction;Gait training;Functional mobility training;Therapeutic activities;Therapeutic exercise;Balance training;Cognitive remediation;Patient/family education;Wheelchair mobility training    PT Goals (Current goals can be found in the Care Plan section)  Acute Rehab PT Goals Patient Stated Goal: Willing to consider post-acute SNF rehab if at [facility] PT Goal Formulation: With patient Time For Goal Achievement: 05/21/20 Potential to Achieve Goals: Fair    Frequency Min 2X/week   Barriers to discharge Decreased caregiver support      Co-evaluation PT/OT/SLP Co-Evaluation/Treatment: Yes Reason for Co-Treatment: Necessary to address cognition/behavior during functional activity;For patient/therapist safety;To address functional/ADL transfers PT goals addressed during session: Mobility/safety with mobility;Balance;Proper use of DME         AM-PAC PT "6 Clicks" Mobility  Outcome Measure Help needed turning from your back to your side while  in a flat bed without using bedrails?: A Lot Help needed moving from lying on your back to sitting on the side of a flat  bed without using bedrails?: Total Help needed moving to and from a bed to a chair (including a wheelchair)?: A Lot Help needed standing up from a chair using your arms (e.g., wheelchair or bedside chair)?: Total Help needed to walk in hospital room?: Total Help needed climbing 3-5 steps with a railing? : Total 6 Click Score: 8    End of Session Equipment Utilized During Treatment: Gait belt Activity Tolerance: Patient tolerated treatment well;Patient limited by pain;Other (comment) (Patient limited by anxiety) Patient left: in chair;with call bell/phone within reach;with chair alarm set Nurse Communication: Mobility status;Need for lift equipment (recommend maximove lift for return to bed; pt with lift pad in room) PT Visit Diagnosis: Other abnormalities of gait and mobility (R26.89);Muscle weakness (generalized) (M62.81);Pain    Time: 5615-3794 PT Time Calculation (min) (ACUTE ONLY): 29 min   Charges:   PT Evaluation $PT Eval Moderate Complexity: [place], PT, DPT Acute Rehabilitation Services  Pager [phone] Office [place] 05/07/2020, 12:15 PM

## 2020-05-07 NOTE — Progress Notes (Addendum)
.   Progress Note  Patient Name: Kim Orr Date of Encounter: 05/07/2020  [facility] Cardiologist: Buford Dresser, MD   Subjective   TEE/DCCV scheduled for Tuesday but kept NPO overnight in case slot opened up - I called down to endoscopy and confirmed they are completely full today. Patient is disappointed to hear this. No CP or SOB. No cardiac awareness.  Inpatient Medications    Scheduled Meds: . apixaban  5 mg Oral BID  . furosemide  20 mg Intravenous Q12H  . gabapentin  300 mg Oral q AM  . gabapentin  600 mg Oral QHS  . methimazole  5 mg Oral Daily  . metoprolol tartrate  25 mg Oral Q6H  . senna-docusate  2 tablet Oral QHS  . simvastatin  20 mg Oral q1800   Continuous Infusions: . diltiazem (CARDIZEM) infusion Stopped (05/05/20 1140)   PRN Meds: acetaminophen, cyclobenzaprine, ondansetron (ZOFRAN) IV, oxyCODONE, polyethylene glycol   Vital Signs    Vitals:   05/07/20 0249 05/07/20 0433 05/07/20 0439 05/07/20 0753  BP:  94/76  129/77  Pulse:  (!) 104  (!) 117  Resp: (!) 22 20  (!) 25  Temp:  99 F (37.2 C)  99.7 F (37.6 C)  TempSrc:  Oral  Oral  SpO2:  92%  94%  Weight:   124.5 kg   Height:        Intake/Output Summary (Last 24 hours) at 05/07/2020 0755 Last data filed at 05/07/2020 0014 Gross per 24 hour  Intake 240 ml  Output 700 ml  Net -460 ml   Last 3 Weights 05/07/2020 05/06/2020 05/05/2020  Weight (lbs) 274 lb 7.6 oz 278 lb 281 lb 1.4 oz  Weight (kg) 124.5 kg 126.1 kg 127.5 kg      Telemetry    Atrial flutter rates 1teens variable conduction - Personally Reviewed  Physical Exam   GEN: No acute distress, obese WF. Neck: No JVD Cardiac: irregularly irregular, rate increased, no murmurs, rubs, or gallops.  Respiratory: Mildly decreased BS throughout without overt wheezes, rales or rhonchi, no accessory muscle use GI: Soft, nontender, non-distended  MS: Marked chronic appearing lymphedema bilaterally Neuro:  Nonfocal,  A+Ox3 Psych: Normal affect but tearful at times  Labs    High Sensitivity Troponin:   Recent Labs  Lab 05/03/20 1751 05/03/20 1950  TROPONINIHS 8 10      Chemistry Recent Labs  Lab 05/03/20 1751 05/04/20 0043 05/04/20 1128 05/05/20 0131 05/06/20 0342 05/07/20 0207  NA 141 140  --  140 141 140  K 4.2 3.1*   < > 3.7 3.7 3.4*  CL 104 103  --  105 104 103  CO2 31 33*  --  _0 GLUCOSE 107* 124*  --  99 104* 109*  BUN 12 12  --  _1 CREATININE 0.91 0.93  --  0.85 0.88 0.80  CALCIUM 9.4 8.6*  --  8.8* 8.8* 8.8*  PROT 6.0* 5.6*  --   --   --   --   ALBUMIN 3.8 3.3*  --   --   --   --   AST 16 15  --   --   --   --   ALT 11 10  --   --   --   --   ALKPHOS 47 42  --   --   --   --   BILITOT 0.6 0.6  --   --   --   --  GFRNONAA >60 >60  --  >60 >60 >60  ANIONGAP 6 4*  --  _0 < > = values in this interval not displayed.     Hematology Recent Labs  Lab 05/05/20 0131 05/06/20 0342 05/07/20 0207  WBC 8.1 7.1 9.0  RBC 4.33 4.29 4.64  HGB 14.3 14.3 15.5*  HCT 43.5 43.3 47.0*  MCV 100.5* 100.9* 101.3*  MCH 33.0 33.3 33.4  MCHC 32.9 33.0 33.0  RDW 14.2 13.9 13.9  PLT 153 156 173    BNP Recent Labs  Lab 05/03/20 1751  BNP 242.4*     DDimer  Recent Labs  Lab 05/04/20 0043  DDIMER 0.52*     Radiology    No results found.  Cardiac Studies   2D echo 05/07/20 1. Left ventricular ejection fraction, by estimation, is 55 to 60%. The  left ventricle has normal function. The left ventricle has no regional  wall motion abnormalities. Left ventricular diastolic function could not  be evaluated.  2. Right ventricular systolic function is mildly reduced. The right  ventricular size is moderately enlarged. There is normal pulmonary artery  systolic pressure.  3. The mitral valve is normal in structure. Moderate mitral valve  regurgitation. No evidence of mitral stenosis.  4. Tricuspid valve regurgitation is moderate.  5. The aortic valve is  tricuspid. Aortic valve regurgitation is not  visualized. Mild aortic valve sclerosis is present, with no evidence of  aortic valve stenosis.  6. The inferior vena cava is dilated in size with <50% respiratory  variability, suggesting right atrial pressure of 15 mmHg.   Patient Profile     80 y.o. female HTN, HLD, chronic back pain, lymphedema, obesity, and hyperthyroidism on methimazole presented with worsening lymphedema and tachycardia, found to be in atrial flutter. 2D Echo shows EF 55-60%, mod MR/TR, elevated RAP.  Assessment & Plan    1. New onset atrial flutter  - initially in 2:1 flutter of unknown chronicity - tolerated metoprolol better than diltiazem due to hypotension - has not been on diltiazem drip since 3/26 so will d/c off MAR - continue metoprolol 47m q6hr - CHADSVASC 4, started on apixaban 521mBID this admission - no room on endoscopy schedule for today so plan TEE/DCCV tomorrow with MAC - risks/benefits, indication and outcomes reviewed with patient who is agreeable to proceed - consider sleep study as OP (patient denies having this in the past)  2. Lymphedema (chronic) - being diuresed with IV Lasix 2015mID per primary team - this chronic problem makes volume assessment challenging but weight appears to be downtrending and I/O's -2.9L (not clear if accurate) - EF normal  3. Essential HTN - BP intermittently low, holding lisinopril - tolerates metoprolol better than diltiazem  4. Hyperlipidemia - continue home statin  4. Moderate mitral regurgitation/tricuspid regurgitation - continue to follow up as outpatient  5. Hyperthyroidism - on methimazole, TSH slightly elevated, will defer to primary team - needs to be a consideration if amiodarone considered in the future - would likely need EP consultation to help follow  6. Hypokalemia - will rx KCl 70m41mhis AM followed by 20me53mM - continue to reassess daily with Lasix plan  For questions or updates,  please contact [facility] consult [email or web address] for contact info under     Signed, DaynaCharlie PitterC  05/07/2020, 7:55 AM    The patient was seen, examined and discussed with DaynaMelina CopaC and I agree with the above.  The physical exam reveals clear lungs bilaterally, severe lymphedema bilaterally that is chronic, patient is not in acute distress, telemetry shows atrial flutter that is rate controlled with ventricular rates between 100-120 bpm.  She is scheduled for cardioversion for tomorrow.  She was started on Eliquis 5 mg p.o. twice daily CHA2DS2-VASc is 4, she remains on 20 mg IV twice daily, I will continue, -500 cc overnight, fattening is normal, will replace potassium. It is possible that her CHF was triggered by atrial flutter, we will send her home on her home dose of Lasix 40 mg p.o. twice daily.  We will arrange for early follow-up in our clinic.  Ena Dawley, MD 05/07/2020

## 2020-05-07 NOTE — H&P (View-Only) (Signed)
.   Progress Note  Patient Name: Kim Orr Date of Encounter: 05/07/2020  [facility] Cardiologist: Bridgette Christopher, MD   Subjective   TEE/DCCV scheduled for Tuesday but kept NPO overnight in case slot opened up - I called down to endoscopy and confirmed they are completely full today. Patient is disappointed to hear this. No CP or SOB. No cardiac awareness.  Inpatient Medications    Scheduled Meds: . apixaban  5 mg Oral BID  . furosemide  20 mg Intravenous Q12H  . gabapentin  300 mg Oral q AM  . gabapentin  600 mg Oral QHS  . methimazole  5 mg Oral Daily  . metoprolol tartrate  25 mg Oral Q6H  . senna-docusate  2 tablet Oral QHS  . simvastatin  20 mg Oral q1800   Continuous Infusions: . diltiazem (CARDIZEM) infusion Stopped (05/05/20 1140)   PRN Meds: acetaminophen, cyclobenzaprine, ondansetron (ZOFRAN) IV, oxyCODONE, polyethylene glycol   Vital Signs    Vitals:   05/07/20 0249 05/07/20 0433 05/07/20 0439 05/07/20 0753  BP:  94/76  129/77  Pulse:  (!) 104  (!) 117  Resp: (!) 22 20  (!) 25  Temp:  99 F (37.2 C)  99.7 F (37.6 C)  TempSrc:  Oral  Oral  SpO2:  92%  94%  Weight:   124.5 kg   Height:        Intake/Output Summary (Last 24 hours) at 05/07/2020 0755 Last data filed at 05/07/2020 0014 Gross per 24 hour  Intake 240 ml  Output 700 ml  Net -460 ml   Last 3 Weights 05/07/2020 05/06/2020 05/05/2020  Weight (lbs) 274 lb 7.6 oz 278 lb 281 lb 1.4 oz  Weight (kg) 124.5 kg 126.1 kg 127.5 kg      Telemetry    Atrial flutter rates 1teens variable conduction - Personally Reviewed  Physical Exam   GEN: No acute distress, obese WF. Neck: No JVD Cardiac: irregularly irregular, rate increased, no murmurs, rubs, or gallops.  Respiratory: Mildly decreased BS throughout without overt wheezes, rales or rhonchi, no accessory muscle use GI: Soft, nontender, non-distended  MS: Marked chronic appearing lymphedema bilaterally Neuro:  Nonfocal,  A+Ox3 Psych: Normal affect but tearful at times  Labs    High Sensitivity Troponin:   Recent Labs  Lab 05/03/20 1751 05/03/20 1950  TROPONINIHS 8 10      Chemistry Recent Labs  Lab 05/03/20 1751 05/04/20 0043 05/04/20 1128 05/05/20 0131 05/06/20 0342 05/07/20 0207  NA 141 140  --  140 141 140  K 4.2 3.1*   < > 3.7 3.7 3.4*  CL 104 103  --  105 104 103  CO2 31 33*  --  29 30 30  GLUCOSE 107* 124*  --  99 104* 109*  BUN 12 12  --  12 14 13  CREATININE 0.91 0.93  --  0.85 0.88 0.80  CALCIUM 9.4 8.6*  --  8.8* 8.8* 8.8*  PROT 6.0* 5.6*  --   --   --   --   ALBUMIN 3.8 3.3*  --   --   --   --   AST 16 15  --   --   --   --   ALT 11 10  --   --   --   --   ALKPHOS 47 42  --   --   --   --   BILITOT 0.6 0.6  --   --   --   --     GFRNONAA >60 >60  --  >60 >60 >60  ANIONGAP 6 4*  --  6 7 7   < > = values in this interval not displayed.     Hematology Recent Labs  Lab 05/05/20 0131 05/06/20 0342 05/07/20 0207  WBC 8.1 7.1 9.0  RBC 4.33 4.29 4.64  HGB 14.3 14.3 15.5*  HCT 43.5 43.3 47.0*  MCV 100.5* 100.9* 101.3*  MCH 33.0 33.3 33.4  MCHC 32.9 33.0 33.0  RDW 14.2 13.9 13.9  PLT 153 156 173    BNP Recent Labs  Lab 05/03/20 1751  BNP 242.4*     DDimer  Recent Labs  Lab 05/04/20 0043  DDIMER 0.52*     Radiology    No results found.  Cardiac Studies   2D echo 05/07/20 1. Left ventricular ejection fraction, by estimation, is 55 to 60%. The  left ventricle has normal function. The left ventricle has no regional  wall motion abnormalities. Left ventricular diastolic function could not  be evaluated.  2. Right ventricular systolic function is mildly reduced. The right  ventricular size is moderately enlarged. There is normal pulmonary artery  systolic pressure.  3. The mitral valve is normal in structure. Moderate mitral valve  regurgitation. No evidence of mitral stenosis.  4. Tricuspid valve regurgitation is moderate.  5. The aortic valve is  tricuspid. Aortic valve regurgitation is not  visualized. Mild aortic valve sclerosis is present, with no evidence of  aortic valve stenosis.  6. The inferior vena cava is dilated in size with <50% respiratory  variability, suggesting right atrial pressure of 15 mmHg.   Patient Profile     79 y.o. female HTN, HLD, chronic back pain, lymphedema, obesity, and hyperthyroidism on methimazole presented with worsening lymphedema and tachycardia, found to be in atrial flutter. 2D Echo shows EF 55-60%, mod MR/TR, elevated RAP.  Assessment & Plan    1. New onset atrial flutter  - initially in 2:1 flutter of unknown chronicity - tolerated metoprolol better than diltiazem due to hypotension - has not been on diltiazem drip since 3/26 so will d/c off MAR - continue metoprolol 25mg q6hr - CHADSVASC 4, started on apixaban 5mg BID this admission - no room on endoscopy schedule for today so plan TEE/DCCV tomorrow with MAC - risks/benefits, indication and outcomes reviewed with patient who is agreeable to proceed - consider sleep study as OP (patient denies having this in the past)  2. Lymphedema (chronic) - being diuresed with IV Lasix 20mg BID per primary team - this chronic problem makes volume assessment challenging but weight appears to be downtrending and I/O's -2.9L (not clear if accurate) - EF normal  3. Essential HTN - BP intermittently low, holding lisinopril - tolerates metoprolol better than diltiazem  4. Hyperlipidemia - continue home statin  4. Moderate mitral regurgitation/tricuspid regurgitation - continue to follow up as outpatient  5. Hyperthyroidism - on methimazole, TSH slightly elevated, will defer to primary team - needs to be a consideration if amiodarone considered in the future - would likely need EP consultation to help follow  6. Hypokalemia - will rx KCl 40meq this AM followed by 20meq QPM - continue to reassess daily with Lasix plan  For questions or updates,  please contact [facility] Please consult [email or web address] for contact info under     Signed, Dayna N Dunn, PA-C  05/07/2020, 7:55 AM    The patient was seen, examined and discussed with Dayna Dunn, PA-C and I agree with the above.     The physical exam reveals clear lungs bilaterally, severe lymphedema bilaterally that is chronic, patient is not in acute distress, telemetry shows atrial flutter that is rate controlled with ventricular rates between 100-120 bpm.  She is scheduled for cardioversion for tomorrow.  She was started on Eliquis 5 mg p.o. twice daily CHA2DS2-VASc is 4, she remains on 20 mg IV twice daily, I will continue, -500 cc overnight, fattening is normal, will replace potassium. It is possible that her CHF was triggered by atrial flutter, we will send her home on her home dose of Lasix 40 mg p.o. twice daily.  We will arrange for early follow-up in our clinic.  Vanessa Kampf, MD 05/07/2020  

## 2020-05-07 NOTE — NC FL2 (Signed)
[place] LEVEL OF CARE SCREENING TOOL     IDENTIFICATION  Patient Name: Kim Orr Birthdate: 25-Feb-1940 Sex: female Admission Date (Current Location): 05/03/2020  [facility] and [place] Number:  Herbalist and Address:  [facility] [place], [place] [address] [place],  [address]      Provider Number: [id number]  Attending Physician Name and Address:  Antonieta Pert, MD  Relative Name and Phone Number:  [name] [phone]    Current Level of Care: Hospital Recommended Level of Care: [place] Prior Approval Number:    Date Approved/Denied:   PASRR Number: [id number]  Discharge Plan: SNF    Current Diagnoses: Patient Active Problem List   Diagnosis Date Noted  . Atrial fibrillation with RVR ([place]) 05/04/2020  . Atrial flutter with rapid ventricular response ([place]) 05/03/2020  . Hyperthyroidism 05/03/2020  . Sciatica of left side associated with disorder of lumbosacral spine 05/03/2020  . Lymphedema of both lower extremities 05/03/2020  . Multinodular goiter 06/01/2018  . Essential hypertension 07/01/2017  . Mixed hyperlipidemia 07/01/2017  . Osteoarthritis 07/01/2017  . Hemorrhoids 03/16/2017    Orientation RESPIRATION BLADDER Height & Weight     Self,Time,Situation,Place (WDL)  Normal Incontinent,External catheter (External Urinary Catheter) Weight: 274 lb 7.6 oz (124.5 kg) Height:  5\' 11"  (180.3 cm)  BEHAVIORAL SYMPTOMS/MOOD NEUROLOGICAL BOWEL NUTRITION STATUS      Continent Diet (See Discharge Summary)  AMBULATORY STATUS COMMUNICATION OF NEEDS Skin   Extensive Assist Verbally Other (Comment) (Dry,Flaky,cellulitis leg,right,left,Ecchymosis arm bilateral,right,left)                       Personal Care Assistance Level of Assistance  Bathing,Feeding,Dressing Bathing Assistance: Maximum assistance Feeding assistance: Limited assistance (Needs set up) Dressing Assistance: Maximum assistance      Functional Limitations Info  Sight,Hearing,Speech Sight Info: Impaired Hearing Info: Adequate Speech Info: Adequate    SPECIAL CARE FACTORS FREQUENCY  PT (By licensed PT),OT (By licensed OT)     PT Frequency: 5x min weekly OT Frequency: 5x min weekly            Contractures Contractures Info: Not present    Additional Factors Info  Code Status Code Status Info: FULL             Current Medications (05/07/2020):  This is the current hospital active medication list Current Facility-Administered Medications  Medication Dose Route Frequency Provider Last Rate Last Admin  . 0.9 %  sodium chloride infusion   Intravenous Continuous Charlie Pitter, PA-C 20 mL/hr at 05/07/20 0901 New Bag at 05/07/20 0901  . acetaminophen (TYLENOL) tablet 650 mg  650 mg Oral Q4H PRN Vernelle Emerald, MD   650 mg at 05/05/20 1654  . apixaban (ELIQUIS) tablet 5 mg  5 mg Oral BID Vernelle Emerald, MD   5 mg at 05/07/20 0854  . cyclobenzaprine (FLEXERIL) tablet 10 mg  10 mg Oral TID PRN Vernelle Emerald, MD   10 mg at 05/07/20 0108  . diphenhydrAMINE (BENADRYL) capsule 25 mg  25 mg Oral Q6H PRN Kc, Ramesh, MD      . furosemide (LASIX) injection 20 mg  20 mg Intravenous Q12H Antonieta Pert, MD   20 mg at 05/07/20 0905  . gabapentin (NEURONTIN) capsule 300 mg  300 mg Oral q AM Shalhoub, Sherryll Burger, MD   300 mg at 05/07/20 0853  . gabapentin (NEURONTIN) capsule 600 mg  600 mg Oral QHS Inda Merlin  J, MD   600 mg at 05/06/20 2201  . methimazole (TAPAZOLE) tablet 5 mg  5 mg Oral Daily Kc, Ramesh, MD   5 mg at 05/07/20 0854  . metoprolol tartrate (LOPRESSOR) tablet 25 mg  25 mg Oral Q6H Buford Dresser, MD   25 mg at 05/07/20 0853  . ondansetron (ZOFRAN) injection 4 mg  4 mg Intravenous Q6H PRN Shalhoub, Sherryll Burger, MD      . oxyCODONE (Oxy IR/ROXICODONE) immediate release tablet 5 mg  5 mg Oral Q12H PRN Antonieta Pert, MD   5 mg at 05/07/20 0249  . polyethylene glycol (MIRALAX / GLYCOLAX) packet 17 g   17 g Oral Daily PRN Shalhoub, Sherryll Burger, MD      . potassium chloride SA (KLOR-CON) CR tablet 20 mEq  20 mEq Oral Once Dunn, Dayna N, PA-C      . senna-docusate (Senokot-S) tablet 2 tablet  2 tablet Oral Cloretta Ned, MD   2 tablet at 05/06/20 2203  . simvastatin (ZOCOR) tablet 20 mg  20 mg Oral q1800 Vernelle Emerald, MD   20 mg at 05/06/20 1706     Discharge Medications: Please see discharge summary for a list of discharge medications.  Relevant Imaging Results:  Relevant Lab Results:   Additional Information [id number]  Trula Ore, LCSWA

## 2020-05-07 NOTE — Evaluation (Signed)
Occupational Therapy Evaluation Patient Details Name: Kim Orr MRN: [id number] DOB: Nov 11, 1940 Today's Date: 05/07/2020    History of Present Illness 80 y.o. female admitted from PCP's office on 05/03/20 with atrial flutter with RVR, new onset; also some worsening edema. CXR with pulmonary edema.  Plan for TEE-CV on 3/29. PMH includes chronic pain syndrome, sciatica, HTN, lymphedema, morbid obesity.   Clinical Impression   PTA, pt was living with her husband at [place] and husband assisting with ADLs. Pt currently requiring Mod A for UB ADLs, Max A for LB ADLs, and Mod-Max A +2 for functional transfer to recliner. Pt presenting with increased anxiety impacting her functional performance and awareness. Pt would benefit from further acute OT to facilitate safe dc. Recommend dc to SNF for further OT to optimize safety, independence with ADLs, and return to PLOF.    Of note, OT evaluation completed on 3/25 and then order canceled by MD. Re-ordered on 3/28.    Follow Up Recommendations  SNF;Supervision/Assistance - 24 hour    Equipment Recommendations  None recommended by OT    Recommendations for Other Services PT consult     Precautions / Restrictions Precautions Precautions: Fall;Other (comment) Precaution Comments: Bladder incontinence; significant pain and anxiety Restrictions Weight Bearing Restrictions: No Other Position/Activity Restrictions: painful LE's      Mobility Bed Mobility Overal bed mobility: Needs Assistance Bed Mobility: Supine to Sit     Supine to sit: Max assist;+2 for safety/equipment;HOB elevated     General bed mobility comments: Cues for sequencing, maxA+2 for BLE management and trunk elevation; pt limited by c/o significant BLE pain and anxiety requiring frequent redirection and encouragemnet    Transfers Overall transfer level: Needs assistance Equipment used: Rolling walker (2 wheeled) Transfers: Sit to/from Stand Sit to Stand:  Max assist;+2 physical assistance;From elevated surface;+2 safety/equipment         General transfer comment: Mod-maxA+2 to assist trunk elevation standing from raised EOB; difficult to determine true physical limitations versus pt's significant anxiety with standing; pt bracing BLEs against bed for stability    Balance Overall balance assessment: Needs assistance Sitting-balance support: Feet supported;No upper extremity supported Sitting balance-Leahy Scale: Fair Sitting balance - Comments: Initial modA progressing to min guard seated balance with and without UE support   Standing balance support: Bilateral upper extremity supported Standing balance-Leahy Scale: Poor Standing balance comment: Reliant on BUE support and external assist                           ADL either performed or assessed with clinical judgement   ADL Overall ADL's : Needs assistance/impaired Eating/Feeding: Set up;Sitting   Grooming: Wash/dry face;Set up;Supervision/safety;Sitting   Upper Body Bathing: Moderate assistance;Sitting   Lower Body Bathing: Maximal assistance;Sitting/lateral leans;Sit to/from stand   Upper Body Dressing : Moderate assistance;Sitting   Lower Body Dressing: Maximal assistance;Sit to/from stand   Toilet Transfer: Moderate assistance;+2 for physical assistance;+2 for safety/equipment;Maximal assistance;Stand-pivot;RW (simulated to recliner)           Functional mobility during ADLs: Moderate assistance;Maximal assistance;+2 for physical assistance;+2 for safety/equipment;Rolling walker General ADL Comments: Pt with poor functional performance and highly impacting by high anxiety. Pt performing stand pivot to recliner with Mod-MaxA+2 and Max cues     Vision         Perception     Praxis      Pertinent Vitals/Pain Pain Assessment: Faces Faces Pain Scale: Hurts even more Pain Location: Everywhere -  especially BLEs with movement, LUE, back Pain Descriptors /  Indicators: Grimacing;Guarding;Moaning Pain Intervention(s): Monitored during session;Limited activity within patient's tolerance;Repositioned     Hand Dominance Right   Extremity/Trunk Assessment Upper Extremity Assessment Upper Extremity Assessment: Generalized weakness;LUE deficits/detail LUE Deficits / Details: Chronic L shoulder injury LUE Sensation: WNL LUE Coordination: WNL   Lower Extremity Assessment Lower Extremity Assessment: Defer to PT evaluation RLE Deficits / Details: Limited by c/o severe pain; functionally <3/5 throughout; redness and pitting edema bilateral lower leg/feet RLE: Unable to fully assess due to pain RLE Coordination: decreased gross motor;decreased fine motor LLE Deficits / Details: Limited by c/o severe pain; functionally <3/5 throughout; redness and pitting edema bilateral lower leg/feet LLE: Unable to fully assess due to pain LLE Coordination: decreased gross motor;decreased fine motor   Cervical / Trunk Assessment Cervical / Trunk Assessment: Kyphotic   Communication Communication Communication: No difficulties (mumbled speech at times)   Cognition Arousal/Alertness: Awake/alert Behavior During Therapy: Anxious Overall Cognitive Status: Impaired/Different from baseline Area of Impairment: Attention;Problem solving;Following commands;Awareness                   Current Attention Level: Selective   Following Commands: Follows multi-step commands with increased time   Awareness: Emergent Problem Solving: Requires verbal cues General Comments: Pt internally distracted by anxiety, requiring frequent redirection and encouragement. Pt fluctuates between being appreciative of mobilizing (Had been asking to get OOB), then with multiple complaints and states, "I'd rather die than go through this hell"   General Comments  HR up to 150, BP 132/104. RN to discuss anxiety meds with MD as this seems to be a major limiting factor for mobility  progression    Exercises     Shoulder Instructions      Home Living Family/patient expects to be discharged to:: Private residence Living Arrangements: Spouse/significant other Available Help at Discharge: Family;Available 24 hours/day Type of Home: Independent living facility Home Access: Elevator     Home Layout: One level     Bathroom Shower/Tub: Occupational psychologist: Handicapped height Bathroom Accessibility: Yes How Accessible: Accessible via wheelchair Home Equipment: [place] - 4 wheels;Shower seat;Grab bars - toilet;Grab bars - tub/shower;Hand held shower head;Adaptive equipment;Wheelchair - manual;Hospital bed;Other (comment) Adaptive Equipment: [place] Additional Comments: Resides at [place] apt with her husband who uses a RW as well      Prior Functioning/Environment Level of Independence: Needs assistance  Gait / Transfers Assistance Needed: Mod indep for household distances with RW; uses w/c for community mobility in ILF building and outside ADL's / Homemaking Assistance Needed: Husband assists with ADLs; amount of assist varies depending on pt's pain. Husband does all cooking and cleaning            OT Problem List:        OT Treatment/Interventions: Self-care/ADL training;Therapeutic exercise;DME and/or AE instruction;Therapeutic activities;Patient/family education;Balance training    OT Goals(Current goals can be found in the care plan section) Acute Rehab OT Goals Patient Stated Goal: Willing to consider post-acute SNF rehab if at [facility] OT Goal Formulation: With patient Time For Goal Achievement: 05/18/20 Potential to Achieve Goals: Good ADL Goals Pt Will Perform Grooming: with supervision;sitting;standing Pt Will Perform Upper Body Bathing: with set-up;sitting;standing Pt Will Perform Upper Body Dressing: with set-up;sitting;standing Pt Will Transfer to Toilet: with modified independence;ambulating;bedside commode Pt Will  Perform Toileting - Clothing Manipulation and hygiene: with supervision;sit to/from stand  OT Frequency: Min 2X/week   Barriers to D/C:  Co-evaluation PT/OT/SLP Co-Evaluation/Treatment: Yes Reason for Co-Treatment: For patient/therapist safety;To address functional/ADL transfers PT goals addressed during session: Mobility/safety with mobility;Balance;Proper use of DME OT goals addressed during session: ADL's and self-care      AM-PAC OT "6 Clicks" Daily Activity     Outcome Measure Help from another person eating meals?: None Help from another person taking care of personal grooming?: A Little Help from another person toileting, which includes using toliet, bedpan, or urinal?: A Lot Help from another person bathing (including washing, rinsing, drying)?: A Lot Help from another person to put on and taking off regular upper body clothing?: A Little Help from another person to put on and taking off regular lower body clothing?: A Lot 6 Click Score: 16   End of Session Equipment Utilized During Treatment: Rolling walker Nurse Communication: Need for lift equipment  Activity Tolerance: Patient tolerated treatment well Patient left: with call bell/phone within reach;in chair;with chair alarm set  OT Visit Diagnosis: Unsteadiness on feet (R26.81);Muscle weakness (generalized) (M62.81);Pain Pain - Right/Left: Left Pain - part of body: Leg                Time: 8325-4982 OT Time Calculation (min): 32 min Charges:  OT General Charges $OT Visit: 1 Visit OT Evaluation $OT Eval Moderate Complexity: [place], OTR/L Acute Rehab Pager: [phone] Office: [place] 05/07/2020, 2:08 PM

## 2020-05-07 NOTE — TOC Progression Note (Signed)
Transition of Care [facility]) - Progression Note    Patient Details  Name: Kim Orr MRN: [id number] Date of Birth: 02-15-1940  [facility] CM/SW [place], [place] Phone Number: 05/07/2020, 3:46 PM  Clinical Narrative:     CSW spoke with Claiborne Billings with [facility] who confirmed she can accept patient for SNF placement. Claiborne Billings will start insurance authorization for patient.   Patient has SNF bed at [facility]. Insurance authorization is pending.  CSW will continue to follow.    Expected Discharge Plan: [place] Barriers to Discharge: Continued Medical Work up  Expected Discharge Plan and Services Expected Discharge Plan: [place] In-house Referral: Clinical Social Work     Living arrangements for the past 2 months: [place] Nurse, adult)                                       Social Determinants of Health (SDOH) Interventions    Readmission Risk Interventions No flowsheet data found.

## 2020-05-08 ENCOUNTER — Encounter (HOSPITAL_COMMUNITY)
Admission: EM | Disposition: A | Discharge status: Skilled Nursing Facility | Payer: Self-pay | Source: Home / Self Care | Attending: Internal Medicine

## 2020-05-08 ENCOUNTER — Inpatient Hospital Stay (HOSPITAL_COMMUNITY): Payer: Medicare HMO

## 2020-05-08 ENCOUNTER — Inpatient Hospital Stay (HOSPITAL_COMMUNITY): Payer: Medicare HMO | Admitting: Anesthesiology

## 2020-05-08 ENCOUNTER — Encounter (HOSPITAL_COMMUNITY): Payer: Self-pay | Admitting: Internal Medicine

## 2020-05-08 DIAGNOSIS — I4892 Unspecified atrial flutter: Secondary | ICD-10-CM

## 2020-05-08 DIAGNOSIS — I890 Lymphedema, not elsewhere classified: Secondary | ICD-10-CM

## 2020-05-08 DIAGNOSIS — E0590 Thyrotoxicosis, unspecified without thyrotoxic crisis or storm: Secondary | ICD-10-CM | POA: Diagnosis not present

## 2020-05-08 DIAGNOSIS — I340 Nonrheumatic mitral (valve) insufficiency: Secondary | ICD-10-CM

## 2020-05-08 DIAGNOSIS — I361 Nonrheumatic tricuspid (valve) insufficiency: Secondary | ICD-10-CM | POA: Diagnosis not present

## 2020-05-08 DIAGNOSIS — I10 Essential (primary) hypertension: Secondary | ICD-10-CM | POA: Diagnosis not present

## 2020-05-08 HISTORY — PX: TEE WITHOUT CARDIOVERSION: SHX5443

## 2020-05-08 HISTORY — PX: CARDIOVERSION: SHX1299

## 2020-05-08 LAB — BASIC METABOLIC PANEL
Anion gap: 9 (ref 5–15)
BUN: 18 mg/dL (ref 8–23)
CO2: 27 mmol/L (ref 22–32)
Calcium: 9.2 mg/dL (ref 8.9–10.3)
Chloride: 105 mmol/L (ref 98–111)
Creatinine, Ser: 0.97 mg/dL (ref 0.44–1.00)
GFR, Estimated: 59 mL/min — ABNORMAL LOW (ref >60)
Glucose, Bld: 114 mg/dL — ABNORMAL HIGH (ref 70–99)
Potassium: 3.9 mmol/L (ref 3.5–5.1)
Sodium: 141 mmol/L (ref 135–145)

## 2020-05-08 LAB — CBC
HCT: 46.7 % — ABNORMAL HIGH (ref 36.0–46.0)
Hemoglobin: 15.8 g/dL — ABNORMAL HIGH (ref 12.0–15.0)
MCH: 34.1 pg — ABNORMAL HIGH (ref 26.0–34.0)
MCHC: 33.8 g/dL (ref 30.0–36.0)
MCV: 100.6 fL — ABNORMAL HIGH (ref 80.0–100.0)
Platelets: 192 10*3/uL (ref 150–400)
RBC: 4.64 MIL/uL (ref 3.87–5.11)
RDW: 14 % (ref 11.5–15.5)
WBC: 10 10*3/uL (ref 4.0–10.5)
nRBC: 0 % (ref 0.0–0.2)

## 2020-05-08 LAB — MAGNESIUM: Magnesium: 2.1 mg/dL (ref 1.7–2.4)

## 2020-05-08 SURGERY — ECHOCARDIOGRAM, TRANSESOPHAGEAL
Anesthesia: Monitor Anesthesia Care

## 2020-05-08 MED ORDER — PHENYLEPHRINE HCL (PRESSORS) 10 MG/ML IV SOLN
INTRAVENOUS | Status: DC | PRN
  Administered 2020-05-08: 120 ug via INTRAVENOUS
  Administered 2020-05-08 (×2): 80 ug via INTRAVENOUS
  Administered 2020-05-08: 120 ug via INTRAVENOUS

## 2020-05-08 MED ORDER — FUROSEMIDE 10 MG/ML IJ SOLN
40.0000 mg | Freq: Two times a day (BID) | INTRAMUSCULAR | Status: DC
  Administered 2020-05-08 – 2020-05-09 (×2): 40 mg via INTRAVENOUS
  Filled 2020-05-08 (×2): qty 4

## 2020-05-08 MED ORDER — POTASSIUM CHLORIDE CRYS ER 20 MEQ PO TBCR
40.0000 meq | EXTENDED_RELEASE_TABLET | Freq: Every morning | ORAL | Status: DC
  Administered 2020-05-08 – 2020-05-11 (×4): 40 meq via ORAL
  Filled 2020-05-08 (×4): qty 2

## 2020-05-08 MED ORDER — ENSURE ENLIVE PO LIQD: 237.0000 mL | Freq: Two times a day (BID) | ORAL | Status: DC

## 2020-05-08 MED ORDER — POTASSIUM CHLORIDE CRYS ER 20 MEQ PO TBCR
20.0000 meq | EXTENDED_RELEASE_TABLET | Freq: Two times a day (BID) | ORAL | Status: DC
  Filled 2020-05-08: qty 1

## 2020-05-08 MED ORDER — PROPOFOL 10 MG/ML IV BOLUS
INTRAVENOUS | Status: DC | PRN
  Administered 2020-05-08: 10 mg via INTRAVENOUS
  Administered 2020-05-08 (×3): 20 mg via INTRAVENOUS
  Administered 2020-05-08: 30 mg via INTRAVENOUS

## 2020-05-08 MED ORDER — LIDOCAINE 2% (20 MG/ML) 5 ML SYRINGE
INTRAMUSCULAR | Status: DC | PRN
  Administered 2020-05-08: 40 mg via INTRAVENOUS
  Administered 2020-05-08: 20 mg via INTRAVENOUS

## 2020-05-08 NOTE — Anesthesia Procedure Notes (Addendum)
Procedure Name: General with mask airway Date/Time: 05/08/2020 11:36 AM Performed by: Renato Shin, CRNA Pre-anesthesia Checklist: Patient identified, Emergency Drugs available, Suction available, Patient being monitored and Timeout performed Patient Re-evaluated:Patient Re-evaluated prior to induction Oxygen Delivery Method: Ambu bag Preoxygenation: Pre-oxygenation with 100% oxygen Induction Type: IV induction Airway Equipment and Method: Patient positioned with wedge pillow and Bite block Placement Confirmation: positive ETCO2 and breath sounds checked- equal and bilateral Dental Injury: Teeth and Oropharynx as per pre-operative assessment

## 2020-05-08 NOTE — Op Note (Signed)
INDICATIONS: atrial flutter  PROCEDURE:   Informed consent was obtained prior to the procedure. The risks, benefits and alternatives for the procedure were discussed and the patient comprehended these risks.  Risks include, but are not limited to, cough, sore throat, vomiting, nausea, somnolence, esophageal and stomach trauma or perforation, bleeding, low blood pressure, aspiration, pneumonia, infection, trauma to the teeth and death.    During this procedure the patient was administered IV propofol by Anesthesiology, Dr. Deatra Canter. The transesophageal probe was inserted in the esophagus and stomach without difficulty and multiple views were obtained.  The patient was kept under observation until the patient left the procedure room.  The patient left the procedure room in stable condition.   Agitated microbubble saline contrast was not administered.  COMPLICATIONS:    There were no immediate complications.  FINDINGS:  NO left atrial thrombus.  LVEF 35%, global hypokinesis. Prominent paradoxical septal motion suggests RV overload. No major valvular abnormalities, pericardial effusion or aortic atherosclerosis.  RECOMMENDATIONS:     Proceed with DCCV  Time Spent Directly with the Patient:  30 minutes   Evalise Abruzzese 05/08/2020, 11:42 AM

## 2020-05-08 NOTE — Transfer of Care (Signed)
Immediate Anesthesia Transfer of Care Note  Patient: Kim Orr  Procedure(s) Performed: TRANSESOPHAGEAL ECHOCARDIOGRAM (TEE) (N/A ) CARDIOVERSION (N/A )  Patient Location: PACU and Endoscopy Unit  Anesthesia Type:General  Level of Consciousness: drowsy and patient cooperative  Airway & Oxygen Therapy: Patient Spontanous Breathing and Patient connected to nasal cannula oxygen  Post-op Assessment: Report given to RN and Post -op Vital signs reviewed and stable  Post vital signs: Reviewed and stable  Last Vitals:  Vitals Value Taken Time  BP    Temp    Pulse 72 05/08/20 1201  Resp 21 05/08/20 1201  SpO2 100 % 05/08/20 1201  Vitals shown include unvalidated device data.  Last Pain:  Vitals:   05/08/20 1105  TempSrc: Axillary  PainSc: 7       Patients Stated Pain Goal: 0 (83/35/82 5189)  Complications: No complications documented.

## 2020-05-08 NOTE — Care Management Important Message (Signed)
Important Message  Patient Details  Name: CINTHYA BORS MRN: [id number] Date of Birth: 1940-03-17   Medicare Important Message Given:  Yes     Shelda Altes 05/08/2020, 2:02 PM

## 2020-05-08 NOTE — Op Note (Signed)
Procedure: Electrical Cardioversion Indications:  Atrial Flutter  Procedure Details:  Consent: Risks of procedure as well as the alternatives and risks of each were explained to the (patient/caregiver).  Consent for procedure obtained.  Time Out: Verified patient identification, verified procedure, site/side was marked, verified correct patient position, special equipment/implants available, medications/allergies/relevent history reviewed, required imaging and test results available.  Performed  Patient placed on cardiac monitor, pulse oximetry, supplemental oxygen as necessary.  Sedation given: propofol IV, Dr. Deatra Canter Pacer pads placed anterior and posterior chest.  Cardioverted 1 time(s).  Cardioversion with synchronized biphasic 120J shock.  Evaluation: Findings: Post procedure EKG shows: NSR Complications: None Patient did tolerate procedure well.  Time Spent Directly with the Patient:  30 minutes   Kim Orr 05/08/2020, 11:44 AM

## 2020-05-08 NOTE — Anesthesia Preprocedure Evaluation (Signed)
Anesthesia Evaluation  Patient identified by MRN, date of birth, ID band Patient awake    Reviewed: Allergy & Precautions, NPO status , Patient's Chart, lab work & pertinent test results  Airway Mallampati: II  TM Distance: >3 FB     Dental  (+) Dental Advisory Given   Pulmonary neg pulmonary ROS,    breath sounds clear to auscultation       Cardiovascular hypertension, Pt. on medications + dysrhythmias Atrial Fibrillation + Valvular Problems/Murmurs MR  Rhythm:Irregular Rate:Normal     Neuro/Psych  Neuromuscular disease    GI/Hepatic negative GI ROS, Neg liver ROS,   Endo/Other  Hyperthyroidism   Renal/GU negative Renal ROS     Musculoskeletal  (+) Arthritis ,   Abdominal   Peds  Hematology negative hematology ROS (+)   Anesthesia Other Findings   Reproductive/Obstetrics                             Anesthesia Physical Anesthesia Plan  ASA: III  Anesthesia Plan: MAC   Post-op Pain Management:    Induction:   PONV Risk Score and Plan: 2 and Propofol infusion and Treatment may vary due to age or medical condition  Airway Management Planned: Natural Airway and Nasal Cannula  Additional Equipment:   Intra-op Plan:   Post-operative Plan:   Informed Consent: I have reviewed the patients History and Physical, chart, labs and discussed the procedure including the risks, benefits and alternatives for the proposed anesthesia with the patient or authorized representative who has indicated his/her understanding and acceptance.       Plan Discussed with:   Anesthesia Plan Comments:         Anesthesia Quick Evaluation

## 2020-05-08 NOTE — Progress Notes (Signed)
PROGRESS NOTE    Kim Orr  AOZ:[id number] DOB: [birth year] DOA: 05/03/2020 PCP: Lajean Manes, MD   Chief Complaint  Patient presents with  . Tachycardia  Brief Narrative: 80 year old female history of hypothyroidism on methimazole, chronic pain syndrome secondary to chronic low back pain sciatica, hypertension, lymphedema, hyperlipidemia sent to ED by primary care doctor for new onset tachyarrhythmia. Patient was admitted for a flutter, cardiology was consulted.  Patient was placed on Cardizem drip, anticoagulation is started with Eliquis Followed by cardiology very closely diuresed with IV Lasix  Subjective: Seen and examined this morning appears anxious complains of back pain. Heart rate remains uncontrolled. Just came back from chest x-ray. No leukocytosis renal function is stable, low-grade temp 100.4  Assessment & Plan:  Atrial flutter with rapid ventricular rate, new onset:  w/ worsening leg edema.Patient was placed on Cardizem drip, anticoagulation is started with Eliquis-started on metoprolol 12.5 mg q6h 3/26- off cardizem gtt 3/26-continue metoprolol at 25 mg every 6 hours per cardiology.  Work-up with-TSH stable, TTE lvef 55-60%, Troponin negative x2 at 8-10, D-dimer at 0.52 corrected for age is normal. Patient is at high risk of decompensation and waiting for TEE cardioversion this morning.  Appreciate cardiology input.  Continue to monitor in telemetry.  Discussed with the nursing staff, she had cardioversion and maintaining NSR.  Lymphedema/peripheral edema chronic with some worsening past week- baseline wt 260lb in December, on admission 128kg/282 lb with diuresis but is improving nicely as below > 122kg now. CXR on admission-some pulmonary edema: BNP borderline to 242.  Suspecting in the setting of a flutter she had some worsening edema.  Normally she is on Lasix 40 mg twice daily at home cont iv lasix 20 mg q12h- Echo with  Right ventricle systolic function mildly  reduced, EF 55 to 60% on echocardiogram diastolic function could not be evaluated, has moderate MR. Continue to monitor intake output and daily weight as below which is improving.  Net IO Since Admission: -2,812.77 mL [05/08/20 0805]  Filed Weights   05/06/20 0345 05/07/20 0439 05/08/20 0304  Weight: 126.1 kg 124.5 kg 122.7 kg   Moderate MR/moderate TR will need outpatient follow-up  Hypokalemia: Repleted/normalized  Essential hypertension: Blood pressure is stable, it was soft previously so lisinopril was discontinued.  Now on metoprolol for rate control. Monitor.  Hyperthyroidism:on methimazole with a stable TSH.    Chronic low back pain/sciatica/chronic pain continue as needed oxycodone - at q12h prn- dose not use much pain meds at home per daughter.Cont her  muscle relaxant Neurontin.  Anxiety issues, continue bedtime Ambien new, patient did not take Valium with discontinued  Mixed hyperlipidemia:on Zocor.  Morbid obesity BMI 38: patient will benefit with PCP follow-up,weight loss.   Deconditioning: Continue PT OT, anticipating SNF placement.    Anxiety issues nursing reports it is limiting her ADLs, ambien prn nightly. Benadryl prn  Diet Order            Diet NPO time specified Except for: Sips with Meds  Diet effective midnight               Patient's Body mass index is 37.71 kg/m.  DVT prophylaxis: eliquis Code Status:   Code Status: Full Code  Family Communication: plan of care discussed with patient at bedside. She reports she has been updating her daughter who is a Marine scientist I had called and updated her daughter previously ) she has been reviewing notes in [place] and is aware of plan. Pt self updating.  Status is: Inpatient Patient remains hospitalized for ongoing management of A. flutter with RVR, worsening leg edema and further work-up with cardiology.  Dispo: The patient is from: Home              Anticipated d/c is to: SNF              Patient currently is  not medically stable to d/c.   Difficult to place patient No Unresulted Labs (From admission, onward)          Start     Ordered   05/08/20 0947  Basic metabolic panel  Daily,   R     Question:  Specimen collection method  Answer:  Lab=Lab collect   05/07/20 0803         Medications reviewed:  Scheduled Meds: . apixaban  5 mg Oral BID  . furosemide  20 mg Intravenous Q12H  . gabapentin  300 mg Oral q AM  . gabapentin  600 mg Oral QHS  . methimazole  5 mg Oral Daily  . metoprolol tartrate  25 mg Oral Q6H  . senna-docusate  2 tablet Oral QHS  . simvastatin  20 mg Oral q1800   Continuous Infusions: . sodium chloride Stopped (05/07/20 1015)    Consultants:see note  Procedures:see note  Antimicrobials: Anti-infectives (From admission, onward)   None     Culture/Microbiology No results found for: SDES, SPECREQUEST, CULT, REPTSTATUS  Other culture-see note  Objective: Vitals: Today's Vitals   05/08/20 0300 05/08/20 0304 05/08/20 0345 05/08/20 0508  BP:  103/74    Pulse:    (!) 110  Resp:  (!) 25    Temp:  98.7 F (37.1 C)    TempSrc:  Oral    SpO2:  93%    Weight:  122.7 kg    Height:      PainSc: 10-Worst pain ever  Asleep     Intake/Output Summary (Last 24 hours) at 05/08/2020 0805 Last data filed at 05/08/2020 0311 Gross per 24 hour  Intake 1017.01 ml  Output 900 ml  Net 117.01 ml   Filed Weights   05/06/20 0345 05/07/20 0439 05/08/20 0304  Weight: 126.1 kg 124.5 kg 122.7 kg   Weight change: -1.847 kg  Intake/Output from previous day: 03/28 0701 - 03/29 0700 In: 1017 [P.O.:990; I.V.:27] Out: 900 [Urine:900] Intake/Output this shift: No intake/output data recorded. Filed Weights   05/06/20 0345 05/07/20 0439 05/08/20 0304  Weight: 126.1 kg 124.5 kg 122.7 kg    Examination: General exam: AAO, anxious on room air,  HEENT:Oral mucosa moist, Ear/Nose WNL grossly, dentition normal. Respiratory system: bilaterally diminishedd,no wheezing or  crackles,no use of accessory muscle Cardiovascular system: S1 & S2 + irregularly irregular and tachycardic Gastrointestinal system: Abdomen soft, Obese,NT,ND, BS+ Nervous System:Alert, awake, moving extremities and grossly nonfocal Extremities: Bilateral extensive lymphedema chronic appearing.  Skin: No rashes,no icterus. MSK: Normal muscle bulk,tone, power   Data Reviewed: I have personally reviewed following labs and imaging studies CBC: Recent Labs  Lab 05/04/20 0043 05/05/20 0131 05/06/20 0342 05/07/20 0207 05/08/20 0241  WBC 8.5 8.1 7.1 9.0 10.0  NEUTROABS 5.9  --   --   --   --   HGB 14.0 14.3 14.3 15.5* 15.8*  HCT 42.3 43.5 43.3 47.0* 46.7*  MCV 101.2* 100.5* 100.9* 101.3* 100.6*  PLT 148* 153 156 173 096   Basic Metabolic Panel: Recent Labs  Lab 05/03/20 1751 05/04/20 0043 05/04/20 1128 05/05/20 0131 05/06/20 0342 05/07/20 0207 05/08/20 0241  NA 141 140  --  140 141 140 141  K 4.2 3.1* 3.6 3.7 3.7 3.4* 3.9  CL 104 103  --  105 104 103 105  CO2 31 33*  --  29 30 30 27   GLUCOSE 107* 124*  --  99 104* 109* 114*  BUN 12 12  --  12 14 13 18   CREATININE 0.91 0.93  --  0.85 0.88 0.80 0.97  CALCIUM 9.4 8.6*  --  8.8* 8.8* 8.8* 9.2  MG 2.2 2.0  --   --   --   --  2.1   GFR: Estimated Creatinine Clearance: 66.9 mL/min (by C-G formula based on SCr of 0.97 mg/dL). Liver Function Tests: Recent Labs  Lab 05/03/20 1751 05/04/20 0043  AST 16 15  ALT 11 10  ALKPHOS 47 42  BILITOT 0.6 0.6  PROT 6.0* 5.6*  ALBUMIN 3.8 3.3*   No results for input(s): LIPASE, AMYLASE in the last 168 hours. No results for input(s): AMMONIA in the last 168 hours. Coagulation Profile: No results for input(s): INR, PROTIME in the last 168 hours. Cardiac Enzymes: No results for input(s): CKTOTAL, CKMB, CKMBINDEX, TROPONINI in the last 168 hours. BNP (last 3 results) No results for input(s): PROBNP in the last 8760 hours. HbA1C: No results for input(s): HGBA1C in the last 72  hours. CBG: Recent Labs  Lab 05/07/20 0750  GLUCAP 100*   Lipid Profile: No results for input(s): CHOL, HDL, LDLCALC, TRIG, CHOLHDL, LDLDIRECT in the last 72 hours. Thyroid Function Tests: No results for input(s): TSH, T4TOTAL, FREET4, T3FREE, THYROIDAB in the last 72 hours. Anemia Panel: No results for input(s): VITAMINB12, FOLATE, FERRITIN, TIBC, IRON, RETICCTPCT in the last 72 hours. Sepsis Labs: No results for input(s): PROCALCITON, LATICACIDVEN in the last 168 hours.  Recent Results (from the past 240 hour(s))  SARS CORONAVIRUS 2 (TAT 6-24 HRS) Nasopharyngeal Nasopharyngeal Swab     Status: None   Collection Time: 05/03/20  6:42 PM   Specimen: Nasopharyngeal Swab  Result Value Ref Range Status   SARS Coronavirus 2 NEGATIVE NEGATIVE Final    Comment: (NOTE) SARS-CoV-2 target nucleic acids are NOT DETECTED.  The SARS-CoV-2 RNA is generally detectable in upper and lower respiratory specimens during the acute phase of infection. Negative results do not preclude SARS-CoV-2 infection, do not rule out co-infections with other pathogens, and should not be used as the sole basis for treatment or other patient management decisions. Negative results must be combined with clinical observations, patient history, and epidemiological information. The expected result is Negative.  Fact Sheet for Patients: SugarRoll.be  Fact Sheet for Healthcare Providers: https://www.woods-mathews.com/  This test is not yet approved or cleared by the [place] FDA and  has been authorized for detection and/or diagnosis of SARS-CoV-2 by FDA under an Emergency Use Authorization (EUA). This EUA will remain  in effect (meaning this test can be used) for the duration of the COVID-19 declaration under Se ction 564(b)(1) of the Act, 21 U.S.C. section 360bbb-3(b)(1), unless the authorization is terminated or revoked sooner.  Performed at [facility], [facility] [address] [place], [place] [address]      Radiology Studies: No results found.   LOS: 4 days   Antonieta Pert, MD [facility]  05/08/2020, 8:05 AM

## 2020-05-08 NOTE — Plan of Care (Signed)
°  Problem: Clinical Measurements: °Goal: Ability to maintain clinical measurements within normal limits will improve °Outcome: Progressing °Goal: Will remain free from infection °Outcome: Progressing °Goal: Diagnostic test results will improve °Outcome: Progressing °Goal: Respiratory complications will improve °Outcome: Progressing °  °

## 2020-05-08 NOTE — Progress Notes (Signed)
Per d/w Dr. Meda Coffee, have tentatively arranged follow-up in afib clinic Friday May 18, 2020. Appt info placed on AVS.

## 2020-05-08 NOTE — Interval H&P Note (Signed)
History and Physical Interval Note:  05/08/2020 10:40 AM  Kim Orr  has presented today for surgery, with the diagnosis of Aflutter.  The various methods of treatment have been discussed with the patient and family. After consideration of risks, benefits and other options for treatment, the patient has consented to  Procedure(s): TRANSESOPHAGEAL ECHOCARDIOGRAM (TEE) (N/A) CARDIOVERSION (N/A) as a surgical intervention.  The patient's history has been reviewed, patient examined, no change in status, stable for surgery.  I have reviewed the patient's chart and labs.  Questions were answered to the patient's satisfaction.     [name]

## 2020-05-08 NOTE — Progress Notes (Signed)
  Echocardiogram Echocardiogram Transesophageal has been performed.  Kim Orr 05/08/2020, 12:34 PM

## 2020-05-08 NOTE — Progress Notes (Addendum)
Progress Note  Patient Name: Kim Orr Date of Encounter: 05/08/2020  Primary Cardiologist: Buford Dresser, MD  Subjective   No complaints today. Temp charted of 100.4 yesterday afternoon. Afebrile overnight and this AM. Per chart will be going to SNF post DC. No cough reported.  Inpatient Medications    Scheduled Meds: . apixaban  5 mg Oral BID  . furosemide  20 mg Intravenous Q12H  . gabapentin  300 mg Oral q AM  . gabapentin  600 mg Oral QHS  . methimazole  5 mg Oral Daily  . metoprolol tartrate  25 mg Oral Q6H  . senna-docusate  2 tablet Oral QHS  . simvastatin  20 mg Oral q1800   Continuous Infusions: . sodium chloride Stopped (05/07/20 1015)   PRN Meds: acetaminophen, cyclobenzaprine, diphenhydrAMINE, ondansetron (ZOFRAN) IV, oxyCODONE, polyethylene glycol   Vital Signs    Vitals:   05/07/20 2318 05/08/20 0304 05/08/20 0508 05/08/20 0815  BP: (!) 127/55 103/74    Pulse: (!) 116  (!) 110 (!) 111  Resp: (!) 21 (!) 25  (!) 24  Temp: 98.7 F (37.1 C) 98.7 F (37.1 C)  98.5 F (36.9 C)  TempSrc: Oral Oral  Oral  SpO2: 100% 93%    Weight:  122.7 kg    Height:        Intake/Output Summary (Last 24 hours) at 05/08/2020 0828 Last data filed at 05/08/2020 0311 Gross per 24 hour  Intake 1017.01 ml  Output 900 ml  Net 117.01 ml   Last 3 Weights 05/08/2020 05/07/2020 05/06/2020  Weight (lbs) 270 lb 6.4 oz 274 lb 7.6 oz 278 lb  Weight (kg) 122.653 kg 124.5 kg 126.1 kg     Telemetry    Atrial flutter with rates 110-150 BPM, occasional abberrancy - Personally Reviewed  Physical Exam   GEN: No acute distress, obese WF, chronically debilitated appearing HEENT: Normocephalic, atraumatic, sclera non-icteric. Neck: No JVD or bruits. Cardiac: irregularly irregular, rate increased, no murmurs, rubs, or gallops.  Respiratory: Moderate air movement, no rales or rhonchi. Rare quiet expiratory wheezing. Breathing is unlabored. GI: Soft, nontender,  non-distended, BS +x 4. MS: no acute deformity. Extremities: Marked chronic appearing lymphedema bilaterally Neuro:  AAOx3. Follows commands. Psych:  Responds to questions appropriately with a normal affect.  Labs    High Sensitivity Troponin:   Recent Labs  Lab 05/03/20 1751 05/03/20 1950  TROPONINIHS 8 10      Cardiac EnzymesNo results for input(s): TROPONINI in the last 168 hours. No results for input(s): TROPIPOC in the last 168 hours.   Chemistry Recent Labs  Lab 05/03/20 1751 05/04/20 0043 05/04/20 1128 05/06/20 0342 05/07/20 0207 05/08/20 0241  NA 141 140   < > 141 140 141  K 4.2 3.1*   < > 3.7 3.4* 3.9  CL 104 103   < > 104 103 105  CO2 31 33*   < > _0 GLUCOSE 107* 124*   < > 104* 109* 114*  BUN 12 12   < > _1 CREATININE 0.91 0.93   < > 0.88 0.80 0.97  CALCIUM 9.4 8.6*   < > 8.8* 8.8* 9.2  PROT 6.0* 5.6*  --   --   --   --   ALBUMIN 3.8 3.3*  --   --   --   --   AST 16 15  --   --   --   --   ALT 11 10  --   --   --   --  ALKPHOS 47 42  --   --   --   --   BILITOT 0.6 0.6  --   --   --   --   GFRNONAA >60 >60   < > >60 >60 59*  ANIONGAP 6 4*   < > _0 < > = values in this interval not displayed.     Hematology Recent Labs  Lab 05/06/20 0342 05/07/20 0207 05/08/20 0241  WBC 7.1 9.0 10.0  RBC 4.29 4.64 4.64  HGB 14.3 15.5* 15.8*  HCT 43.3 47.0* 46.7*  MCV 100.9* 101.3* 100.6*  MCH 33.3 33.4 34.1*  MCHC 33.0 33.0 33.8  RDW 13.9 13.9 14.0  PLT 156 173 192    BNP Recent Labs  Lab 05/03/20 1751  BNP 242.4*     DDimer  Recent Labs  Lab 05/04/20 0043  DDIMER 0.52*     Radiology    No results found.  Cardiac Studies   2D echo 05/07/20 1. Left ventricular ejection fraction, by estimation, is 55 to 60%. The  left ventricle has normal function. The left ventricle has no regional  wall motion abnormalities. Left ventricular diastolic function could not  be evaluated.  2. Right ventricular systolic function is  mildly reduced. The right  ventricular size is moderately enlarged. There is normal pulmonary artery  systolic pressure.  3. The mitral valve is normal in structure. Moderate mitral valve  regurgitation. No evidence of mitral stenosis.  4. Tricuspid valve regurgitation is moderate.  5. The aortic valve is tricuspid. Aortic valve regurgitation is not  visualized. Mild aortic valve sclerosis is present, with no evidence of  aortic valve stenosis.  6. The inferior vena cava is dilated in size with <50% respiratory  variability, suggesting right atrial pressure of 15 mmHg.   Patient Profile   80 y.o. female HTN, HLD, chronic back pain,lymphedema, obesity,and hyperthyroidismon methimazole presented with worsening lymphedema and tachycardia, found to be in atrial flutter. 2D Echo shows EF 55-60%, mod MR/TR, elevated RAP.  Assessment & Plan    1. New onset atrial flutter  - initially in 2:1 flutter of unknown chronicity - tolerated metoprolol better than diltiazem due to hypotension - has not been on diltiazem drip since 3/26 so will d/c off MAR - continue metoprolol 30m q6hr for now, plan TBD post cardioversion - follow pulm status - CHADSVASC 4, started on apixaban 548mBID this admission - plan TEE/DCCV today with MAC - discussed risks/benefits, indication and outcomes yesterday with patient who is agreeable to proceed, she has no new questions today - consider sleep study as OP (patient denies having this in the past)  2. Lymphedema (chronic) - being diuresed with IV Lasix 2024mID per primary team - this chronic problem makes volume assessment challenging but weight appears to be downtrending and I/O's -2.8L (not clear if I/Os accurate) - EF normal - Dr. NelMeda Coffeeggests Lasix 45m43mD at discharge  3. Fever yesterday - had isolated temp yesterday at 100.4, afebrile otherwise including this AM (last Tylenol 3am) - O2 sat ranging 93-100% RA - will obtain CXR, otherwise defer to  IM - per d/w MD, continue with plan for TEE/DCCV today as planned  4. Essential HTN - BP intermittently soft, holding lisinopril - tolerates metoprolol better than diltiazem  5. Hyperlipidemia - continue home statin  6. Moderate mitral regurgitation/tricuspid regurgitation - continue to follow up as outpatient  7. Hyperthyroidism - on methimazole, TSH slightly elevated, will defer to primary  team - needs to be a consideration if amiodarone considered in the future - would likely need EP consultation to help follow  8. Hypokalemia - improved with supplementation, will order 44mq BID for now to continue while she is on IV Lasix - consider decreasing once back on PO  Will discuss follow-up plan with MD - afib clinic vs NL follow-up. Will be going to SNF in the meantime.   For questions or updates, please contact CBinfordPlease consult [email or web address] for contact info under Cardiology/STEMI.  Signed, DCharlie Pitter PA-C 05/08/2020, 8:28 AM    The patient was seen, examined and discussed with DMelina Copa PA-C and I agree with the above.   She remains in atrial flutter with RVR, heart rates up to 150 bpm, she also remains fluid overloaded, I's and O's are not recorded accurately as her weight is going down, however she has crackles in her lungs and significant lymphedema, also have significant CHF on her chest x-ray from yesterday.  I will increase her Lasix to 40 mg IV twice daily, hopefully she starts diuresing better once she is in sinus rhythm.  She had 1 isolated episode of fever yesterday but is afebrile today.  We will plan cardioversion.  KEna Dawley MD 05/08/2020

## 2020-05-09 DIAGNOSIS — I10 Essential (primary) hypertension: Secondary | ICD-10-CM | POA: Diagnosis not present

## 2020-05-09 DIAGNOSIS — I890 Lymphedema, not elsewhere classified: Secondary | ICD-10-CM | POA: Diagnosis not present

## 2020-05-09 DIAGNOSIS — E0590 Thyrotoxicosis, unspecified without thyrotoxic crisis or storm: Secondary | ICD-10-CM | POA: Diagnosis not present

## 2020-05-09 DIAGNOSIS — I4892 Unspecified atrial flutter: Secondary | ICD-10-CM | POA: Diagnosis not present

## 2020-05-09 LAB — CBC
HCT: 45.1 % (ref 36.0–46.0)
Hemoglobin: 14.3 g/dL (ref 12.0–15.0)
MCH: 33.1 pg (ref 26.0–34.0)
MCHC: 31.7 g/dL (ref 30.0–36.0)
MCV: 104.4 fL — ABNORMAL HIGH (ref 80.0–100.0)
Platelets: 204 10*3/uL (ref 150–400)
RBC: 4.32 MIL/uL (ref 3.87–5.11)
RDW: 13.7 % (ref 11.5–15.5)
WBC: 8.1 10*3/uL (ref 4.0–10.5)
nRBC: 0 % (ref 0.0–0.2)

## 2020-05-09 LAB — BASIC METABOLIC PANEL
Anion gap: 8 (ref 5–15)
BUN: 23 mg/dL (ref 8–23)
CO2: 31 mmol/L (ref 22–32)
Calcium: 8.9 mg/dL (ref 8.9–10.3)
Chloride: 101 mmol/L (ref 98–111)
Creatinine, Ser: 1.05 mg/dL — ABNORMAL HIGH (ref 0.44–1.00)
GFR, Estimated: 54 mL/min — ABNORMAL LOW (ref >60)
Glucose, Bld: 118 mg/dL — ABNORMAL HIGH (ref 70–99)
Potassium: 4.1 mmol/L (ref 3.5–5.1)
Sodium: 140 mmol/L (ref 135–145)

## 2020-05-09 MED ORDER — FUROSEMIDE 80 MG PO TABS
80.0000 mg | ORAL_TABLET | Freq: Two times a day (BID) | ORAL | Status: DC
  Administered 2020-05-09 – 2020-05-11 (×5): 80 mg via ORAL
  Filled 2020-05-09 (×5): qty 1

## 2020-05-09 MED ORDER — SENNA 8.6 MG PO TABS
1.0000 | ORAL_TABLET | Freq: Two times a day (BID) | ORAL | Status: DC
  Administered 2020-05-09 – 2020-05-11 (×5): 8.6 mg via ORAL
  Filled 2020-05-09 (×5): qty 1

## 2020-05-09 MED ORDER — BISACODYL 10 MG RE SUPP
10.0000 mg | Freq: Once | RECTAL | Status: AC
  Administered 2020-05-09: 10 mg via RECTAL
  Filled 2020-05-09: qty 1

## 2020-05-09 NOTE — TOC Progression Note (Addendum)
Transition of Care [facility]) - Progression Note    Patient Details  Name: Kim Orr MRN: [id number] Date of Birth: 01-12-1941  Transition of [facility]) CM/SW [place], [place] Phone Number: 05/09/2020, 1:19 PM  Clinical Narrative:      CSW spoke with Claiborne Billings from [place]. Claiborne Billings confirmed with CSW that patients insurance authorization is still pending. Claiborne Billings confimed she will let CSW know when she gets insurance approval.MD informed.  Patient has SN[facility] bed at [facility]. Insurance authorization is pending. CSW will continue to follow and assist with discharge planning needs.   Expected Discharge Plan: [place] Barriers to Discharge: Continued Medical Work up  Expected Discharge Plan and Services Expected Discharge Plan: [place] In-house Referral: Clinical Social Work     Living arrangements for the past 2 months: [place] Nurse, adult)                                       Social Determinants of Health (SDOH) Interventions    Readmission Risk Interventions No flowsheet data found.

## 2020-05-09 NOTE — Progress Notes (Signed)
Occupational Therapy Treatment Patient Details Name: Kim Orr MRN: [id number] DOB: Jul 28, 1940 Today's Date: 05/09/2020    History of present illness 80 y.o. female admitted from PCP's office on 05/03/20 with atrial flutter with RVR, new onset; also some worsening edema. CXR with pulmonary edema.  Plan for TEE-CV on 3/29. PMH includes chronic pain syndrome, sciatica, HTN, lymphedema, morbid obesity.   OT comments  Patient presented in bed at this time. Pt reported that there is a bed aligned for her where she lives in [place] setting for rehab post hospital stay. Pt required max assist to roll with use of bed rails. Pt required set up for oral care and set up to min assist with face washing/ UE washing. Patient required max assist to complete abduction/adduction for reposition BLE in bed.     Follow Up Recommendations  SNF;Supervision/Assistance - 24 hour    Equipment Recommendations  None recommended by OT    Recommendations for Other Services      Precautions / Restrictions Precautions Precautions: Fall;Other (comment) Precaution Comments: Bladder incontinence; significant pain and anxiety Restrictions Weight Bearing Restrictions: No       Mobility Bed Mobility Overal bed mobility: Needs Assistance Bed Mobility: Rolling Rolling: Max assist              Transfers                      Balance                                           ADL either performed or assessed with clinical judgement   ADL Overall ADL's : Needs assistance/impaired Eating/Feeding: Bed level;Sitting;Set up   Grooming: Wash/dry hands;Wash/dry face;Bed level;Minimal assistance                                 General ADL Comments: Pt at this time completed in a sitting bed level position as max to total to roll side to side in bed     Vision       Perception     Praxis      Cognition Arousal/Alertness: Awake/alert                                      General Comments: Pt did not seem to have any axiety in this session        Exercises     Shoulder Instructions       General Comments      Pertinent Vitals/ Pain       Pain Assessment: Faces Faces Pain Scale: Hurts even more Pain Location: headache, LEs Pain Descriptors / Indicators: Grimacing;Guarding;Moaning Pain Intervention(s): Limited activity within patient's tolerance;Monitored during session  Home Living                                          Prior Functioning/Environment              Frequency  Min 2X/week        Progress Toward Goals  OT Goals(current goals can now be found in the care plan section)  Progress  towards OT goals: Progressing toward goals  Acute Rehab OT Goals Patient Stated Goal: reported they have a place to go to rehab post hospital stay OT Goal Formulation: With patient Time For Goal Achievement: 05/18/20 Potential to Achieve Goals: Good ADL Goals Pt Will Perform Grooming: with supervision;sitting;standing Pt Will Perform Upper Body Bathing: with set-up;sitting;standing Pt Will Perform Upper Body Dressing: with set-up;sitting;standing Pt Will Transfer to Toilet: with modified independence;ambulating;bedside commode Pt Will Perform Toileting - Clothing Manipulation and hygiene: with supervision;sit to/from stand  Plan Discharge plan remains appropriate    Co-evaluation                 AM-PAC OT "6 Clicks" Daily Activity     Outcome Measure   Help from another person eating meals?: None Help from another person taking care of personal grooming?: A Little Help from another person toileting, which includes using toliet, bedpan, or urinal?: Total Help from another person bathing (including washing, rinsing, drying)?: Total Help from another person to put on and taking off regular upper body clothing?: Total Help from another person to put on and taking off regular lower body  clothing?: Total 6 Click Score: 11    End of Session Equipment Utilized During Treatment: Rolling walker  OT Visit Diagnosis: Unsteadiness on feet (R26.81);Muscle weakness (generalized) (M62.81);Pain   Activity Tolerance Patient tolerated treatment well   Patient Left in bed;with call bell/phone within reach   Nurse Communication Need for lift equipment        Time: 1040-1104 OT Time Calculation (min): 24 min  Charges: OT General Charges $OT Visit: 1 Visit OT Treatments $Self Care/Home Management : 23-37 mins  Joeseph Amor OTR/L  Acute Rehab Services  [phone] office number [phone] pager number    Joeseph Amor 05/09/2020, 11:33 AM

## 2020-05-09 NOTE — Progress Notes (Addendum)
Progress Note  Patient Name: Kim Orr Date of Encounter: 05/09/2020  Primary Cardiologist: Buford Dresser, MD  Subjective   Feeling well, no complaints except mild headache.  Inpatient Medications    Scheduled Meds: . apixaban  5 mg Oral BID  . furosemide  40 mg Intravenous BID  . gabapentin  300 mg Oral q AM  . gabapentin  600 mg Oral QHS  . methimazole  5 mg Oral Daily  . metoprolol tartrate  25 mg Oral Q6H  . potassium chloride  40 mEq Oral q AM  . senna-docusate  2 tablet Oral QHS  . simvastatin  20 mg Oral q1800   Continuous Infusions:  PRN Meds: acetaminophen, cyclobenzaprine, diphenhydrAMINE, ondansetron (ZOFRAN) IV, oxyCODONE, polyethylene glycol   Vital Signs    Vitals:   05/08/20 1623 05/08/20 2019 05/09/20 0007 05/09/20 0454  BP: 123/79 121/75 118/71 114/72  Pulse: 69 66 62 63  Resp:  16 (!) 24 (!) 23  Temp:  98.1 F (36.7 C)  98.1 F (36.7 C)  TempSrc:  Oral  Oral  SpO2:  97% 93% 95%  Weight:    126 kg  Height:        Intake/Output Summary (Last 24 hours) at 05/09/2020 0756 Last data filed at 05/08/2020 1151 Gross per 24 hour  Intake 100 ml  Output --  Net 100 ml   Last 3 Weights 05/09/2020 05/08/2020 05/08/2020  Weight (lbs) 277 lb 12.5 oz 270 lb 6.4 oz 270 lb 6.4 oz  Weight (kg) 126 kg 122.653 kg 122.653 kg     Telemetry    NSR - Personally Reviewed  Physical Exam   GEN: No acute distress, obese WF, more lively today HEENT: Normocephalic, atraumatic, sclera non-icteric. Neck: No JVD or bruits. Cardiac: RRR no murmurs, rubs, or gallops.  Respiratory: Clear to auscultation bilaterally. Breathing is unlabored. GI: Soft, nontender, non-distended, BS +x 4. MS: no acute deformity. Some ecchymosis to right inner elbow Extremities: No clubbing or cyanosis Marked chronic appearing lymphedema bilaterally. Neuro:  AAOx3. Follows commands. Psych:  Responds to questions appropriately with a normal affect.  Labs    High  Sensitivity Troponin:   Recent Labs  Lab 05/03/20 1751 05/03/20 1950  TROPONINIHS 8 10      Cardiac EnzymesNo results for input(s): TROPONINI in the last 168 hours. No results for input(s): TROPIPOC in the last 168 hours.   Chemistry Recent Labs  Lab 05/03/20 1751 05/04/20 0043 05/04/20 1128 05/07/20 0207 05/08/20 0241 05/09/20 0407  NA 141 140   < > 140 141 140  K 4.2 3.1*   < > 3.4* 3.9 4.1  CL 104 103   < > 103 105 101  CO2 31 33*   < > 30 27 31   GLUCOSE 107* 124*   < > 109* 114* 118*  BUN 12 12   < > 13 18 23   CREATININE 0.91 0.93   < > 0.80 0.97 1.05*  CALCIUM 9.4 8.6*   < > 8.8* 9.2 8.9  PROT 6.0* 5.6*  --   --   --   --   ALBUMIN 3.8 3.3*  --   --   --   --   AST 16 15  --   --   --   --   ALT 11 10  --   --   --   --   ALKPHOS 47 42  --   --   --   --   BILITOT  0.6 0.6  --   --   --   --   GFRNONAA >60 >60   < > >60 59* 54*  ANIONGAP 6 4*   < > 7 9 8    < > = values in this interval not displayed.     Hematology Recent Labs  Lab 05/07/20 0207 05/08/20 0241 05/09/20 0407  WBC 9.0 10.0 8.1  RBC 4.64 4.64 4.32  HGB 15.5* 15.8* 14.3  HCT 47.0* 46.7* 45.1  MCV 101.3* 100.6* 104.4*  MCH 33.4 34.1* 33.1  MCHC 33.0 33.8 31.7  RDW 13.9 14.0 13.7  PLT 173 192 204    BNP Recent Labs  Lab 05/03/20 1751  BNP 242.4*     DDimer  Recent Labs  Lab 05/04/20 0043  DDIMER 0.52*     Radiology    DG Chest 2 View  Result Date: 05/08/2020 CLINICAL DATA:  Fever EXAM: CHEST - 2 VIEW COMPARISON:  May 03, 2020 FINDINGS: Lungs are clear. Heart is enlarged with pulmonary vascularity normal, stable. No adenopathy. No bone lesions. IMPRESSION: Stable cardiac prominence.  No edema or airspace opacity. Electronically Signed   By: Lowella Grip III M.D.   On: 05/08/2020 11:37   ECHO TEE  Result Date: 05/08/2020    TRANSESOPHOGEAL ECHO REPORT   Patient Name:   Kim Orr Date of Exam: 05/08/2020 Medical Rec #:  [id number]        Height:       71.0 in  Accession #:    [id number]       Weight:       270.4 lb Date of Birth:  1940-12-14       BSA:          2.397 m Patient Age:    80 years         BP:           94/72 mmHg Patient Gender: F                HR:           145 bpm. Exam Location:  Inpatient Procedure: Transesophageal Echo, Cardiac Doppler and Color Doppler Indications:     Atrial flutter  History:         Patient has prior history of Echocardiogram examinations, most                  recent 05/04/2020. Arrythmias:Atrial Flutter; Risk                  Factors:Hypertension and Dyslipidemia.  Sonographer:     Clayton Lefort RDCS (AE) Referring Phys:  [id number] [place] Diagnosing Phys: Sanda Klein MD PROCEDURE: After discussion of the risks and benefits of a TEE, an informed consent was obtained from the patient. The transesophogeal probe was passed without difficulty through the esophogus of the patient. Sedation performed by different physician. The patient was monitored while under deep sedation. Anesthestetic sedation was provided intravenously by Anesthesiology: 100mg  of Propofol, 60mg  of Lidocaine. Image quality was good. The patient developed no complications during the procedure. A successful direct current cardioversion was performed at 120 joules with 1 attempt. IMPRESSIONS  1. Left ventricular ejection fraction, by estimation, is 35 to 40%. The left ventricle has moderately decreased function.  2. Right ventricular systolic function is moderately reduced. The right ventricular size is mildly enlarged. There is normal pulmonary artery systolic pressure.  3. Left atrial size was severely dilated. No left atrial/left atrial appendage thrombus  was detected. The LAA emptying velocity was 60 cm/s.  4. The pericardial effusion is circumferential.  5. The mitral valve is normal in structure. Mild to moderate mitral valve regurgitation.  6. Tricuspid valve regurgitation is moderate.  7. The aortic valve is tricuspid. Aortic valve regurgitation is not  visualized. Mild aortic valve sclerosis is present, with no evidence of aortic valve stenosis.  8. There is mild (Grade II) atheroma plaque. Comparison(s): Compared with 05/04/2020, the left ventricular systolic function has worsened (heart rate on today's study is 145 bpm, as opposed to 73 bpm on the 3/25 study). FINDINGS  Left Ventricle: Left ventricular ejection fraction, by estimation, is 35 to 40%. The left ventricle has moderately decreased function. The left ventricular internal cavity size was normal in size. There is no left ventricular hypertrophy. Right Ventricle: The right ventricular size is mildly enlarged. No increase in right ventricular wall thickness. Right ventricular systolic function is moderately reduced. There is normal pulmonary artery systolic pressure. The tricuspid regurgitant velocity is 1.92 m/s, and with an assumed right atrial pressure of 15 mmHg, the estimated right ventricular systolic pressure is 93.2 mmHg. Left Atrium: Left atrial size was severely dilated. No left atrial/left atrial appendage thrombus was detected. The LAA emptying velocity was 60 cm/s. Right Atrium: Right atrial size was normal in size. Pericardium: Trivial pericardial effusion is present. The pericardial effusion is circumferential. Mitral Valve: The mitral valve is normal in structure. Mild to moderate mitral valve regurgitation, with centrally-directed jet. Tricuspid Valve: The tricuspid valve is grossly normal. Tricuspid valve regurgitation is moderate. Aortic Valve: The aortic valve is tricuspid. Aortic valve regurgitation is not visualized. Mild aortic valve sclerosis is present, with no evidence of aortic valve stenosis. Pulmonic Valve: The pulmonic valve was grossly normal. Pulmonic valve regurgitation is not visualized. Aorta: The aortic root, ascending aorta and aortic arch are all structurally normal, with no evidence of dilitation or obstruction. There is mild (Grade II) atheroma plaque. IAS/Shunts:  There is redundancy of the interatrial septum. No atrial level shunt detected by color flow Doppler. There is no evidence of an atrial septal defect.  TRICUSPID VALVE TR Peak grad:   14.7 mmHg TR Vmax:        192.00 cm/s Sanda Klein MD Electronically signed by Sanda Klein MD Signature Date/Time: 05/08/2020/3:17:15 PM    Final     Cardiac Studies   2D echo 05/07/20 1. Left ventricular ejection fraction, by estimation, is 55 to 60%. The  left ventricle has normal function. The left ventricle has no regional  wall motion abnormalities. Left ventricular diastolic function could not  be evaluated.  2. Right ventricular systolic function is mildly reduced. The right  ventricular size is moderately enlarged. There is normal pulmonary artery  systolic pressure.  3. The mitral valve is normal in structure. Moderate mitral valve  regurgitation. No evidence of mitral stenosis.  4. Tricuspid valve regurgitation is moderate.  5. The aortic valve is tricuspid. Aortic valve regurgitation is not  visualized. Mild aortic valve sclerosis is present, with no evidence of  aortic valve stenosis.  6. The inferior vena cava is dilated in size with <50% respiratory  variability, suggesting right atrial pressure of 15 mmHg.   Patient Profile     80 y.o. female with HTN, HLD, chronic back pain,lymphedema, obesity,and hyperthyroidismon methimazole presented with worsening lymphedema and tachycardia, found to be in atrial flutter. 2D Echo 05/07/20 showed EF 55-60%, mod MR/TR, elevated RAP. TEE 05/08/20 showed EF 35% (going fast at  that time).  Assessment & Plan    1. New onset atrial flutter  - initially in 2:1 flutter of unknown chronicity - tolerated metoprolol better than diltiazem due to hypotension - CHADSVASC 4, started on apixaban 5mg  BID this admission - s/p TEE/DCCV 05/08/20 and maintaining NSR - will discuss transition to consolidated metoprolol with MD - consider sleep study as OP(patient  denies having this in the past)  2. Acute combined CHF / Lymphedema (chronic) - 2D echo 3/28 EF normal but appeared more like 35-40% by TEE at time of tachycardia, possibly tachy-mediated - wheezing resolved with higher dose of IV Lasix - weight up ?accurate, I/O's not complete - will review further med plans with MD - consider resumption of low dose ACEi vs ARB (was on lisinopril as OP). Spironolactone could also be a consideration. Do not think her BP will support Entresto at this time  3. Fever 3/28 - had isolated temp earlier this admission at 100.4, afebrile otherwise including this AM (last Tylenol 3am) - O2 sat ranging 93-100% RA - CXR without acute disease - afebrile since then  4. Essential HTN - follow in context of the above  5. Hyperlipidemia - continue home statin  6. Moderate mitral regurgitation/tricuspid regurgitation - continue to follow up as outpatient  7. Hyperthyroidism - on methimazole,TSH slightly elevated, willdefer to primary team - needs to be a consideration if amiodarone considered in the future- would likely need EP consultation to help follow  8. Hypokalemia - improved   Afib clinic scheduled 4/8. Also tentatively arranged f/u with Dr. Harrell Gave for about a month after that on May 9th. Appt info placed on AVS.  For questions or updates, please contact [facility] Please consult [email or web address] for contact info under Cardiology/STEMI.  Signed, Charlie Pitter, PA-C 05/09/2020, 7:56 AM    The patient was seen, examined and discussed with Melina Copa, PA-C and I agree with the above.   The patient remains in sinus rhythm post cardioversion yesterday, she is feeling great looking best she has in the last few days, she has residual significant bilateral lower extremity edema that is chronic lymphedema potentially myxedema related to her hyper thyroidism.  She is on methimazole.  She is on Eliquis for anticoagulation, we will switch her IV  Lasix to Lasix p.o. 80 mg BID, we will arrange for follow-up in our clinic she is scheduled on April 8 in our [place] clinic followed by general cardiology follow-up with Dr. Harrell Gave on May 9.  Ena Dawley, MD 05/09/2020

## 2020-05-09 NOTE — Anesthesia Postprocedure Evaluation (Signed)
Anesthesia Post Note  Patient: Kim Orr  Procedure(s) Performed: TRANSESOPHAGEAL ECHOCARDIOGRAM (TEE) (N/A ) CARDIOVERSION (N/A )     Patient location during evaluation: PACU Anesthesia Type: General Level of consciousness: awake and alert Pain management: pain level controlled Vital Signs Assessment: post-procedure vital signs reviewed and stable Respiratory status: spontaneous breathing, nonlabored ventilation, respiratory function stable and patient connected to nasal cannula oxygen Cardiovascular status: blood pressure returned to baseline and stable Postop Assessment: no apparent nausea or vomiting Anesthetic complications: no   No complications documented.  Last Vitals:  Vitals:   05/09/20 0832 05/09/20 1147  BP: 122/85 117/63  Pulse: 70 64  Resp:  20  Temp:  37.6 C  SpO2:  94%    Last Pain:  Vitals:   05/09/20 1302  TempSrc:   PainSc: 5                  Tiajuana Amass

## 2020-05-09 NOTE — Progress Notes (Signed)
PROGRESS NOTE    Kim Orr  DQQ:[id number] DOB: 09-Feb-1941 DOA: 05/03/2020 PCP: Lajean Manes, MD   Chief Complaint  Patient presents with  . Tachycardia  Brief Narrative: 80 year old female history of hypothyroidism on methimazole, chronic pain syndrome secondary to chronic low back pain sciatica, hypertension, lymphedema, hyperlipidemia sent to ED by primary care doctor for new onset tachyarrhythmia. Patient was admitted for a flutter, cardiology was consulted.  Patient was placed on Cardizem drip, anticoagulation is started with Eliquis Followed by cardiology very closely diuresed with IV Lasix  Subjective: Seen and examined this morning appears anxious complains of back pain. Heart rate remains uncontrolled. Just came back from chest x-ray. No leukocytosis renal function is stable, low-grade temp 100.4  Assessment & Plan:  Atrial flutter with rapid ventricular rate, new onset: S/p TEE DCCV 3/29 and maintaining normal sinus rhythm.  Now transitioned to PO metoprolol, Eliquis.  Volume status stable although has chronic lymphedema.  Echo with EF normal troponin negative x2..  Acute combined CHF/lymphedema/peripheral edema chronic with some worsening past week due to RVR.baseline wt 260lb in December, on admission 128kg/282 lb-nicely improved to 122 with significant diuresis.  Changing to p.o. Lasix 80 mg twice daily, continue to monitor intake output Daily weight and will need outpatient follow-up with the CHF clinic appointment scheduled for May 9.  Net IO Since Admission: -2,712.77 mL [05/09/20 1316]  Filed Weights   05/08/20 0304 05/08/20 1105 05/09/20 0454  Weight: 122.7 kg 122.7 kg 126 kg   Moderate MR/moderate TR outpatient follow-up  Hypokalemia: Improved.  Continue potassium 40 M EQ daily while on Lasix Recent Labs  Lab 05/05/20 0131 05/06/20 0342 05/07/20 0207 05/08/20 0241 05/09/20 0407  K 3.7 3.7 3.4* 3.9 4.1   Essential hypertension: Controlled continue  metoprolol.  Off lisinopril due to soft blood pressure   Hyperthyroidism:on methimazole with a stable TSH.    Chronic low back pain/sciatica/chronic pain continue as needed oxycodone - at q12h prn- dose not use much pain meds at home per daughter.Cont her  muscle relaxant and her Neurontin.  Anxiety issues, continue supportive measure Ambien bedtime   Mixed hyperlipidemia: Continue on Zocor.  Morbid obesity BMI 38:Patient will benefit with PCP follow-up,weight loss.   Deconditioning: Cont PT/OT awAiting SNF  Diet Order            Diet Heart Room service appropriate? Yes; Fluid consistency: Thin  Diet effective now               Patient's Body mass index is 38.74 kg/m.  DVT prophylaxis: eliquis Code Status:   Code Status: Full Code  Family Communication: plan of care discussed with patient at bedside. She reports she has been updating her daughter who is a nurse-I had discussed with her previously, seh is also reviewing her in my chart  Status is: Inpatient Patient remains hospitalized for ongoing management of A. flutter with RVR, worsening leg edema and further work-up with cardiology.  Dispo: The patient is from: Home              Anticipated d/c is to: SNF, authorization in process.              Patient currently is medically stable for discharge pending authorization for SNF.   Difficult to place patient No Unresulted Labs (From admission, onward)          Start     Ordered   05/10/20 0500  CBC  Tomorrow morning,   R  Question:  Specimen collection method  Answer:  Lab=Lab collect   05/09/20 0818   05/08/20 3500  Basic metabolic panel  Daily,   R     Question:  Specimen collection method  Answer:  Lab=Lab collect   05/07/20 0803         Medications reviewed:  Scheduled Meds: . apixaban  5 mg Oral BID  . furosemide  80 mg Oral BID  . gabapentin  300 mg Oral q AM  . gabapentin  600 mg Oral QHS  . methimazole  5 mg Oral Daily  . metoprolol tartrate  25 mg  Oral Q6H  . potassium chloride  40 mEq Oral q AM  . senna  1 tablet Oral BID  . simvastatin  20 mg Oral q1800   Continuous Infusions:   Consultants:see note  Procedures:see note  Antimicrobials: Anti-infectives (From admission, onward)   None     Culture/Microbiology No results found for: SDES, SPECREQUEST, CULT, REPTSTATUS  Other culture-see note  Objective: Vitals: Today's Vitals   05/09/20 0917 05/09/20 1147 05/09/20 1217 05/09/20 1302  BP:  117/63    Pulse:  64    Resp:  20    Temp:  99.6 F (37.6 C)    TempSrc:  Oral    SpO2:  94%    Weight:      Height:      PainSc: 4   9  5     No intake or output data in the 24 hours ending 05/09/20 1316 Filed Weights   05/08/20 0304 05/08/20 1105 05/09/20 0454  Weight: 122.7 kg 122.7 kg 126 kg   Weight change: 0 kg  Intake/Output from previous day: 03/29 0701 - 03/30 0700 In: 100 [I.V.:100] Out: -  Intake/Output this shift: No intake/output data recorded. Filed Weights   05/08/20 0304 05/08/20 1105 05/09/20 0454  Weight: 122.7 kg 122.7 kg 126 kg    Examination: General exam: AAOx3, weak, obese,NAD, weak appearing. HEENT:Oral mucosa moist, Ear/Nose WNL grossly, dentition normal. Respiratory system: bilaterally diminished,no wheezing or crackles,no use of accessory muscle Cardiovascular system: S1 & S2 +, No JVD,. Gastrointestinal system: Abdomen soft, NT,ND, BS+ Nervous System:Alert, awake, moving extremities and grossly nonfocal Extremities: Chronic appearing lymphedema, distal peripheral pulses palpable.  Skin: No rashes,no icterus. MSK: Normal muscle bulk,tone, power  Data Reviewed: I have personally reviewed following labs and imaging studies CBC: Recent Labs  Lab 05/04/20 0043 05/05/20 0131 05/06/20 0342 05/07/20 0207 05/08/20 0241 05/09/20 0407  WBC 8.5 8.1 7.1 9.0 10.0 8.1  NEUTROABS 5.9  --   --   --   --   --   HGB 14.0 14.3 14.3 15.5* 15.8* 14.3  HCT 42.3 43.5 43.3 47.0* 46.7* 45.1  MCV  101.2* 100.5* 100.9* 101.3* 100.6* 104.4*  PLT 148* 153 156 173 192 938   Basic Metabolic Panel: Recent Labs  Lab 05/03/20 1751 05/04/20 0043 05/04/20 1128 05/05/20 0131 05/06/20 0342 05/07/20 0207 05/08/20 0241 05/09/20 0407  NA 141 140  --  140 141 140 141 140  K 4.2 3.1*   < > 3.7 3.7 3.4* 3.9 4.1  CL 104 103  --  105 104 103 105 101  CO2 31 33*  --  29 30 30 27 31   GLUCOSE 107* 124*  --  99 104* 109* 114* 118*  BUN 12 12  --  12 14 13 18 23   CREATININE 0.91 0.93  --  0.85 0.88 0.80 0.97 1.05*  CALCIUM 9.4 8.6*  --  8.8* 8.8* 8.8* 9.2 8.9  MG 2.2 2.0  --   --   --   --  2.1  --    < > = values in this interval not displayed.   GFR: Estimated Creatinine Clearance: 62.7 mL/min (A) (by C-G formula based on SCr of 1.05 mg/dL (H)). Liver Function Tests: Recent Labs  Lab 05/03/20 1751 05/04/20 0043  AST 16 15  ALT 11 10  ALKPHOS 47 42  BILITOT 0.6 0.6  PROT 6.0* 5.6*  ALBUMIN 3.8 3.3*   No results for input(s): LIPASE, AMYLASE in the last 168 hours. No results for input(s): AMMONIA in the last 168 hours. Coagulation Profile: No results for input(s): INR, PROTIME in the last 168 hours. Cardiac Enzymes: No results for input(s): CKTOTAL, CKMB, CKMBINDEX, TROPONINI in the last 168 hours. BNP (last 3 results) No results for input(s): PROBNP in the last 8760 hours. HbA1C: No results for input(s): HGBA1C in the last 72 hours. CBG: Recent Labs  Lab 05/07/20 0750  GLUCAP 100*   Lipid Profile: No results for input(s): CHOL, HDL, LDLCALC, TRIG, CHOLHDL, LDLDIRECT in the last 72 hours. Thyroid Function Tests: No results for input(s): TSH, T4TOTAL, FREET4, T3FREE, THYROIDAB in the last 72 hours. Anemia Panel: No results for input(s): VITAMINB12, FOLATE, FERRITIN, TIBC, IRON, RETICCTPCT in the last 72 hours. Sepsis Labs: No results for input(s): PROCALCITON, LATICACIDVEN in the last 168 hours.  Recent Results (from the past 240 hour(s))  SARS CORONAVIRUS 2 (TAT 6-24  HRS) Nasopharyngeal Nasopharyngeal Swab     Status: None   Collection Time: 05/03/20  6:42 PM   Specimen: Nasopharyngeal Swab  Result Value Ref Range Status   SARS Coronavirus 2 NEGATIVE NEGATIVE Final    Comment: (NOTE) SARS-CoV-2 target nucleic acids are NOT DETECTED.  The SARS-CoV-2 RNA is generally detectable in upper and lower respiratory specimens during the acute phase of infection. Negative results do not preclude SARS-CoV-2 infection, do not rule out co-infections with other pathogens, and should not be used as the sole basis for treatment or other patient management decisions. Negative results must be combined with clinical observations, patient history, and epidemiological information. The expected result is Negative.  Fact Sheet for Patients: SugarRoll.be  Fact Sheet for Healthcare Providers: https://www.woods-mathews.com/  This test is not yet approved or cleared by the [place] FDA and  has been authorized for detection and/or diagnosis of SARS-CoV-2 by FDA under an Emergency Use Authorization (EUA). This EUA will remain  in effect (meaning this test can be used) for the duration of the COVID-19 declaration under Se ction 564(b)(1) of the Act, 21 U.S.C. section 360bbb-3(b)(1), unless the authorization is terminated or revoked sooner.  Performed at [facility], [place] [address] [place], [place] [address]      Radiology Studies: DG Chest 2 View  Result Date: 05/08/2020 CLINICAL DATA:  Fever EXAM: CHEST - 2 VIEW COMPARISON:  May 03, 2020 FINDINGS: Lungs are clear. Heart is enlarged with pulmonary vascularity normal, stable. No adenopathy. No bone lesions. IMPRESSION: Stable cardiac prominence.  No edema or airspace opacity. Electronically Signed   By: Lowella Grip III M.D.   On: 05/08/2020 11:37   ECHO TEE  Result Date: 05/08/2020    TRANSESOPHOGEAL ECHO REPORT   Patient Name:   Kim Orr Date of  Exam: 05/08/2020 Medical Rec #:  [id number]        Height:       71.0 in Accession #:    [id number]  Weight:       270.4 lb Date of Birth:  1940-03-17       BSA:          2.397 m Patient Age:    18 years         BP:           94/72 mmHg Patient Gender: F                HR:           145 bpm. Exam Location:  Inpatient Procedure: Transesophageal Echo, Cardiac Doppler and Color Doppler Indications:     Atrial flutter  History:         Patient has prior history of Echocardiogram examinations, most                  recent 05/04/2020. Arrythmias:Atrial Flutter; Risk                  Factors:Hypertension and Dyslipidemia.  Sonographer:     Clayton Lefort RDCS (AE) Referring Phys:  [id number] Diagnosing Phys: Sanda Klein MD PROCEDURE: After discussion of the risks and benefits of a TEE, an informed consent was obtained from the patient. The transesophogeal probe was passed without difficulty through the esophogus of the patient. Sedation performed by different physician. The patient was monitored while under deep sedation. Anesthestetic sedation was provided intravenously by Anesthesiology: 100mg  of Propofol, 60mg  of Lidocaine. Image quality was good. The patient developed no complications during the procedure. A successful direct current cardioversion was performed at 120 joules with 1 attempt. IMPRESSIONS  1. Left ventricular ejection fraction, by estimation, is 35 to 40%. The left ventricle has moderately decreased function.  2. Right ventricular systolic function is moderately reduced. The right ventricular size is mildly enlarged. There is normal pulmonary artery systolic pressure.  3. Left atrial size was severely dilated. No left atrial/left atrial appendage thrombus was detected. The LAA emptying velocity was 60 cm/s.  4. The pericardial effusion is circumferential.  5. The mitral valve is normal in structure. Mild to moderate mitral valve regurgitation.  6. Tricuspid valve regurgitation is moderate.  7.  The aortic valve is tricuspid. Aortic valve regurgitation is not visualized. Mild aortic valve sclerosis is present, with no evidence of aortic valve stenosis.  8. There is mild (Grade II) atheroma plaque. Comparison(s): Compared with 05/04/2020, the left ventricular systolic function has worsened (heart rate on today's study is 145 bpm, as opposed to 73 bpm on the 3/25 study). FINDINGS  Left Ventricle: Left ventricular ejection fraction, by estimation, is 35 to 40%. The left ventricle has moderately decreased function. The left ventricular internal cavity size was normal in size. There is no left ventricular hypertrophy. Right Ventricle: The right ventricular size is mildly enlarged. No increase in right ventricular wall thickness. Right ventricular systolic function is moderately reduced. There is normal pulmonary artery systolic pressure. The tricuspid regurgitant velocity is 1.92 m/s, and with an assumed right atrial pressure of 15 mmHg, the estimated right ventricular systolic pressure is 81.1 mmHg. Left Atrium: Left atrial size was severely dilated. No left atrial/left atrial appendage thrombus was detected. The LAA emptying velocity was 60 cm/s. Right Atrium: Right atrial size was normal in size. Pericardium: Trivial pericardial effusion is present. The pericardial effusion is circumferential. Mitral Valve: The mitral valve is normal in structure. Mild to moderate mitral valve regurgitation, with centrally-directed jet. Tricuspid Valve: The tricuspid valve is grossly normal. Tricuspid valve regurgitation is  moderate. Aortic Valve: The aortic valve is tricuspid. Aortic valve regurgitation is not visualized. Mild aortic valve sclerosis is present, with no evidence of aortic valve stenosis. Pulmonic Valve: The pulmonic valve was grossly normal. Pulmonic valve regurgitation is not visualized. Aorta: The aortic root, ascending aorta and aortic arch are all structurally normal, with no evidence of dilitation or  obstruction. There is mild (Grade II) atheroma plaque. IAS/Shunts: There is redundancy of the interatrial septum. No atrial level shunt detected by color flow Doppler. There is no evidence of an atrial septal defect.  TRICUSPID VALVE TR Peak grad:   14.7 mmHg TR Vmax:        192.00 cm/s Sanda Klein MD Electronically signed by Sanda Klein MD Signature Date/Time: 05/08/2020/3:17:15 PM    Final      LOS: 5 days   Antonieta Pert, MD [facility]  05/09/2020, 1:16 PM

## 2020-05-10 LAB — BASIC METABOLIC PANEL
Anion gap: 7 (ref 5–15)
BUN: 24 mg/dL — ABNORMAL HIGH (ref 8–23)
CO2: 28 mmol/L (ref 22–32)
Calcium: 8.6 mg/dL — ABNORMAL LOW (ref 8.9–10.3)
Chloride: 104 mmol/L (ref 98–111)
Creatinine, Ser: 0.79 mg/dL (ref 0.44–1.00)
GFR, Estimated: >60 mL/min (ref >60)
Glucose, Bld: 119 mg/dL — ABNORMAL HIGH (ref 70–99)
Potassium: 3.8 mmol/L (ref 3.5–5.1)
Sodium: 139 mmol/L (ref 135–145)

## 2020-05-10 LAB — CBC
HCT: 42 % (ref 36.0–46.0)
Hemoglobin: 13.6 g/dL (ref 12.0–15.0)
MCH: 33.3 pg (ref 26.0–34.0)
MCHC: 32.4 g/dL (ref 30.0–36.0)
MCV: 102.7 fL — ABNORMAL HIGH (ref 80.0–100.0)
Platelets: 177 10*3/uL (ref 150–400)
RBC: 4.09 MIL/uL (ref 3.87–5.11)
RDW: 13.5 % (ref 11.5–15.5)
WBC: 9 10*3/uL (ref 4.0–10.5)
nRBC: 0 % (ref 0.0–0.2)

## 2020-05-10 MED ORDER — LIDOCAINE 5 % EX PTCH
1.0000 | MEDICATED_PATCH | CUTANEOUS | Status: DC
  Administered 2020-05-10: 1 via TRANSDERMAL
  Filled 2020-05-10: qty 1

## 2020-05-10 MED ORDER — OXYCODONE HCL 5 MG PO TABS
5.0000 mg | ORAL_TABLET | Freq: Three times a day (TID) | ORAL | Status: DC | PRN
  Administered 2020-05-10: 5 mg via ORAL
  Filled 2020-05-10: qty 1

## 2020-05-10 NOTE — Progress Notes (Signed)
PROGRESS NOTE    Kim Orr  SUO:[id number] DOB: 1940-04-24 DOA: 05/03/2020 PCP: Lajean Manes, MD   Chief Complaint  Patient presents with  . Tachycardia  Brief Narrative: 80 year old female history of hypothyroidism on methimazole, chronic pain syndrome secondary to chronic low back pain sciatica, hypertension, lymphedema, hyperlipidemia sent to ED by primary care doctor for new onset tachyarrhythmia. Patient was admitted for a flutter, cardiology was consulted.  Patient was placed on Cardizem drip, anticoagulation is started with Eliquis Followed by cardiology very closely diuresed with IV Lasix Transition to oral diuretics.  Subjective: Seen and examined this morning.  Complains of chronic back issues Hopeful about returning to SNF soon  Assessment & Plan:  Atrial flutter with rapid ventricular rate, new onset:/p TEE DCCV 3/29 and maintaining normal sinus rhythm.  Continue anticoagulant with Eliquis, continue metoprolol.  Appreciate cardiology input.  Will need close outpatient follow-up.Echo with EF normal troponin negative x2  Acute combined CHF/lymphedema/peripheral edema chronic  with some worsening past weekdue to RVR.baseline wt 260lb in December, on admission 128kg/282 lb-nicely improved to 121 kg with significant diuresis.  Continue. Lasix 80 mg twice daily, continue to monitor intake output Daily weight and will need outpatient follow-up with the CHF clinic appointment scheduled for May .  Net IO Since Admission: -3,752.77 mL [05/10/20 1039]  Filed Weights   05/08/20 1105 05/09/20 0454 05/10/20 0533  Weight: 122.7 kg 126 kg 121.6 kg   Moderate MR/moderate TR outpatient follow-up  Hypokalemia: Stable, continue kcl 40 mew daily while on Lasix Recent Labs  Lab 05/06/20 0342 05/07/20 0207 05/08/20 0241 05/09/20 0407 05/10/20 0231  K 3.7 3.4* 3.9 4.1 3.8   Essential hypertension: Well-controlled on metoprolol.  Off lisinopril due to soft blood pressure    Hyperthyroidism: Continue methimazole TSH is stable.   Chronic low back pain/sciatica/chronic pain continue as needed oxycodone -due to ongoing chronic back pain increased to every 8 hours as needed, continue Tylenol, add lidocaine patch.Cont  muscle relaxant and her Neurontin.  Anxiety issues, continue supportive measure Ambien bedtime   Mixed hyperlipidemia: Continue on Zocor.  Morbid obesity BMI 38:Patient will benefit with PCP follow-up,weight loss.   Deconditioning: Cont PT/OT awAiting SNF  Diet Order            Diet Heart Room service appropriate? Yes; Fluid consistency: Thin  Diet effective now               Patient's Body mass index is 37.39 kg/m.  DVT prophylaxis: eliquis Code Status:   Code Status: Full Code  Family Communication: plan of care discussed with patient at bedside. She reports she has been updating her daughter who is a nurse-I had discussed with her previously, seh is also reviewing her in my chart  Status is: Inpatient Patient remains hospitalized and awaiting on SNF placement Dispo: The patient is from: Home              Anticipated d/c is to: SNF pending insurance and approval.              Patient currently is medically stable for discharge   Difficult to place patient No Unresulted Labs (From admission, onward)         None     Medications reviewed:  Scheduled Meds: . apixaban  5 mg Oral BID  . furosemide  80 mg Oral BID  . gabapentin  300 mg Oral q AM  . gabapentin  600 mg Oral QHS  . methimazole  5 mg  Oral Daily  . metoprolol tartrate  25 mg Oral Q6H  . potassium chloride  40 mEq Oral q AM  . senna  1 tablet Oral BID  . simvastatin  20 mg Oral q1800   Continuous Infusions:   Consultants:see note  Procedures:see note  Antimicrobials: Anti-infectives (From admission, onward)   None     Culture/Microbiology No results found for: SDES, SPECREQUEST, CULT, REPTSTATUS  Other culture-see note  Objective: Vitals: Today's  Vitals   05/10/20 0248 05/10/20 0533 05/10/20 0742 05/10/20 0847  BP:  117/62 132/74 132/74  Pulse:  70 67   Resp:  20 19 17   Temp:  97.7 F (36.5 C) 98.7 F (37.1 C) 98.6 F (37 C)  TempSrc:  Oral Oral Oral  SpO2:  93% 92% 90%  Weight:  121.6 kg    Height:      PainSc: Asleep  10-Worst pain ever 10-Worst pain ever    Intake/Output Summary (Last 24 hours) at 05/10/2020 1039 Last data filed at 05/10/2020 0742 Gross per 24 hour  Intake 60 ml  Output 1100 ml  Net -1040 ml   Filed Weights   05/08/20 1105 05/09/20 0454 05/10/20 0533  Weight: 122.7 kg 126 kg 121.6 kg   Weight change: -1.053 kg  Intake/Output from previous day: 03/30 0701 - 03/31 0700 In: -  Out: 1100 [Urine:1100] Intake/Output this shift: Total I/O In: 60 [P.O.:60] Out: -  Filed Weights   05/08/20 1105 05/09/20 0454 05/10/20 0533  Weight: 122.7 kg 126 kg 121.6 kg    Examination: General exam: AAOx3 , morbidly obese, NAD, weak appearing. HEENT:Oral mucosa moist, Ear/Nose WNL grossly, dentition normal. Respiratory system: bilaterally clear,no wheezing or crackles,no use of accessory muscle Cardiovascular system: S1 & S2 +, No JVD,. Gastrointestinal system: Abdomen soft, NT,ND, BS+ Nervous System:Alert, awake, moving extremities and grossly nonfocal Extremities: Bilateral lymphedema, distal peripheral pulses palpable.  Skin: No rashes,no icterus. MSK: Normal muscle bulk,tone, power  Data Reviewed: I have personally reviewed following labs and imaging studies CBC: Recent Labs  Lab 05/04/20 0043 05/05/20 0131 05/06/20 0342 05/07/20 0207 05/08/20 0241 05/09/20 0407 05/10/20 0231  WBC 8.5   < > 7.1 9.0 10.0 8.1 9.0  NEUTROABS 5.9  --   --   --   --   --   --   HGB 14.0   < > 14.3 15.5* 15.8* 14.3 13.6  HCT 42.3   < > 43.3 47.0* 46.7* 45.1 42.0  MCV 101.2*   < > 100.9* 101.3* 100.6* 104.4* 102.7*  PLT 148*   < > 156 173 192 204 177   < > = values in this interval not displayed.   Basic Metabolic  Panel: Recent Labs  Lab 05/03/20 1751 05/04/20 0043 05/04/20 1128 05/06/20 0342 05/07/20 0207 05/08/20 0241 05/09/20 0407 05/10/20 0231  NA 141 140   < > 141 140 141 140 139  K 4.2 3.1*   < > 3.7 3.4* 3.9 4.1 3.8  CL 104 103   < > 104 103 105 101 104  CO2 31 33*   < > 30 30 27 31 28   GLUCOSE 107* 124*   < > 104* 109* 114* 118* 119*  BUN 12 12   < > 14 13 18 23  24*  CREATININE 0.91 0.93   < > 0.88 0.80 0.97 1.05* 0.79  CALCIUM 9.4 8.6*   < > 8.8* 8.8* 9.2 8.9 8.6*  MG 2.2 2.0  --   --   --  2.1  --   --    < > =  values in this interval not displayed.   GFR: Estimated Creatinine Clearance: 80.7 mL/min (by C-G formula based on SCr of 0.79 mg/dL). Liver Function Tests: Recent Labs  Lab 05/03/20 1751 05/04/20 0043  AST 16 15  ALT 11 10  ALKPHOS 47 42  BILITOT 0.6 0.6  PROT 6.0* 5.6*  ALBUMIN 3.8 3.3*   No results for input(s): LIPASE, AMYLASE in the last 168 hours. No results for input(s): AMMONIA in the last 168 hours. Coagulation Profile: No results for input(s): INR, PROTIME in the last 168 hours. Cardiac Enzymes: No results for input(s): CKTOTAL, CKMB, CKMBINDEX, TROPONINI in the last 168 hours. BNP (last 3 results) No results for input(s): PROBNP in the last 8760 hours. HbA1C: No results for input(s): HGBA1C in the last 72 hours. CBG: Recent Labs  Lab 05/07/20 0750  GLUCAP 100*   Lipid Profile: No results for input(s): CHOL, HDL, LDLCALC, TRIG, CHOLHDL, LDLDIRECT in the last 72 hours. Thyroid Function Tests: No results for input(s): TSH, T4TOTAL, FREET4, T3FREE, THYROIDAB in the last 72 hours. Anemia Panel: No results for input(s): VITAMINB12, FOLATE, FERRITIN, TIBC, IRON, RETICCTPCT in the last 72 hours. Sepsis Labs: No results for input(s): PROCALCITON, LATICACIDVEN in the last 168 hours.  Recent Results (from the past 240 hour(s))  SARS CORONAVIRUS 2 (TAT 6-24 HRS) Nasopharyngeal Nasopharyngeal Swab     Status: None   Collection Time: 05/03/20  6:42  PM   Specimen: Nasopharyngeal Swab  Result Value Ref Range Status   SARS Coronavirus 2 NEGATIVE NEGATIVE Final    Comment: (NOTE) SARS-CoV-2 target nucleic acids are NOT DETECTED.  The SARS-CoV-2 RNA is generally detectable in upper and lower respiratory specimens during the acute phase of infection. Negative results do not preclude SARS-CoV-2 infection, do not rule out co-infections with other pathogens, and should not be used as the sole basis for treatment or other patient management decisions. Negative results must be combined with clinical observations, patient history, and epidemiological information. The expected result is Negative.  Fact Sheet for Patients: SugarRoll.be  Fact Sheet for Healthcare Providers: https://www.woods-mathews.com/  This test is not yet approved or cleared by the [place] FDA and  has been authorized for detection and/or diagnosis of SARS-CoV-2 by FDA under an Emergency Use Authorization (EUA). This EUA will remain  in effect (meaning this test can be used) for the duration of the COVID-19 declaration under Se ction 564(b)(1) of the Act, 21 U.S.C. section 360bbb-3(b)(1), unless the authorization is terminated or revoked sooner.  Performed at [facility], [place] [address], [place], [place] [address]      Radiology Studies: ECHO TEE  Result Date: 05/08/2020    TRANSESOPHOGEAL ECHO REPORT   Patient Name:   Kim Orr Date of Exam: 05/08/2020 Medical Rec #:  [id number]        Height:       71.0 in Accession #:    [id number]       Weight:       270.4 lb Date of Birth:  04/24/1940       BSA:          2.397 m Patient Age:    41 years         BP:           94/72 mmHg Patient Gender: F                HR:           145 bpm. Exam Location:  Inpatient Procedure: Transesophageal Echo, Cardiac Doppler and Color Doppler Indications:     Atrial flutter  History:         Patient has prior history of  Echocardiogram examinations, most                  recent 05/04/2020. Arrythmias:Atrial Flutter; Risk                  Factors:Hypertension and Dyslipidemia.  Sonographer:     Clayton Lefort RDCS (AE) Referring Phys:  [id number] [place] Diagnosing Phys: Sanda Klein MD PROCEDURE: After discussion of the risks and benefits of a TEE, an informed consent was obtained from the patient. The transesophogeal probe was passed without difficulty through the esophogus of the patient. Sedation performed by different physician. The patient was monitored while under deep sedation. Anesthestetic sedation was provided intravenously by Anesthesiology: 100mg  of Propofol, 60mg  of Lidocaine. Image quality was good. The patient developed no complications during the procedure. A successful direct current cardioversion was performed at 120 joules with 1 attempt. IMPRESSIONS  1. Left ventricular ejection fraction, by estimation, is 35 to 40%. The left ventricle has moderately decreased function.  2. Right ventricular systolic function is moderately reduced. The right ventricular size is mildly enlarged. There is normal pulmonary artery systolic pressure.  3. Left atrial size was severely dilated. No left atrial/left atrial appendage thrombus was detected. The LAA emptying velocity was 60 cm/s.  4. The pericardial effusion is circumferential.  5. The mitral valve is normal in structure. Mild to moderate mitral valve regurgitation.  6. Tricuspid valve regurgitation is moderate.  7. The aortic valve is tricuspid. Aortic valve regurgitation is not visualized. Mild aortic valve sclerosis is present, with no evidence of aortic valve stenosis.  8. There is mild (Grade II) atheroma plaque. Comparison(s): Compared with 05/04/2020, the left ventricular systolic function has worsened (heart rate on today's study is 145 bpm, as opposed to 73 bpm on the 3/25 study). FINDINGS  Left Ventricle: Left ventricular ejection fraction, by estimation, is 35 to 40%.  The left ventricle has moderately decreased function. The left ventricular internal cavity size was normal in size. There is no left ventricular hypertrophy. Right Ventricle: The right ventricular size is mildly enlarged. No increase in right ventricular wall thickness. Right ventricular systolic function is moderately reduced. There is normal pulmonary artery systolic pressure. The tricuspid regurgitant velocity is 1.92 m/s, and with an assumed right atrial pressure of 15 mmHg, the estimated right ventricular systolic pressure is 34.2 mmHg. Left Atrium: Left atrial size was severely dilated. No left atrial/left atrial appendage thrombus was detected. The LAA emptying velocity was 60 cm/s. Right Atrium: Right atrial size was normal in size. Pericardium: Trivial pericardial effusion is present. The pericardial effusion is circumferential. Mitral Valve: The mitral valve is normal in structure. Mild to moderate mitral valve regurgitation, with centrally-directed jet. Tricuspid Valve: The tricuspid valve is grossly normal. Tricuspid valve regurgitation is moderate. Aortic Valve: The aortic valve is tricuspid. Aortic valve regurgitation is not visualized. Mild aortic valve sclerosis is present, with no evidence of aortic valve stenosis. Pulmonic Valve: The pulmonic valve was grossly normal. Pulmonic valve regurgitation is not visualized. Aorta: The aortic root, ascending aorta and aortic arch are all structurally normal, with no evidence of dilitation or obstruction. There is mild (Grade II) atheroma plaque. IAS/Shunts: There is redundancy of the interatrial septum. No atrial level shunt detected by color flow Doppler. There is no evidence of an atrial septal  defect.  TRICUSPID VALVE TR Peak grad:   14.7 mmHg TR Vmax:        192.00 cm/s Sanda Klein MD Electronically signed by Sanda Klein MD Signature Date/Time: 05/08/2020/3:17:15 PM    Final      LOS: 6 days   Antonieta Pert, MD [facility]  05/10/2020,  10:39 AM

## 2020-05-10 NOTE — Discharge Summary (Incomplete)
Physician Discharge Summary  LANETRA HARTLEY ZYS:[id number] DOB: [birth year] DOA: 05/03/2020  PCP: Lajean Manes, MD  Admit date: 05/03/2020 Discharge date: 05/10/2020  Admitted From: *** Disposition:  ***  Recommendations for Outpatient Follow-up:  1. Follow up with PCP in 1-2 weeks 2. Please obtain BMP/CBC in one week 3. Please follow up on the following pending results:  Home Health:***  Equipment/Devices: ***  Discharge Condition: Stable Code Status:   Code Status: Full Code Diet recommendation:  Diet Order            Diet Heart Room service appropriate? Yes; Fluid consistency: Thin  Diet effective now                  Brief/Interim Summary: 80 year old female history of hypothyroidism on methimazole, chronic pain syndrome secondary to chronic low back pain sciatica, hypertension, lymphedema, hyperlipidemia sent to ED by primary care doctor for new onset tachyarrhythmia. Patient was admitted for a flutter, cardiology was consulted.  Patient was placed on Cardizem drip, anticoagulation is started with Eliquis Followed by cardiology very closely diuresed with IV Lasix. Status post TEE DCV 3/29 and maintaining sinus rhythm since then.  Remains anticoagulated on Eliquis and on oral metoprolol. Remains weak and deconditioned.  Advised to go to skilled nursing facility-and she is waiting on placement  Discharge Diagnoses:  Atrial flutter with rapid ventricular rate, new onset: S/p TEE DCCV 3/29 and maintaining normal sinus rhythm.  Continue anticoagulant with Eliquis, continue metoprolol.  Appreciate cardiology input.  Will need close outpatient follow-up.Echo with EF normal troponin negative x2..  Acute combined CHF/lymphedema/peripheral edema chronic with some worsening past week due to RVR.baseline wt 260lb in December, on admission 128kg/282 lb-nicely improved to 121 kg with significant diuresis.  Continue. Lasix 80 mg twice daily, continue to monitor intake output Daily  weight and will need outpatient follow-up with the CHF clinic appointment scheduled for May 9.  Net IO Since Admission: -3,812.77 mL [05/10/20 0817]  Filed Weights   05/08/20 1105 05/09/20 0454 05/10/20 0533  Weight: 122.7 kg 126 kg 121.6 kg   Moderate MR/moderate TR outpatient follow-up  Hypokalemia: Improved continue on PO KCL 40 M EQ daily while on Lasix  Essential hypertension: Well-controlled on metoprolol.  Off home lisinopril due to soft blood pressure   Hyperthyroidism:on methimazole with a stable TSH.    Chronic low back pain/sciatica/chronic pain continue as needed oxycodone - at q12h prn- dose not use much pain meds at home per daughter.Cont her  muscle relaxant and her Neurontin.  Anxiety issues, continue supportive measure Ambien bedtime   Mixed hyperlipidemia: Continue on Zocor.  Morbid obesity BMI 38:Patient will benefit with PCP follow-up,weight loss.   Deconditioning: Cont PT/OT awAiting SNF  Consults:  CARDIOLOGY  Subjective: *** Discharge Exam: Vitals:   05/10/20 0533 05/10/20 0742  BP: 117/62 132/74  Pulse: 70 67  Resp: 20 19  Temp: 97.7 F (36.5 C) 98.7 F (37.1 C)  SpO2: 93% 92%   General: Pt is alert, awake, not in acute distress Cardiovascular: RRR, S1/S2 +, no rubs, no gallops Respiratory: CTA bilaterally, no wheezing, no rhonchi Abdominal: Soft, NT, ND, bowel sounds + Extremities: no edema, no cyanosis  Discharge Instructions   Allergies as of 05/10/2020   No Known Allergies   Med Rec must be completed prior to using this [place]***       Follow-up Information    [facility] Follow up.   Specialty: Cardiology Why: [facility] -  a follow-up has been made for you on Friday May 18, 2020 at 9:00 AM. Please arrive 15 minutes early to check in. If you call phone number listed, you will hear directions and parking garage code. Contact information: [address] [id number] mc [place] [phone]       Buford Dresser, MD Follow up.   Specialty: Cardiology Why: [facility] (Cardiology) - [facility] location - a follow-up has been arranged with Dr. Harrell Gave on Monday Jun 18, 2020 1:20 PM (Arrive by 1:05 PM). Contact information: [address] [place] [address] [phone]              No Known Allergies  The results of significant diagnostics from this hospitalization (including imaging, microbiology, ancillary and laboratory) are listed below for reference.    Microbiology: Recent Results (from the past 240 hour(s))  SARS CORONAVIRUS 2 (TAT 6-24 HRS) Nasopharyngeal Nasopharyngeal Swab     Status: None   Collection Time: 05/03/20  6:42 PM   Specimen: Nasopharyngeal Swab  Result Value Ref Range Status   SARS Coronavirus 2 NEGATIVE NEGATIVE Final    Comment: (NOTE) SARS-CoV-2 target nucleic acids are NOT DETECTED.  The SARS-CoV-2 RNA is generally detectable in upper and lower respiratory specimens during the acute phase of infection. Negative results do not preclude SARS-CoV-2 infection, do not rule out co-infections with other pathogens, and should not be used as the sole basis for treatment or other patient management decisions. Negative results must be combined with clinical observations, patient history, and epidemiological information. The expected result is Negative.  Fact Sheet for Patients: SugarRoll.be  Fact Sheet for Healthcare Providers: https://www.woods-mathews.com/  This test is not yet approved or cleared by the [place] FDA and  has been authorized for detection and/or diagnosis of SARS-CoV-2 by FDA under an Emergency Use Authorization (EUA). This EUA will remain  in effect (meaning this test can be used) for the duration of the COVID-19 declaration under Se ction 564(b)(1) of the Act, 21 U.S.C. section  360bbb-3(b)(1), unless the authorization is terminated or revoked sooner.  Performed at [facility], [place] [address] [place], [place] [address]     Procedures/Studies: DG Chest 2 View  Result Date: 05/08/2020 CLINICAL DATA:  Fever EXAM: CHEST - 2 VIEW COMPARISON:  May 03, 2020 FINDINGS: Lungs are clear. Heart is enlarged with pulmonary vascularity normal, stable. No adenopathy. No bone lesions. IMPRESSION: Stable cardiac prominence.  No edema or airspace opacity. Electronically Signed   By: Lowella Grip III M.D.   On: 05/08/2020 11:37   DG Chest Portable 1 View  Result Date: 05/03/2020 CLINICAL DATA:  Chest pain EXAM: PORTABLE CHEST 1 VIEW COMPARISON:  None. FINDINGS: Cardiomegaly with vascular congestion. Increased markings in the lung bases could reflect atelectasis or early edema. No effusions. No acute bony abnormality. IMPRESSION: Cardiomegaly, vascular congestion. Increased markings in the bases could reflect atelectasis or early edema. Electronically Signed   By: Rolm Baptise M.D.   On: 05/03/2020 18:55   ECHOCARDIOGRAM COMPLETE  Result Date: 05/04/2020    ECHOCARDIOGRAM REPORT   Patient Name:   Kim Orr Date of Exam: 05/04/2020 Medical Rec #:  [id number]        Height:       71.0 in Accession #:    [id number]       Weight:       282.6 lb Date of Birth:  1941-01-16       BSA:  2.442 m Patient Age:    55 years         BP:           103/79 mmHg Patient Gender: F                HR:           73 bpm. Exam Location:  Inpatient Procedure: 2D Echo, Cardiac Doppler, Color Doppler and Intracardiac            Opacification Agent Indications:    I48.91* Unspeicified atrial fibrillation  History:        Patient has no prior history of Echocardiogram examinations.  Sonographer:    Merrie Roof RDCS Referring Phys: [id number] [place]  1. Left ventricular ejection fraction, by estimation, is 55 to 60%. The left ventricle has normal function. The left ventricle  has no regional wall motion abnormalities. Left ventricular diastolic function could not be evaluated.  2. Right ventricular systolic function is mildly reduced. The right ventricular size is moderately enlarged. There is normal pulmonary artery systolic pressure.  3. The mitral valve is normal in structure. Moderate mitral valve regurgitation. No evidence of mitral stenosis.  4. Tricuspid valve regurgitation is moderate.  5. The aortic valve is tricuspid. Aortic valve regurgitation is not visualized. Mild aortic valve sclerosis is present, with no evidence of aortic valve stenosis.  6. The inferior vena cava is dilated in size with <50% respiratory variability, suggesting right atrial pressure of 15 mmHg. FINDINGS  Left Ventricle: Left ventricular ejection fraction, by estimation, is 55 to 60%. The left ventricle has normal function. The left ventricle has no regional wall motion abnormalities. Definity contrast agent was given IV to delineate the left ventricular  endocardial borders. The left ventricular internal cavity size was normal in size. There is borderline concentric left ventricular hypertrophy. Left ventricular diastolic function could not be evaluated due to atrial fibrillation. Left ventricular diastolic function could not be evaluated. Right Ventricle: The right ventricular size is moderately enlarged. No increase in right ventricular wall thickness. Right ventricular systolic function is mildly reduced. There is normal pulmonary artery systolic pressure. The tricuspid regurgitant velocity is 2.26 m/s, and with an assumed right atrial pressure of 15 mmHg, the estimated right ventricular systolic pressure is 87.5 mmHg. Left Atrium: Left atrial size was normal in size. Right Atrium: Right atrial size was normal in size. Pericardium: There is no evidence of pericardial effusion. Mitral Valve: The mitral valve is normal in structure. Moderate mitral valve regurgitation. No evidence of mitral valve  stenosis. Tricuspid Valve: The tricuspid valve is normal in structure. Tricuspid valve regurgitation is moderate . No evidence of tricuspid stenosis. Aortic Valve: The aortic valve is tricuspid. Aortic valve regurgitation is not visualized. Mild aortic valve sclerosis is present, with no evidence of aortic valve stenosis. Aortic valve mean gradient measures 5.0 mmHg. Aortic valve peak gradient measures 7.5 mmHg. Aortic valve area, by VTI measures 2.42 cm. Pulmonic Valve: The pulmonic valve was normal in structure. Pulmonic valve regurgitation is not visualized. No evidence of pulmonic stenosis. Aorta: The aortic root is normal in size and structure. Venous: The inferior vena cava is dilated in size with less than 50% respiratory variability, suggesting right atrial pressure of 15 mmHg. IAS/Shunts: No atrial level shunt detected by color flow Doppler.  LEFT VENTRICLE PLAX 2D LVIDd:         4.40 cm LVIDs:         3.40 cm LV PW:  1.20 cm LV IVS:        1.20 cm LVOT diam:     2.20 cm LV SV:         56 LV SV Index:   23 LVOT Area:     3.80 cm  RIGHT VENTRICLE             IVC RV Basal diam:  5.00 cm     IVC diam: 2.30 cm RV Mid diam:    4.20 cm RV S prime:     10.00 cm/s TAPSE (M-mode): 1.7 cm LEFT ATRIUM           Index       RIGHT ATRIUM           Index LA diam:      2.80 cm 1.15 cm/m  RA Area:     29.10 cm LA Vol (A4C): 65.5 ml 26.82 ml/m RA Volume:   101.00 ml 41.35 ml/m  AORTIC VALVE AV Area (Vmax):    2.59 cm AV Area (Vmean):   2.28 cm AV Area (VTI):     2.42 cm AV Vmax:           137.00 cm/s AV Vmean:          104.000 cm/s AV VTI:            0.231 m AV Peak Grad:      7.5 mmHg AV Mean Grad:      5.0 mmHg LVOT Vmax:         93.50 cm/s LVOT Vmean:        62.400 cm/s LVOT VTI:          0.147 m LVOT/AV VTI ratio: 0.64  AORTA Ao Root diam: 2.30 cm Ao Asc diam:  3.20 cm TRICUSPID VALVE TR Peak grad:   20.4 mmHg TR Vmax:        226.00 cm/s  SHUNTS Systemic VTI:  0.15 m Systemic Diam: 2.20 cm Dani Gobble Croitoru  MD Electronically signed by Sanda Klein MD Signature Date/Time: 05/04/2020/12:15:32 PM    Final    ECHO TEE  Result Date: 05/08/2020    TRANSESOPHOGEAL ECHO REPORT   Patient Name:   Kim Orr Date of Exam: 05/08/2020 Medical Rec #:  [id number]        Height:       71.0 in Accession #:    [id number]       Weight:       270.4 lb Date of Birth:  Aug 11, 1940       BSA:          2.397 m Patient Age:    80 years         BP:           94/72 mmHg Patient Gender: F                HR:           145 bpm. Exam Location:  Inpatient Procedure: Transesophageal Echo, Cardiac Doppler and Color Doppler Indications:     Atrial flutter  History:         Patient has prior history of Echocardiogram examinations, most                  recent 05/04/2020. Arrythmias:Atrial Flutter; Risk                  Factors:Hypertension and Dyslipidemia.  Sonographer:     Clayton Lefort RDCS (AE) Referring Phys:  [id number]  DAYNA N DUNN Diagnosing Phys: Sanda Klein MD PROCEDURE: After discussion of the risks and benefits of a TEE, an informed consent was obtained from the patient. The transesophogeal probe was passed without difficulty through the esophogus of the patient. Sedation performed by different physician. The patient was monitored while under deep sedation. Anesthestetic sedation was provided intravenously by Anesthesiology: 100mg  of Propofol, 60mg  of Lidocaine. Image quality was good. The patient developed no complications during the procedure. A successful direct current cardioversion was performed at 120 joules with 1 attempt. IMPRESSIONS  1. Left ventricular ejection fraction, by estimation, is 35 to 40%. The left ventricle has moderately decreased function.  2. Right ventricular systolic function is moderately reduced. The right ventricular size is mildly enlarged. There is normal pulmonary artery systolic pressure.  3. Left atrial size was severely dilated. No left atrial/left atrial appendage thrombus was detected. The LAA emptying  velocity was 60 cm/s.  4. The pericardial effusion is circumferential.  5. The mitral valve is normal in structure. Mild to moderate mitral valve regurgitation.  6. Tricuspid valve regurgitation is moderate.  7. The aortic valve is tricuspid. Aortic valve regurgitation is not visualized. Mild aortic valve sclerosis is present, with no evidence of aortic valve stenosis.  8. There is mild (Grade II) atheroma plaque. Comparison(s): Compared with 05/04/2020, the left ventricular systolic function has worsened (heart rate on today's study is 145 bpm, as opposed to 73 bpm on the 3/25 study). FINDINGS  Left Ventricle: Left ventricular ejection fraction, by estimation, is 35 to 40%. The left ventricle has moderately decreased function. The left ventricular internal cavity size was normal in size. There is no left ventricular hypertrophy. Right Ventricle: The right ventricular size is mildly enlarged. No increase in right ventricular wall thickness. Right ventricular systolic function is moderately reduced. There is normal pulmonary artery systolic pressure. The tricuspid regurgitant velocity is 1.92 m/s, and with an assumed right atrial pressure of 15 mmHg, the estimated right ventricular systolic pressure is 11.9 mmHg. Left Atrium: Left atrial size was severely dilated. No left atrial/left atrial appendage thrombus was detected. The LAA emptying velocity was 60 cm/s. Right Atrium: Right atrial size was normal in size. Pericardium: Trivial pericardial effusion is present. The pericardial effusion is circumferential. Mitral Valve: The mitral valve is normal in structure. Mild to moderate mitral valve regurgitation, with centrally-directed jet. Tricuspid Valve: The tricuspid valve is grossly normal. Tricuspid valve regurgitation is moderate. Aortic Valve: The aortic valve is tricuspid. Aortic valve regurgitation is not visualized. Mild aortic valve sclerosis is present, with no evidence of aortic valve stenosis. Pulmonic  Valve: The pulmonic valve was grossly normal. Pulmonic valve regurgitation is not visualized. Aorta: The aortic root, ascending aorta and aortic arch are all structurally normal, with no evidence of dilitation or obstruction. There is mild (Grade II) atheroma plaque. IAS/Shunts: There is redundancy of the interatrial septum. No atrial level shunt detected by color flow Doppler. There is no evidence of an atrial septal defect.  TRICUSPID VALVE TR Peak grad:   14.7 mmHg TR Vmax:        192.00 cm/s Sanda Klein MD Electronically signed by Sanda Klein MD Signature Date/Time: 05/08/2020/3:17:15 PM    Final      Labs: BNP (last 3 results) Recent Labs    05/03/20 1751  BNP 147.8*   Basic Metabolic Panel: Recent Labs  Lab 05/03/20 1751 05/04/20 0043 05/04/20 1128 05/06/20 0342 05/07/20 0207 05/08/20 0241 05/09/20 0407 05/10/20 0231  NA 141 140   < >  141 140 141 140 139  K 4.2 3.1*   < > 3.7 3.4* 3.9 4.1 3.8  CL 104 103   < > 104 103 105 101 104  CO2 31 33*   < > 30 30 27 31 28   GLUCOSE 107* 124*   < > 104* 109* 114* 118* 119*  BUN 12 12   < > 14 13 18 23  24*  CREATININE 0.91 0.93   < > 0.88 0.80 0.97 1.05* 0.79  CALCIUM 9.4 8.6*   < > 8.8* 8.8* 9.2 8.9 8.6*  MG 2.2 2.0  --   --   --  2.1  --   --    < > = values in this interval not displayed.   Liver Function Tests: Recent Labs  Lab 05/03/20 1751 05/04/20 0043  AST 16 15  ALT 11 10  ALKPHOS 47 42  BILITOT 0.6 0.6  PROT 6.0* 5.6*  ALBUMIN 3.8 3.3*   No results for input(s): LIPASE, AMYLASE in the last 168 hours. No results for input(s): AMMONIA in the last 168 hours. CBC: Recent Labs  Lab 05/04/20 0043 05/05/20 0131 05/06/20 0342 05/07/20 0207 05/08/20 0241 05/09/20 0407 05/10/20 0231  WBC 8.5   < > 7.1 9.0 10.0 8.1 9.0  NEUTROABS 5.9  --   --   --   --   --   --   HGB 14.0   < > 14.3 15.5* 15.8* 14.3 13.6  HCT 42.3   < > 43.3 47.0* 46.7* 45.1 42.0  MCV 101.2*   < > 100.9* 101.3* 100.6* 104.4* 102.7*  PLT  148*   < > 156 173 192 204 177   < > = values in this interval not displayed.   Cardiac Enzymes: No results for input(s): CKTOTAL, CKMB, CKMBINDEX, TROPONINI in the last 168 hours. BNP: Invalid input(s): POCBNP CBG: Recent Labs  Lab 05/07/20 0750  GLUCAP 100*   D-Dimer No results for input(s): DDIMER in the last 72 hours. Hgb A1c No results for input(s): HGBA1C in the last 72 hours. Lipid Profile No results for input(s): CHOL, HDL, LDLCALC, TRIG, CHOLHDL, LDLDIRECT in the last 72 hours. Thyroid function studies No results for input(s): TSH, T4TOTAL, T3FREE, THYROIDAB in the last 72 hours.  Invalid input(s): FREET3 Anemia work up No results for input(s): VITAMINB12, FOLATE, FERRITIN, TIBC, IRON, RETICCTPCT in the last 72 hours. Urinalysis No results found for: COLORURINE, APPEARANCEUR, [place], [place], GLUCOSEU, [place], [place], [place], PROTEINUR, UROBILINOGEN, NITRITE, LEUKOCYTESUR Sepsis Labs Invalid input(s): PROCALCITONIN,  WBC,  LACTICIDVEN Microbiology Recent Results (from the past 240 hour(s))  SARS CORONAVIRUS 2 (TAT 6-24 HRS) Nasopharyngeal Nasopharyngeal Swab     Status: None   Collection Time: 05/03/20  6:42 PM   Specimen: Nasopharyngeal Swab  Result Value Ref Range Status   SARS Coronavirus 2 NEGATIVE NEGATIVE Final    Comment: (NOTE) SARS-CoV-2 target nucleic acids are NOT DETECTED.  The SARS-CoV-2 RNA is generally detectable in upper and lower respiratory specimens during the acute phase of infection. Negative results do not preclude SARS-CoV-2 infection, do not rule out co-infections with other pathogens, and should not be used as the sole basis for treatment or other patient management decisions. Negative results must be combined with clinical observations, patient history, and epidemiological information. The expected result is Negative.  Fact Sheet for Patients: SugarRoll.be  Fact Sheet for Healthcare  Providers: https://www.woods-mathews.com/  This test is not yet approved or cleared by the [place] and  has been authorized  for detection and/or diagnosis of SARS-CoV-2 by FDA under an Emergency Use Authorization (EUA). This EUA will remain  in effect (meaning this test can be used) for the duration of the COVID-19 declaration under Se ction 564(b)(1) of the Act, 21 U.S.C. section 360bbb-3(b)(1), unless the authorization is terminated or revoked sooner.  Performed at [facility], [place] [address], [place], [place] [address]      Time coordinating discharge: *** minutes  SIGNED: Antonieta Pert, MD  [facility] 05/10/2020, 8:15 AM  If 7PM-7AM, please contact night-coverage [email or web address]

## 2020-05-10 NOTE — TOC Progression Note (Addendum)
Transition of Care [facility]) - Progression Note    Patient Details  Name: Kim Orr MRN: [id number] Date of Birth: 1940/10/17  [facility] CM/SW [place], [place] Phone Number: 05/10/2020, 11:44 AM  Clinical Narrative:    Update 3/31 12:22pm-CSW informed MD that patients insurance authorization went to peer to peer. Tele# [phone] option 3. Deadline is 4:30pm. Reference number is #[id number].CSW will continue to follow.   CSW spoke with Claiborne Billings with [employer] who requested current PT note for patients insurance authorization. CSW let PT know. PT has seen patient. PT note is currently in. CSW called Claiborne Billings with [employer] to let her know that PT note is in. Patients insurance authorization is pending. Patient has SN[facility] bed [facility]. CSW will continue to follow and assist with discharge planning needs.    Expected Discharge Plan: [place] Barriers to Discharge: Continued Medical Work up  Expected Discharge Plan and Services Expected Discharge Plan: [place] In-house Referral: Clinical Social Work     Living arrangements for the past 2 months: [place] Nurse, adult)                                       Social Determinants of Health (SDOH) Interventions    Readmission Risk Interventions No flowsheet data found.

## 2020-05-10 NOTE — Progress Notes (Signed)
Physical Therapy Treatment Patient Details Name: Kim Orr MRN: [id number] DOB: 09/17/1940 Today's Date: 05/10/2020    History of Present Illness 80 y.o. female admitted from PCP's office on 05/03/20 with atrial flutter with RVR, new onset; also some worsening edema. CXR with pulmonary edema.  Plan for TEE-CV on 3/29. PMH includes chronic pain syndrome, sciatica, HTN, lymphedema, morbid obesity.    PT Comments    Pt remains limited by LE and low back pain, tearful with mobility and anxious throughout session. Pt reports significant radiating pain with minimal movement this session. Pt continues to require significant physical assistance for all functional mobility due to weakness. Pt will benefit from continued acute PT POC to improve mobility quality and to reduce falls risk. PT continues to recommend SNF placement.   Follow Up Recommendations  SNF;Supervision for mobility/OOB     Equipment Recommendations  Wheelchair (measurements PT) (mechanical lift)    Recommendations for Other Services       Precautions / Restrictions Precautions Precautions: Fall;Other (comment) Precaution Comments: Bladder incontinence; significant pain and anxiety Restrictions Weight Bearing Restrictions: No    Mobility  Bed Mobility Overal bed mobility: Needs Assistance Bed Mobility: Rolling;Sidelying to Sit;Sit to Supine Rolling: Max assist Sidelying to sit: Max assist   Sit to supine: Max assist;+2 for physical assistance   General bed mobility comments: use of rails and PT cues for technique    Transfers Overall transfer level: Needs assistance Equipment used: 2 person hand held assist Transfers: Sit to/from Stand Sit to Stand: Max assist;+2 physical assistance         General transfer comment: elevated surface, bilateral knee block  Ambulation/Gait                 Stairs             Wheelchair Mobility    Modified Rankin (Stroke Patients Only)        Balance Overall balance assessment: Needs assistance Sitting-balance support: Bilateral upper extremity supported;Feet supported;Single extremity supported Sitting balance-Leahy Scale: Poor     Standing balance support: Bilateral upper extremity supported Standing balance-Leahy Scale: Poor Standing balance comment: posterior lean, mod-maxA x2 in standing                            Cognition Arousal/Alertness: Awake/alert Behavior During Therapy: Anxious Overall Cognitive Status: Impaired/Different from baseline Area of Impairment: Attention                   Current Attention Level: Selective                  Exercises      General Comments General comments (skin integrity, edema, etc.): HR stable in 60-70s this session, pt limited by significant pain      Pertinent Vitals/Pain Pain Assessment: Faces Faces Pain Scale: Hurts whole lot Pain Location: back and LEs Pain Descriptors / Indicators: Stabbing Pain Intervention(s): Premedicated before session    Home Living                      Prior Function            PT Goals (current goals can now be found in the care plan section) Acute Rehab PT Goals Patient Stated Goal: reported they have a place to go to rehab post hospital stay Progress towards PT goals: Not progressing toward goals - comment (limited by pain)    Frequency  Min 2X/week      PT Plan Current plan remains appropriate    Co-evaluation              AM-PAC PT "6 Clicks" Mobility   Outcome Measure  Help needed turning from your back to your side while in a flat bed without using bedrails?: A Lot Help needed moving from lying on your back to sitting on the side of a flat bed without using bedrails?: A Lot Help needed moving to and from a bed to a chair (including a wheelchair)?: Total Help needed standing up from a chair using your arms (e.g., wheelchair or bedside chair)?: A Lot Help needed to walk in  hospital room?: Total Help needed climbing 3-5 steps with a railing? : Total 6 Click Score: 9    End of Session Equipment Utilized During Treatment: Gait belt Activity Tolerance: Patient limited by pain Patient left: in bed;with call bell/phone within reach;with bed alarm set Nurse Communication: Mobility status;Need for lift equipment PT Visit Diagnosis: Other abnormalities of gait and mobility (R26.89);Muscle weakness (generalized) (M62.81);Pain     Time: 1026-1106 PT Time Calculation (min) (ACUTE ONLY): 40 min  Charges:  $Therapeutic Activity: 38-52 mins                     Zenaida Niece, PT, DPT Acute Rehabilitation Pager: [phone]    Zenaida Niece 05/10/2020, 11:39 AM

## 2020-05-11 DIAGNOSIS — R29898 Other symptoms and signs involving the musculoskeletal system: Secondary | ICD-10-CM | POA: Diagnosis not present

## 2020-05-11 DIAGNOSIS — Z7401 Bed confinement status: Secondary | ICD-10-CM | POA: Diagnosis not present

## 2020-05-11 DIAGNOSIS — E0590 Thyrotoxicosis, unspecified without thyrotoxic crisis or storm: Secondary | ICD-10-CM | POA: Diagnosis not present

## 2020-05-11 DIAGNOSIS — M159 Polyosteoarthritis, unspecified: Secondary | ICD-10-CM | POA: Diagnosis not present

## 2020-05-11 DIAGNOSIS — I4891 Unspecified atrial fibrillation: Secondary | ICD-10-CM | POA: Diagnosis not present

## 2020-05-11 DIAGNOSIS — M2550 Pain in unspecified joint: Secondary | ICD-10-CM | POA: Diagnosis not present

## 2020-05-11 DIAGNOSIS — I5043 Acute on chronic combined systolic (congestive) and diastolic (congestive) heart failure: Secondary | ICD-10-CM | POA: Diagnosis not present

## 2020-05-11 DIAGNOSIS — R0902 Hypoxemia: Secondary | ICD-10-CM | POA: Diagnosis not present

## 2020-05-11 DIAGNOSIS — I509 Heart failure, unspecified: Secondary | ICD-10-CM | POA: Diagnosis not present

## 2020-05-11 DIAGNOSIS — M5441 Lumbago with sciatica, right side: Secondary | ICD-10-CM | POA: Diagnosis not present

## 2020-05-11 DIAGNOSIS — I890 Lymphedema, not elsewhere classified: Secondary | ICD-10-CM | POA: Diagnosis not present

## 2020-05-11 DIAGNOSIS — R110 Nausea: Secondary | ICD-10-CM | POA: Diagnosis not present

## 2020-05-11 DIAGNOSIS — I5042 Chronic combined systolic (congestive) and diastolic (congestive) heart failure: Secondary | ICD-10-CM | POA: Diagnosis not present

## 2020-05-11 DIAGNOSIS — M5432 Sciatica, left side: Secondary | ICD-10-CM | POA: Diagnosis not present

## 2020-05-11 DIAGNOSIS — E785 Hyperlipidemia, unspecified: Secondary | ICD-10-CM | POA: Diagnosis not present

## 2020-05-11 DIAGNOSIS — I10 Essential (primary) hypertension: Secondary | ICD-10-CM | POA: Diagnosis not present

## 2020-05-11 DIAGNOSIS — R197 Diarrhea, unspecified: Secondary | ICD-10-CM | POA: Diagnosis not present

## 2020-05-11 DIAGNOSIS — G629 Polyneuropathy, unspecified: Secondary | ICD-10-CM | POA: Diagnosis not present

## 2020-05-11 DIAGNOSIS — I4892 Unspecified atrial flutter: Secondary | ICD-10-CM | POA: Diagnosis not present

## 2020-05-11 DIAGNOSIS — G4700 Insomnia, unspecified: Secondary | ICD-10-CM | POA: Diagnosis not present

## 2020-05-11 DIAGNOSIS — G609 Hereditary and idiopathic neuropathy, unspecified: Secondary | ICD-10-CM | POA: Diagnosis not present

## 2020-05-11 DIAGNOSIS — G894 Chronic pain syndrome: Secondary | ICD-10-CM | POA: Diagnosis not present

## 2020-05-11 DIAGNOSIS — M6281 Muscle weakness (generalized): Secondary | ICD-10-CM | POA: Diagnosis not present

## 2020-05-11 DIAGNOSIS — R2689 Other abnormalities of gait and mobility: Secondary | ICD-10-CM | POA: Diagnosis not present

## 2020-05-11 LAB — RESP PANEL BY RT-PCR (FLU A&B, COVID) ARPGX2
Influenza A by PCR: NEGATIVE
Influenza B by PCR: NEGATIVE
SARS Coronavirus 2 by RT PCR: NEGATIVE

## 2020-05-11 MED ORDER — OXYCODONE HCL 5 MG PO TABS: 5.0000 mg | ORAL_TABLET | Freq: Two times a day (BID) | ORAL | 0 refills | Status: AC | PRN

## 2020-05-11 MED ORDER — FUROSEMIDE 40 MG PO TABS: 80.0000 mg | ORAL_TABLET | Freq: Two times a day (BID) | ORAL | Status: DC

## 2020-05-11 MED ORDER — APIXABAN 5 MG PO TABS: 5.0000 mg | ORAL_TABLET | Freq: Two times a day (BID) | ORAL | Status: DC

## 2020-05-11 MED ORDER — ESCITALOPRAM OXALATE 10 MG PO TABS
5.0000 mg | ORAL_TABLET | Freq: Every day | ORAL | Status: DC
  Filled 2020-05-11: qty 1

## 2020-05-11 MED ORDER — LIDOCAINE 5 % EX PTCH: 1.0000 | MEDICATED_PATCH | CUTANEOUS | 0 refills | Status: DC

## 2020-05-11 MED ORDER — ESCITALOPRAM OXALATE 5 MG PO TABS: 5.0000 mg | ORAL_TABLET | Freq: Every day | ORAL | Status: DC

## 2020-05-11 MED ORDER — POTASSIUM CHLORIDE CRYS ER 20 MEQ PO TBCR: 40.0000 meq | EXTENDED_RELEASE_TABLET | Freq: Every morning | ORAL | Status: DC

## 2020-05-11 MED ORDER — METOPROLOL TARTRATE 25 MG PO TABS: 25.0000 mg | ORAL_TABLET | Freq: Four times a day (QID) | ORAL | Status: DC

## 2020-05-11 NOTE — Care Management Important Message (Signed)
Important Message  Patient Details  Name: Kim Orr MRN: [id number] Date of Birth: 02/26/1940   Medicare Important Message Given:  Yes     Shelda Altes 05/11/2020, 10:37 AM

## 2020-05-11 NOTE — Progress Notes (Signed)
Called [facility] at [phone] to give report. I was transferred to the nurse supervisor and sent to voicemail. Left a message with my name and number.

## 2020-05-11 NOTE — Discharge Summary (Signed)
Physician Discharge Summary  Kim Orr:[id number] DOB: [birth year] DOA: 05/03/2020  PCP: Lajean Manes, MD  Admit date: 05/03/2020 Discharge date: 05/11/2020  Admitted From: ILF Disposition:  SNF  Recommendations for Outpatient Follow-up:  1. Follow up with PCP in 1-2 weeks 2. Please obtain BMP/CBC in one week 3. Please follow up on the following pending results:  Home Health:NO  Equipment/Devices: NONE  Discharge Condition: Stable Code Status:   Code Status: Full Code Diet recommendation:  Diet Order            Diet - low sodium heart healthy           Diet Heart Room service appropriate? Yes; Fluid consistency: Thin  Diet effective now                  Brief/Interim Summary: 80 year old female history of hypothyroidism on methimazole, chronic pain syndrome secondary to chronic low back pain sciatica, hypertension, lymphedema, hyperlipidemia sent to ED by primary care doctor for new onset tachyarrhythmia. Patient was admitted for a flutter, cardiology was consulted.  Patient was placed on Cardizem drip, anticoagulation is started with Eliquis Followed by cardiology very closely diuresed with IV Lasix Transitioned to oral diuretics. Patient remains deconditioned weak and frail and will benefit with a skilled nursing facility, was waiting for approval and has been approved after peer-to-peer review 4/1. Cardiology has signed off.  She is medically stable for discharge.  Discharge Diagnoses:  Atrial flutter with rapid ventricular rate, new onset:/p TEE DCCV 3/29 and maintaining normal sinus rhythm.  Continue anticoagulant with Eliquis, continue metoprolol.  Appreciate cardiology input.  Will need close outpatient follow-up.Echo with EF normal troponin negative x2  Acute combined CHF/lymphedema/peripheral edema chronic  with some worsening past weekdue to RVR.baseline wt 260lb in December, on admission 128kg/282 lb-nicely improved to 122 kg with significant diuresis.   Continue. Lasix 80 mg twice daily, continue to monitor weight AT sf. Follow-up with the CHF clinic appointment scheduled for May .  Net IO Since Admission: -8,242.77 mL [05/11/20 1123]  Filed Weights   05/09/20 0454 05/10/20 0533 05/11/20 0435  Weight: 126 kg 121.6 kg 122.3 kg   Moderate MR/moderate TR outpatient follow-up  Hypokalemia: Stable, continue kcl 40 mew daily while on Lasix Recent Labs  Lab 05/06/20 0342 05/07/20 0207 05/08/20 0241 05/09/20 0407 05/10/20 0231  K 3.7 3.4* 3.9 4.1 3.8   Essential hypertension: Well-controlled on metoprolol.  Off lisinopril due to soft blood pressure   Hyperthyroidism: Continue methimazole TSH is stable.   Chronic low back pain/sciatica/chronic pain continue as needed oxycodone -due to ongoing chronic back pain increased to every 8 hours as needed, continue Tylenol, add lidocaine patch.Cont  muscle relaxant and her Neurontin.  Anxiety issues, continue supportive measure Ambien bedtime.  Added low-dose Lexapro  Mixed hyperlipidemia: Continue on Zocor.  Morbid obesity BMI 38:Patient will benefit with PCP follow-up,weight loss.   Deconditioning: Cont PT/OT at SNF  Consults:  Cardiology  Subjective: Alert oriented resting comfortably pain much better hoping to go to a skilled nursing facility today.  She was disappointed she could not go yesterday  Discharge Exam: Vitals:   05/11/20 0831 05/11/20 0855  BP: 113/63 114/63  Pulse:  61  Resp:    Temp: 98.6 F (37 C)   SpO2:     General: Pt is alert, awake, not in acute distress Cardiovascular: RRR, S1/S2 +, no rubs, no gallops Respiratory: CTA bilaterally, no wheezing, no rhonchi Abdominal: Soft, NT, ND, bowel sounds + Extremities:  Chronic lower extremity lymphedema, no cyanosis  Discharge Instructions  Discharge Instructions    (HEART FAILURE PATIENTS) Call MD:  Anytime you have any of the following symptoms: 1) 3 pound weight gain in 24 hours or 5 pounds in 1 week 2)  shortness of breath, with or without a dry hacking cough 3) swelling in the hands, feet or stomach 4) if you have to sleep on extra pillows at night in order to breathe.   Complete by: As directed    Diet - low sodium heart healthy   Complete by: As directed    Discharge instructions   Complete by: As directed    Please call call MD or return to ER for similar or worsening recurring problem that brought you to hospital or if any fever,nausea/vomiting,abdominal pain, uncontrolled pain, chest pain,  shortness of breath or any other alarming symptoms.  Check your CBC and BMP in 1 week  Please follow-up your doctor as instructed in a week time and call the office for appointment.  Please avoid alcohol, smoking, or any other illicit substance and maintain healthy habits including taking your regular medications as prescribed.  You were cared for by a hospitalist during your hospital stay. If you have any questions about your discharge medications or the care you received while you were in the hospital after you are discharged, you can call the unit and ask to speak with the hospitalist on call if the hospitalist that took care of you is not available.  Once you are discharged, your primary care physician will handle any further medical issues. Please note that NO REFILLS for any discharge medications will be authorized once you are discharged, as it is imperative that you return to your primary care physician (or establish a relationship with a primary care physician if you do not have one) for your aftercare needs so that they can reassess your need for medications and monitor your lab values   Increase activity slowly   Complete by: As directed      Allergies as of 05/11/2020   No Known Allergies     Medication List    STOP taking these medications   lisinopril 10 MG tablet Commonly known as: ZESTRIL   valACYclovir 1000 MG tablet Commonly known as: VALTREX     TAKE these medications    acetaminophen 650 MG CR tablet Commonly known as: TYLENOL Take 650 mg by mouth See admin instructions. Take 1,300 mg by mouth at 5 PM and during the night as needed for pain   apixaban 5 MG Tabs tablet Commonly known as: ELIQUIS Take 1 tablet (5 mg total) by mouth 2 (two) times daily.   calcium carbonate 500 MG chewable tablet Commonly known as: TUMS - dosed in mg elemental calcium Chew 2 tablets by mouth in the morning.   Centrum Silver 50+Women Tabs Take 1 tablet by mouth daily.   cyclobenzaprine 10 MG tablet Commonly known as: FLEXERIL Take 10 mg by mouth 3 (three) times daily as needed for muscle spasms.   escitalopram 5 MG tablet Commonly known as: LEXAPRO Take 1 tablet (5 mg total) by mouth daily.   furosemide 40 MG tablet Commonly known as: LASIX Take 2 tablets (80 mg total) by mouth 2 (two) times daily. What changed:   how much to take  when to take this   gabapentin 300 MG capsule Commonly known as: NEURONTIN Take 300-600 mg by mouth See admin instructions. Take 300 mg by mouth in the morning  and 600 mg at bedtime   lidocaine 5 % Commonly known as: LIDODERM Place 1 patch onto the skin daily. Remove & Discard patch within 12 hours or as directed by MD   Melatonin 10 MG Tabs Take 10 mg by mouth at bedtime.   methimazole 5 MG tablet Commonly known as: TAPAZOLE Take 5 mg by mouth daily.   metoprolol tartrate 25 MG tablet Commonly known as: LOPRESSOR Take 1 tablet (25 mg total) by mouth every 6 (six) hours.   oxyCODONE 5 MG immediate release tablet Commonly known as: Oxy IR/ROXICODONE Take 1 tablet (5 mg total) by mouth every 12 (twelve) hours as needed for up to 6 days for severe pain or moderate pain.   potassium chloride SA 20 MEQ tablet Commonly known as: KLOR-CON Take 2 tablets (40 mEq total) by mouth in the morning. Start taking on: May 12, 2020 What changed:   how much to take  when to take this   raloxifene 60 MG tablet Commonly known  as: EVISTA Take 60 mg by mouth at bedtime.   senna-docusate 8.6-50 MG tablet Commonly known as: Senokot-S Take 2 tablets by mouth See admin instructions. Take 2 tablets by mouth in the evening as needed for constipation   simvastatin 20 MG tablet Commonly known as: ZOCOR Take 20 mg by mouth at bedtime.       Follow-up Information    [facility] Follow up.   Specialty: Cardiology Why: [facility] - a follow-up has been made for you on Friday May 18, 2020 at 9:00 AM. Please arrive 15 minutes early to check in. If you call phone number listed, you will hear directions and parking garage code. Contact information: [address] [id number] [place] [phone]       Buford Dresser, MD Follow up.   Specialty: Cardiology Why: [facility] (Cardiology) - [facility] location - a follow-up has been arranged with Dr. Harrell Gave on Monday Jun 18, 2020 1:20 PM (Arrive by 1:05 PM). Contact information: [address] [place] [address] [phone]              No Known Allergies  The results of significant diagnostics from this hospitalization (including imaging, microbiology, ancillary and laboratory) are listed below for reference.    Microbiology: Recent Results (from the past 240 hour(s))  SARS CORONAVIRUS 2 (TAT 6-24 HRS) Nasopharyngeal Nasopharyngeal Swab     Status: None   Collection Time: 05/03/20  6:42 PM   Specimen: Nasopharyngeal Swab  Result Value Ref Range Status   SARS Coronavirus 2 NEGATIVE NEGATIVE Final    Comment: (NOTE) SARS-CoV-2 target nucleic acids are NOT DETECTED.  The SARS-CoV-2 RNA is generally detectable in upper and lower respiratory specimens during the acute phase of infection. Negative results do not preclude SARS-CoV-2 infection, do not rule out co-infections with other pathogens, and should not be used as the sole basis for  treatment or other patient management decisions. Negative results must be combined with clinical observations, patient history, and epidemiological information. The expected result is Negative.  Fact Sheet for Patients: SugarRoll.be  Fact Sheet for Healthcare Providers: https://www.woods-mathews.com/  This test is not yet approved or cleared by the [place] FDA and  has been authorized for detection and/or diagnosis of SARS-CoV-2 by FDA under an Emergency Use Authorization (EUA). This EUA will remain  in effect (meaning this test can be used) for the duration of the COVID-19 declaration under Se  ction 564(b)(1) of the Act, 21 U.S.C. section 360bbb-3(b)(1), unless the authorization is terminated or revoked sooner.  Performed at [facility], [place] [address], [place], [place] [address]     Procedures/Studies: DG Chest 2 View  Result Date: 05/08/2020 CLINICAL DATA:  Fever EXAM: CHEST - 2 VIEW COMPARISON:  May 03, 2020 FINDINGS: Lungs are clear. Heart is enlarged with pulmonary vascularity normal, stable. No adenopathy. No bone lesions. IMPRESSION: Stable cardiac prominence.  No edema or airspace opacity. Electronically Signed   By: Lowella Grip III M.D.   On: 05/08/2020 11:37   DG Chest Portable 1 View  Result Date: 05/03/2020 CLINICAL DATA:  Chest pain EXAM: PORTABLE CHEST 1 VIEW COMPARISON:  None. FINDINGS: Cardiomegaly with vascular congestion. Increased markings in the lung bases could reflect atelectasis or early edema. No effusions. No acute bony abnormality. IMPRESSION: Cardiomegaly, vascular congestion. Increased markings in the bases could reflect atelectasis or early edema. Electronically Signed   By: Rolm Baptise M.D.   On: 05/03/2020 18:55   ECHOCARDIOGRAM COMPLETE  Result Date: 05/04/2020    ECHOCARDIOGRAM REPORT   Patient Name:   Kim Orr Date of Exam: 05/04/2020 Medical Rec #:  [id number]        Height:        71.0 in Accession #:    [id number]       Weight:       282.6 lb Date of Birth:  03-06-1940       BSA:          2.442 m Patient Age:    38 years         BP:           103/79 mmHg Patient Gender: F                HR:           73 bpm. Exam Location:  Inpatient Procedure: 2D Echo, Cardiac Doppler, Color Doppler and Intracardiac            Opacification Agent Indications:    I48.91* Unspeicified atrial fibrillation  History:        Patient has no prior history of Echocardiogram examinations.  Sonographer:    Merrie Roof RDCS Referring Phys: [id number] [place]  1. Left ventricular ejection fraction, by estimation, is 55 to 60%. The left ventricle has normal function. The left ventricle has no regional wall motion abnormalities. Left ventricular diastolic function could not be evaluated.  2. Right ventricular systolic function is mildly reduced. The right ventricular size is moderately enlarged. There is normal pulmonary artery systolic pressure.  3. The mitral valve is normal in structure. Moderate mitral valve regurgitation. No evidence of mitral stenosis.  4. Tricuspid valve regurgitation is moderate.  5. The aortic valve is tricuspid. Aortic valve regurgitation is not visualized. Mild aortic valve sclerosis is present, with no evidence of aortic valve stenosis.  6. The inferior vena cava is dilated in size with <50% respiratory variability, suggesting right atrial pressure of 15 mmHg. FINDINGS  Left Ventricle: Left ventricular ejection fraction, by estimation, is 55 to 60%. The left ventricle has normal function. The left ventricle has no regional wall motion abnormalities. Definity contrast agent was given IV to delineate the left ventricular  endocardial borders. The left ventricular internal cavity size was normal in size. There is borderline concentric left ventricular hypertrophy. Left ventricular diastolic function could not be evaluated due to atrial fibrillation. Left ventricular diastolic  function  could not be evaluated. Right Ventricle: The right ventricular size is moderately enlarged. No increase in right ventricular wall thickness. Right ventricular systolic function is mildly reduced. There is normal pulmonary artery systolic pressure. The tricuspid regurgitant velocity is 2.26 m/s, and with an assumed right atrial pressure of 15 mmHg, the estimated right ventricular systolic pressure is 57.0 mmHg. Left Atrium: Left atrial size was normal in size. Right Atrium: Right atrial size was normal in size. Pericardium: There is no evidence of pericardial effusion. Mitral Valve: The mitral valve is normal in structure. Moderate mitral valve regurgitation. No evidence of mitral valve stenosis. Tricuspid Valve: The tricuspid valve is normal in structure. Tricuspid valve regurgitation is moderate . No evidence of tricuspid stenosis. Aortic Valve: The aortic valve is tricuspid. Aortic valve regurgitation is not visualized. Mild aortic valve sclerosis is present, with no evidence of aortic valve stenosis. Aortic valve mean gradient measures 5.0 mmHg. Aortic valve peak gradient measures 7.5 mmHg. Aortic valve area, by VTI measures 2.42 cm. Pulmonic Valve: The pulmonic valve was normal in structure. Pulmonic valve regurgitation is not visualized. No evidence of pulmonic stenosis. Aorta: The aortic root is normal in size and structure. Venous: The inferior vena cava is dilated in size with less than 50% respiratory variability, suggesting right atrial pressure of 15 mmHg. IAS/Shunts: No atrial level shunt detected by color flow Doppler.  LEFT VENTRICLE PLAX 2D LVIDd:         4.40 cm LVIDs:         3.40 cm LV PW:         1.20 cm LV IVS:        1.20 cm LVOT diam:     2.20 cm LV SV:         56 LV SV Index:   23 LVOT Area:     3.80 cm  RIGHT VENTRICLE             IVC RV Basal diam:  5.00 cm     IVC diam: 2.30 cm RV Mid diam:    4.20 cm RV S prime:     10.00 cm/s TAPSE (M-mode): 1.7 cm LEFT ATRIUM           Index        RIGHT ATRIUM           Index LA diam:      2.80 cm 1.15 cm/m  RA Area:     29.10 cm LA Vol (A4C): 65.5 ml 26.82 ml/m RA Volume:   101.00 ml 41.35 ml/m  AORTIC VALVE AV Area (Vmax):    2.59 cm AV Area (Vmean):   2.28 cm AV Area (VTI):     2.42 cm AV Vmax:           137.00 cm/s AV Vmean:          104.000 cm/s AV VTI:            0.231 m AV Peak Grad:      7.5 mmHg AV Mean Grad:      5.0 mmHg LVOT Vmax:         93.50 cm/s LVOT Vmean:        62.400 cm/s LVOT VTI:          0.147 m LVOT/AV VTI ratio: 0.64  AORTA Ao Root diam: 2.30 cm Ao Asc diam:  3.20 cm TRICUSPID VALVE TR Peak grad:   20.4 mmHg TR Vmax:        226.00 cm/s  SHUNTS Systemic VTI:  0.15 m Systemic Diam: 2.20 cm Sanda Klein MD Electronically signed by Sanda Klein MD Signature Date/Time: 05/04/2020/12:15:32 PM    Final    ECHO TEE  Result Date: 05/08/2020    TRANSESOPHOGEAL ECHO REPORT   Patient Name:   Kim Orr Date of Exam: 05/08/2020 Medical Rec #:  [id number]        Height:       71.0 in Accession #:    [id number]       Weight:       270.4 lb Date of Birth:  20-Jul-1940       BSA:          2.397 m Patient Age:    55 years         BP:           94/72 mmHg Patient Gender: F                HR:           145 bpm. Exam Location:  Inpatient Procedure: Transesophageal Echo, Cardiac Doppler and Color Doppler Indications:     Atrial flutter  History:         Patient has prior history of Echocardiogram examinations, most                  recent 05/04/2020. Arrythmias:Atrial Flutter; Risk                  Factors:Hypertension and Dyslipidemia.  Sonographer:     Clayton Lefort RDCS (AE) Referring Phys:  [id number] [place] Diagnosing Phys: Sanda Klein MD PROCEDURE: After discussion of the risks and benefits of a TEE, an informed consent was obtained from the patient. The transesophogeal probe was passed without difficulty through the esophogus of the patient. Sedation performed by different physician. The patient was monitored while under deep  sedation. Anesthestetic sedation was provided intravenously by Anesthesiology: 100mg  of Propofol, 60mg  of Lidocaine. Image quality was good. The patient developed no complications during the procedure. A successful direct current cardioversion was performed at 120 joules with 1 attempt. IMPRESSIONS  1. Left ventricular ejection fraction, by estimation, is 35 to 40%. The left ventricle has moderately decreased function.  2. Right ventricular systolic function is moderately reduced. The right ventricular size is mildly enlarged. There is normal pulmonary artery systolic pressure.  3. Left atrial size was severely dilated. No left atrial/left atrial appendage thrombus was detected. The LAA emptying velocity was 60 cm/s.  4. The pericardial effusion is circumferential.  5. The mitral valve is normal in structure. Mild to moderate mitral valve regurgitation.  6. Tricuspid valve regurgitation is moderate.  7. The aortic valve is tricuspid. Aortic valve regurgitation is not visualized. Mild aortic valve sclerosis is present, with no evidence of aortic valve stenosis.  8. There is mild (Grade II) atheroma plaque. Comparison(s): Compared with 05/04/2020, the left ventricular systolic function has worsened (heart rate on today's study is 145 bpm, as opposed to 73 bpm on the 3/25 study). FINDINGS  Left Ventricle: Left ventricular ejection fraction, by estimation, is 35 to 40%. The left ventricle has moderately decreased function. The left ventricular internal cavity size was normal in size. There is no left ventricular hypertrophy. Right Ventricle: The right ventricular size is mildly enlarged. No increase in right ventricular wall thickness. Right ventricular systolic function is moderately reduced. There is normal pulmonary artery systolic pressure. The tricuspid regurgitant velocity is 1.92 m/s, and with an assumed  right atrial pressure of 15 mmHg, the estimated right ventricular systolic pressure is 33.8 mmHg. Left Atrium:  Left atrial size was severely dilated. No left atrial/left atrial appendage thrombus was detected. The LAA emptying velocity was 60 cm/s. Right Atrium: Right atrial size was normal in size. Pericardium: Trivial pericardial effusion is present. The pericardial effusion is circumferential. Mitral Valve: The mitral valve is normal in structure. Mild to moderate mitral valve regurgitation, with centrally-directed jet. Tricuspid Valve: The tricuspid valve is grossly normal. Tricuspid valve regurgitation is moderate. Aortic Valve: The aortic valve is tricuspid. Aortic valve regurgitation is not visualized. Mild aortic valve sclerosis is present, with no evidence of aortic valve stenosis. Pulmonic Valve: The pulmonic valve was grossly normal. Pulmonic valve regurgitation is not visualized. Aorta: The aortic root, ascending aorta and aortic arch are all structurally normal, with no evidence of dilitation or obstruction. There is mild (Grade II) atheroma plaque. IAS/Shunts: There is redundancy of the interatrial septum. No atrial level shunt detected by color flow Doppler. There is no evidence of an atrial septal defect.  TRICUSPID VALVE TR Peak grad:   14.7 mmHg TR Vmax:        192.00 cm/s Dani Gobble Croitoru MD Electronically signed by Sanda Klein MD Signature Date/Time: 05/08/2020/3:17:15 PM    Final     Labs: BNP (last 3 results) Recent Labs    05/03/20 1751  BNP 250.5*   Basic Metabolic Panel: Recent Labs  Lab 05/06/20 0342 05/07/20 0207 05/08/20 0241 05/09/20 0407 05/10/20 0231  NA 141 140 141 140 139  K 3.7 3.4* 3.9 4.1 3.8  CL 104 103 105 101 104  CO2 30 30 27 31 28   GLUCOSE 104* 109* 114* 118* 119*  BUN 14 13 18 23  24*  CREATININE 0.88 0.80 0.97 1.05* 0.79  CALCIUM 8.8* 8.8* 9.2 8.9 8.6*  MG  --   --  2.1  --   --    Liver Function Tests: No results for input(s): AST, ALT, ALKPHOS, BILITOT, PROT, ALBUMIN in the last 168 hours. No results for input(s): LIPASE, AMYLASE in the last 168  hours. No results for input(s): AMMONIA in the last 168 hours. CBC: Recent Labs  Lab 05/06/20 0342 05/07/20 0207 05/08/20 0241 05/09/20 0407 05/10/20 0231  WBC 7.1 9.0 10.0 8.1 9.0  HGB 14.3 15.5* 15.8* 14.3 13.6  HCT 43.3 47.0* 46.7* 45.1 42.0  MCV 100.9* 101.3* 100.6* 104.4* 102.7*  PLT 156 173 192 204 177   Cardiac Enzymes: No results for input(s): CKTOTAL, CKMB, CKMBINDEX, TROPONINI in the last 168 hours. BNP: Invalid input(s): POCBNP CBG: Recent Labs  Lab 05/07/20 0750  GLUCAP 100*   D-Dimer No results for input(s): DDIMER in the last 72 hours. Hgb A1c No results for input(s): HGBA1C in the last 72 hours. Lipid Profile No results for input(s): CHOL, HDL, LDLCALC, TRIG, CHOLHDL, LDLDIRECT in the last 72 hours. Thyroid function studies No results for input(s): TSH, T4TOTAL, T3FREE, THYROIDAB in the last 72 hours.  Invalid input(s): FREET3 Anemia work up No results for input(s): VITAMINB12, FOLATE, FERRITIN, TIBC, IRON, RETICCTPCT in the last 72 hours. Urinalysis No results found for: COLORURINE, APPEARANCEUR, [place], [place], [place], [place], [place], [place], PROTEINUR, UROBILINOGEN, NITRITE, LEUKOCYTESUR Sepsis Labs Invalid input(s): PROCALCITONIN,  WBC,  LACTICIDVEN Microbiology Recent Results (from the past 240 hour(s))  SARS CORONAVIRUS 2 (TAT 6-24 HRS) Nasopharyngeal Nasopharyngeal Swab     Status: None   Collection Time: 05/03/20  6:42 PM   Specimen: Nasopharyngeal Swab  Result Value Ref  Range Status   SARS Coronavirus 2 NEGATIVE NEGATIVE Final    Comment: (NOTE) SARS-CoV-2 target nucleic acids are NOT DETECTED.  The SARS-CoV-2 RNA is generally detectable in upper and lower respiratory specimens during the acute phase of infection. Negative results do not preclude SARS-CoV-2 infection, do not rule out co-infections with other pathogens, and should not be used as the sole basis for treatment or other patient management decisions. Negative  results must be combined with clinical observations, patient history, and epidemiological information. The expected result is Negative.  Fact Sheet for Patients: SugarRoll.be  Fact Sheet for Healthcare Providers: https://www.woods-mathews.com/  This test is not yet approved or cleared by the [place] FDA and  has been authorized for detection and/or diagnosis of SARS-CoV-2 by FDA under an Emergency Use Authorization (EUA). This EUA will remain  in effect (meaning this test can be used) for the duration of the COVID-19 declaration under Se ction 564(b)(1) of the Act, 21 U.S.C. section 360bbb-3(b)(1), unless the authorization is terminated or revoked sooner.  Performed at [facility], [place] [address] [place], [place] [address]      Time coordinating discharge: 35 minutes  SIGNED: Antonieta Pert, MD  [facility] 05/11/2020, 11:23 AM  If 7PM-7AM, please contact night-coverage [email or web address]

## 2020-05-11 NOTE — TOC Transition Note (Signed)
Transition of Care [facility]) - CM/SW Discharge Note   Patient Details  Name: BLAKELY MARANAN MRN: [id number] Date of Birth: [birth year]  Transition of Care [facility]) CM/SW Contact:  Coralee Pesa, [place] Phone Number: 05/11/2020, 1:43 PM   Clinical Narrative:    Pt to be transported to [facility] by [facility]. Nurse to call report to [phone].    Final next level of care: Skilled Nursing Facility Barriers to Discharge: Barriers Resolved   Patient Goals and CMS Choice   CMS Medicare.gov Compare Post Acute Care list provided to:: Patient Represenative (must comment) (patients daughter [name]) Choice offered to / list presented to : Adult Children [name])  Discharge Placement              Patient chooses bed at: [facility] Patient to be transferred to facility by: [facility] Name of family member notified: John Patient and family notified of of transfer: 05/11/20  Discharge Plan and Services In-house Referral: Clinical Social Work                                   Social Determinants of Health (SDOH) Interventions     Readmission Risk Interventions No flowsheet data found.

## 2020-05-14 DIAGNOSIS — I4892 Unspecified atrial flutter: Secondary | ICD-10-CM | POA: Diagnosis not present

## 2020-05-14 DIAGNOSIS — I5042 Chronic combined systolic (congestive) and diastolic (congestive) heart failure: Secondary | ICD-10-CM | POA: Diagnosis not present

## 2020-05-14 DIAGNOSIS — G4700 Insomnia, unspecified: Secondary | ICD-10-CM | POA: Diagnosis not present

## 2020-05-14 DIAGNOSIS — G629 Polyneuropathy, unspecified: Secondary | ICD-10-CM | POA: Diagnosis not present

## 2020-05-15 DIAGNOSIS — I4892 Unspecified atrial flutter: Secondary | ICD-10-CM | POA: Diagnosis not present

## 2020-05-15 DIAGNOSIS — I10 Essential (primary) hypertension: Secondary | ICD-10-CM | POA: Diagnosis not present

## 2020-05-15 DIAGNOSIS — I5043 Acute on chronic combined systolic (congestive) and diastolic (congestive) heart failure: Secondary | ICD-10-CM | POA: Diagnosis not present

## 2020-05-15 DIAGNOSIS — M5441 Lumbago with sciatica, right side: Secondary | ICD-10-CM | POA: Diagnosis not present

## 2020-05-16 DIAGNOSIS — M5432 Sciatica, left side: Secondary | ICD-10-CM | POA: Diagnosis not present

## 2020-05-16 DIAGNOSIS — G894 Chronic pain syndrome: Secondary | ICD-10-CM | POA: Diagnosis not present

## 2020-05-18 ENCOUNTER — Ambulatory Visit (HOSPITAL_COMMUNITY): Payer: Medicare HMO | Admitting: Physician Assistant

## 2020-05-29 DIAGNOSIS — R197 Diarrhea, unspecified: Secondary | ICD-10-CM | POA: Diagnosis not present

## 2020-05-29 DIAGNOSIS — R110 Nausea: Secondary | ICD-10-CM | POA: Diagnosis not present

## 2020-06-04 DIAGNOSIS — I4892 Unspecified atrial flutter: Secondary | ICD-10-CM | POA: Diagnosis not present

## 2020-06-04 DIAGNOSIS — E0590 Thyrotoxicosis, unspecified without thyrotoxic crisis or storm: Secondary | ICD-10-CM | POA: Diagnosis not present

## 2020-06-04 DIAGNOSIS — I890 Lymphedema, not elsewhere classified: Secondary | ICD-10-CM | POA: Diagnosis not present

## 2020-06-04 DIAGNOSIS — I5043 Acute on chronic combined systolic (congestive) and diastolic (congestive) heart failure: Secondary | ICD-10-CM | POA: Diagnosis not present

## 2020-06-05 DIAGNOSIS — I10 Essential (primary) hypertension: Secondary | ICD-10-CM | POA: Diagnosis not present

## 2020-06-05 DIAGNOSIS — M545 Low back pain, unspecified: Secondary | ICD-10-CM | POA: Diagnosis not present

## 2020-06-05 DIAGNOSIS — I872 Venous insufficiency (chronic) (peripheral): Secondary | ICD-10-CM | POA: Diagnosis not present

## 2020-06-07 DIAGNOSIS — I872 Venous insufficiency (chronic) (peripheral): Secondary | ICD-10-CM | POA: Diagnosis not present

## 2020-06-13 DIAGNOSIS — M545 Low back pain, unspecified: Secondary | ICD-10-CM | POA: Diagnosis not present

## 2020-06-13 DIAGNOSIS — M1711 Unilateral primary osteoarthritis, right knee: Secondary | ICD-10-CM | POA: Diagnosis not present

## 2020-06-13 DIAGNOSIS — M25512 Pain in left shoulder: Secondary | ICD-10-CM | POA: Diagnosis not present

## 2020-06-13 DIAGNOSIS — M1612 Unilateral primary osteoarthritis, left hip: Secondary | ICD-10-CM | POA: Diagnosis not present

## 2020-06-15 DIAGNOSIS — L03115 Cellulitis of right lower limb: Secondary | ICD-10-CM | POA: Diagnosis not present

## 2020-06-15 DIAGNOSIS — M79604 Pain in right leg: Secondary | ICD-10-CM | POA: Diagnosis not present

## 2020-06-18 ENCOUNTER — Encounter: Payer: Self-pay | Admitting: Cardiology

## 2020-06-18 ENCOUNTER — Other Ambulatory Visit: Payer: Self-pay

## 2020-06-18 ENCOUNTER — Ambulatory Visit: Payer: Medicare HMO | Admitting: Cardiology

## 2020-06-18 VITALS — BP 110/56 | Ht 71.0 in | Wt 269.1 lb

## 2020-06-18 DIAGNOSIS — R600 Localized edema: Secondary | ICD-10-CM | POA: Diagnosis not present

## 2020-06-18 DIAGNOSIS — I890 Lymphedema, not elsewhere classified: Secondary | ICD-10-CM | POA: Diagnosis not present

## 2020-06-18 DIAGNOSIS — I10 Essential (primary) hypertension: Secondary | ICD-10-CM | POA: Diagnosis not present

## 2020-06-18 DIAGNOSIS — Z7189 Other specified counseling: Secondary | ICD-10-CM

## 2020-06-18 DIAGNOSIS — I071 Rheumatic tricuspid insufficiency: Secondary | ICD-10-CM

## 2020-06-18 DIAGNOSIS — E782 Mixed hyperlipidemia: Secondary | ICD-10-CM

## 2020-06-18 DIAGNOSIS — I429 Cardiomyopathy, unspecified: Secondary | ICD-10-CM

## 2020-06-18 DIAGNOSIS — I340 Nonrheumatic mitral (valve) insufficiency: Secondary | ICD-10-CM | POA: Insufficient documentation

## 2020-06-18 DIAGNOSIS — Z8679 Personal history of other diseases of the circulatory system: Secondary | ICD-10-CM | POA: Diagnosis not present

## 2020-06-18 MED ORDER — METOPROLOL SUCCINATE ER 100 MG PO TB24: 100.0000 mg | ORAL_TABLET | Freq: Every day | ORAL | 3 refills | Status: DC

## 2020-06-18 NOTE — Assessment & Plan Note (Signed)
Has had no elevated BP, runs low. Consolidating metoprolol now. No room for ACEi/ARB/ARNI/MRA today.

## 2020-06-18 NOTE — Patient Instructions (Signed)
Medication Instructions:   METOPROLOL CHANGED FROM TARTRATE TO SUCC  *If you need a refill on your cardiac medications before your next appointment, please call your pharmacy*   Lab Work:  Your physician recommends that you HAVE LAB WORK TODAY  If you have labs (blood work) drawn today and your tests are completely normal, you will receive your results only by: Marland Kitchen MyChart Message (if you have MyChart) OR . A paper copy in the mail If you have any lab test that is abnormal or we need to change your treatment, we will call you to review the results.   Testing/Procedures:  Your physician has requested that you have an echocardiogram. Echocardiography is a painless test that uses sound waves to create images of your heart. It provides your doctor with information about the size and shape of your heart and how well your heart's chambers and valves are working. This procedure takes approximately one hour. There are no restrictions for this procedure.[place] 2 MONTHS     Follow-Up: At [facility], you and your health needs are our priority.  As part of our continuing mission to provide you with exceptional heart care, we have created designated Provider Care Teams.  These Care Teams include your primary Cardiologist (physician) and Advanced Practice Providers (APPs -  Physician Assistants and Nurse Practitioners) who all work together to provide you with the care you need, when you need it.  We recommend signing up for the patient portal called "MyChart".  Sign up information is provided on this After Visit Summary.  MyChart is used to connect with patients for Virtual Visits (Telemedicine).  Patients are able to view lab/test results, encounter notes, upcoming appointments, etc.  Non-urgent messages can be sent to your provider as well.   To learn more about what you can do with MyChart, go to [email or web address].    Your next appointment:   12  month(s)  The format for your next appointment:   In Person  Provider:   Buford Dresser, MD

## 2020-06-18 NOTE — Assessment & Plan Note (Signed)
Repeat echo pending.   

## 2020-06-18 NOTE — Assessment & Plan Note (Signed)
Has recurrent cellulitis, receiving antibiotic currently through [facility] facility. No significant pitting today, mild erythema and chronic lymphedema L>R.

## 2020-06-18 NOTE — Assessment & Plan Note (Signed)
Seen during hospitalization 04/2020. In sinus rhythm today. Continue anticoagulation. CHA2DS2/VAS Stroke Risk Points=4    Points Metrics  0 Has Congestive Heart Failure:  No    Current as of 2 hours ago  0 Has Vascular Disease:  No    Current as of 2 hours ago  1 Has Hypertension:  Yes    Current as of 2 hours ago  2 Age:  80    Current as of 2 hours ago  0 Has Diabetes:  No    Current as of 2 hours ago  0 Had Stroke:  No  Had TIA:  No  Had Thromboembolism:  No    Current as of 2 hours ago  1 Female:  Yes    Current as of 2 hours ago

## 2020-06-18 NOTE — Assessment & Plan Note (Signed)
Reduced on TEE. Suspect tachycardia-induced. Consolidating metoprolol to succinate today, recheck echo. Continue furosemide and potassium, check BMET today.

## 2020-06-18 NOTE — Progress Notes (Signed)
Cardiology Office Note:    Date:  06/18/2020   ID:  Kim Orr, DOB 14-Aug-1940, MRN [id number]  PCP:  Lajean Manes, MD  Cardiologist:  Buford Dresser, MD  Referring MD: Lajean Manes, MD   CC: post hospital follow up  History of Present Illness:    Kim Orr is a 80 y.o. female with a hx of hyperthyroidism on methimazole, hypertension, lymphedema, hyperlipidemia, atrial flutter diagnosed 04/2020 who is seen for post hospital follow up. Last seen in the hospital 05/06/2020.  Today: She is accompanied by her husband, who also provides some history. She is feeling okay overall. She has not been feeling any faster heart rates lately. She reports losing 20 lbs. Her husband states her LE edema (usually L>R) has been gradually worsening with concern for cellulitis, recently given antibiotics. Normally, she does not check her at home blood pressure, but has never been told it was high.  If she needs to go out she will delay/adjust taking her fluid medication. She denies any hematuria or blood in her stool. She denies any chest pain, shortness of breath, or palpitations. No pre-syncope, syncope, or lightheadedness/dizziness. Also has no orthopnea or PND. She is not fasting today.   Past Medical History:  Diagnosis Date  . Essential hypertension 07/01/2017  . Hemorrhoids 03/16/2017  . History of nephrolithiasis 04/14/2016  . Hyperlipidemia 07/01/2017  . Hyperthyroidism 05/03/2020  . Multinodular goiter 06/01/2018   Formatting of this note is different from the original. 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  Risk of malignancy 5-15%   . Osteoarthritis 07/01/2017  . Sciatica 05/03/2020  . Status post cholecystectomy 03/16/2017   Formatting of this note might be different from the original. Robotic cholecystectomy performed 1/30 @ [place] of this note might be different from the original. Robotic cholecystectomy  performed 1/30 @ [facility]    Past Surgical History:  Procedure Laterality Date  . CARDIOVERSION N/A 05/08/2020   Procedure: CARDIOVERSION;  Surgeon: Sanda Klein, MD;  Location: [facility] ENDOSCOPY;  Service: Cardiovascular;  Laterality: N/A;  . TEE WITHOUT CARDIOVERSION N/A 05/08/2020   Procedure: TRANSESOPHAGEAL ECHOCARDIOGRAM (TEE);  Surgeon: Sanda Klein, MD;  Location: [facility] ENDOSCOPY;  Service: Cardiovascular;  Laterality: N/A;    Current Medications: Current Outpatient Medications on File Prior to Visit  Medication Sig  . acetaminophen (TYLENOL) 650 MG CR tablet Take 650 mg by mouth See admin instructions. Take 1,300 mg by mouth at 5 PM and during the night as needed for pain  . apixaban (ELIQUIS) 5 MG TABS tablet Take 1 tablet (5 mg total) by mouth 2 (two) times daily.  . calcium carbonate (TUMS - DOSED IN MG ELEMENTAL CALCIUM) 500 MG chewable tablet Chew 2 tablets by mouth in the morning.  . cyclobenzaprine (FLEXERIL) 10 MG tablet Take 10 mg by mouth 3 (three) times daily as needed for muscle spasms.  . furosemide (LASIX) 40 MG tablet Take 2 tablets (80 mg total) by mouth 2 (two) times daily.  Marland Kitchen gabapentin (NEURONTIN) 300 MG capsule Take 1,200 mg by mouth 2 (two) times daily. Take 600 mg by mouth in the morning and 600 mg at bedtime  . Melatonin 10 MG TABS Take 10 mg by mouth at bedtime.  . methimazole (TAPAZOLE) 5 MG tablet Take 5 mg by mouth daily.  . Multiple Vitamins-Minerals (CENTRUM SILVER 50+WOMEN) TABS Take 1 tablet by mouth daily.  . potassium chloride SA (KLOR-CON) 20 MEQ tablet Take 2 tablets (40 mEq total)  by mouth in the morning.  . raloxifene (EVISTA) 60 MG tablet Take 60 mg by mouth at bedtime.  . senna-docusate (SENOKOT-S) 8.6-50 MG tablet Take 2 tablets by mouth See admin instructions. Take 2 tablets by mouth in the evening as needed for constipation  . simvastatin (ZOCOR) 20 MG tablet Take 20 mg by mouth at bedtime.  . diclofenac Sodium (VOLTAREN) 1 % GEL   . oxyCODONE  (OXY IR/ROXICODONE) 5 MG immediate release tablet Take by mouth.  . predniSONE (DELTASONE) 5 MG tablet Take by mouth.   No current facility-administered medications on file prior to visit.     Allergies:   Patient has no known allergies.   Social History   Tobacco Use  . Smoking status: Never Smoker  . Smokeless tobacco: Never Used    Family History: family history includes Breast cancer in her mother; Heart disease in her father.  ROS:   Please see the history of present illness.  Additional pertinent ROS: Constitutional: Negative for chills, fever, night sweats, unintentional weight loss  HENT: Negative for ear pain and hearing loss.   Eyes: Negative for loss of vision and eye pain.  Respiratory: Negative for cough, sputum, wheezing.   Cardiovascular: See HPI. Gastrointestinal: Negative for abdominal pain, melena, and hematochezia.  Genitourinary: Negative for dysuria and hematuria.  Musculoskeletal: Positive for LE edema (L>R). Negative for falls and myalgias.  Skin: Positive for rash on her right LE (shin). Negative for itching.  Neurological: Negative for focal weakness, focal sensory changes and loss of consciousness.  Endo/Heme/Allergies: Does not bruise/bleed easily.     EKGs/Labs/Other Studies Reviewed:    The following studies were reviewed today:  TEE 05/08/2020: IMPRESSIONS    1. Left ventricular ejection fraction, by estimation, is 35 to 40%. The  left ventricle has moderately decreased function.  2. Right ventricular systolic function is moderately reduced. The right  ventricular size is mildly enlarged. There is normal pulmonary artery  systolic pressure.  3. Left atrial size was severely dilated. No left atrial/left atrial  appendage thrombus was detected. The LAA emptying velocity was 60 cm/s.  4. The pericardial effusion is circumferential.  5. The mitral valve is normal in structure. Mild to moderate mitral valve  regurgitation.  6. Tricuspid  valve regurgitation is moderate.  7. The aortic valve is tricuspid. Aortic valve regurgitation is not  visualized. Mild aortic valve sclerosis is present, with no evidence of  aortic valve stenosis.  8. There is mild (Grade II) atheroma plaque.   Comparison(s): Compared with 05/04/2020, the left ventricular systolic  function has worsened (heart rate on today's study is 145 bpm, as opposed  to 73 bpm on the 3/25 study).   Echo 05/04/20 1. Left ventricular ejection fraction, by estimation, is 55 to 60%. The  left ventricle has normal function. The left ventricle has no regional  wall motion abnormalities. Left ventricular diastolic function could not  be evaluated.  2. Right ventricular systolic function is mildly reduced. The right  ventricular size is moderately enlarged. There is normal pulmonary artery  systolic pressure.  3. The mitral valve is normal in structure. Moderate mitral valve  regurgitation. No evidence of mitral stenosis.  4. Tricuspid valve regurgitation is moderate.  5. The aortic valve is tricuspid. Aortic valve regurgitation is not  visualized. Mild aortic valve sclerosis is present, with no evidence of  aortic valve stenosis.  6. The inferior vena cava is dilated in size with <50% respiratory  variability, suggesting right atrial pressure  of 15 mmHg.   EKG:  Personally reviewed 06/18/2020: Normal sinus rhythm, iRBBB at 66 bpm  Recent Labs: 05/03/2020: B Natriuretic Peptide 242.4; TSH 4.920 05/04/2020: ALT 10 05/08/2020: Magnesium 2.1 05/10/2020: BUN 24; Creatinine, Ser 0.79; Hemoglobin 13.6; Platelets 177; Potassium 3.8; Sodium 139  Recent Lipid Panel No results found for: CHOL, TRIG, HDL, CHOLHDL, VLDL, LDLCALC, LDLDIRECT  Physical Exam:    VS:  BP (!) 110/56 (BP Location: Right Arm, Patient Position: Sitting, Cuff Size: Large)   Ht 5\' 11"  (1.803 m)   Wt 269 lb 1.6 oz (122.1 kg)   BMI 37.53 kg/m     Wt Readings from Last 3 Encounters:  06/18/20 269  lb 1.6 oz (122.1 kg)  05/11/20 269 lb 10 oz (122.3 kg)    GEN: Well nourished, well developed in no acute distress HEENT: Normal, moist mucous membranes NECK: No JVD CARDIAC: regular rhythm, normal S1 and S2, no rubs or gallops. 1/6 systolic murmur. VASCULAR: Radial and DP pulses 2+ bilaterally. No carotid bruits RESPIRATORY:  Clear to auscultation without rales, wheezing or rhonchi  ABDOMEN: Soft, non-tender, non-distended MUSCULOSKELETAL:  Ambulates independently SKIN: Warm and dry, chronic bilateral nonpitting edema, 3+ edema on L, 2+ on R. Rash on right LE (shin). NEUROLOGIC:  Alert and oriented x 3. No focal neuro deficits noted. PSYCHIATRIC:  Normal affect    ASSESSMENT:    1. History of atrial flutter   2. Essential hypertension   3. Mixed hyperlipidemia   4. Cardiomyopathy, unspecified type ([place])   5. Bilateral leg edema   6. Cardiac risk counseling   7. Counseling on health promotion and disease prevention   8. Moderate mitral regurgitation   9. Moderate tricuspid regurgitation   10. Lymphedema of both lower extremities    PLAN:    Essential hypertension Has had no elevated BP, runs low. Consolidating metoprolol now. No room for ACEi/ARB/ARNI/MRA today.  Moderate mitral regurgitation Repeat echo pending  Moderate tricuspid regurgitation Repeat echo pending  Cardiomyopathy ([place]) Reduced on TEE. Suspect tachycardia-induced. Consolidating metoprolol to succinate today, recheck echo. Continue furosemide and potassium, check BMET today.  Lymphedema of both lower extremities Has recurrent cellulitis, receiving antibiotic currently through [facility] facility. No significant pitting today, mild erythema and chronic lymphedema L>R.  Mixed hyperlipidemia No recent labs, will check today. Not fasting  Atrial flutter with rapid ventricular response ([place]) Seen during hospitalization 04/2020. In sinus rhythm today. Continue anticoagulation. CHA2DS2/VAS Stroke Risk  Points=4    Points Metrics  0 Has Congestive Heart Failure:  No    Current as of 2 hours ago  0 Has Vascular Disease:  No    Current as of 2 hours ago  1 Has Hypertension:  Yes    Current as of 2 hours ago  2 Age:  77    Current as of 2 hours ago  0 Has Diabetes:  No    Current as of 2 hours ago  0 Had Stroke:  No  Had TIA:  No  Had Thromboembolism:  No    Current as of 2 hours ago  1 Female:  Yes    Current as of 2 hours ago          Cardiac risk counseling and prevention recommendations: -recommend heart healthy/Mediterranean diet, with whole grains, fruits, vegetable, fish, lean meats, nuts, and olive oil. Limit salt. -recommend moderate walking, 3-5 times/week for 30-50 minutes each session. Aim for at least 150 minutes.week. Goal should be pace of 3 miles/hours, or walking 1.5  miles in 30 minutes -recommend avoidance of tobacco products. Avoid excess alcohol. -ASCVD risk score: The ASCVD Risk score Mikey Bussing DC Jr., et al., 2013) failed to calculate for the following reasons:   The 2013 ASCVD risk score is only valid for ages 32 to 20    Plan for follow up: Repeat echocardiogram in July. 1 year with me or sooner as needed.  Buford Dresser, MD, PhD, [facility]    Medication Adjustments/Labs and Tests Ordered: Current medicines are reviewed at length with the patient today.  Concerns regarding medicines are outlined above.  Orders Placed This Encounter  Procedures  . Basic Metabolic Panel (BMET)  . CBC  . EKG 12-Lead  . ECHOCARDIOGRAM COMPLETE   Meds ordered this encounter  Medications  . metoprolol succinate (TOPROL-XL) 100 MG 24 hr tablet    Sig: Take 1 tablet (100 mg total) by mouth daily. Take with or immediately following a meal.    Dispense:  90 tablet    Refill:  3    Replaces metoprolol tartrate    Patient Instructions  Medication Instructions:   METOPROLOL CHANGED FROM TARTRATE TO SUCC  *If you need a refill on your cardiac  medications before your next appointment, please call your pharmacy*   Lab Work:  Your physician recommends that you HAVE LAB WORK TODAY  If you have labs (blood work) drawn today and your tests are completely normal, you will receive your results only by: Marland Kitchen MyChart Message (if you have MyChart) OR . A paper copy in the mail If you have any lab test that is abnormal or we need to change your treatment, we will call you to review the results.   Testing/Procedures:  Your physician has requested that you have an echocardiogram. Echocardiography is a painless test that uses sound waves to create images of your heart. It provides your doctor with information about the size and shape of your heart and how well your heart's chambers and valves are working. This procedure takes approximately one hour. There are no restrictions for this procedure.[place] 2 MONTHS     Follow-Up: At [facility], you and your health needs are our priority.  As part of our continuing mission to provide you with exceptional heart care, we have created designated Provider Care Teams.  These Care Teams include your primary Cardiologist (physician) and Advanced Practice Providers (APPs -  Physician Assistants and Nurse Practitioners) who all work together to provide you with the care you need, when you need it.  We recommend signing up for the patient portal called "MyChart".  Sign up information is provided on this After Visit Summary.  MyChart is used to connect with patients for Virtual Visits (Telemedicine).  Patients are able to view lab/test results, encounter notes, upcoming appointments, etc.  Non-urgent messages can be sent to your provider as well.   To learn more about what you can do with MyChart, go to [email or web address].    Your next appointment:   12 month(s)  The format for your next appointment:   In Person  Provider:   Buford Dresser, MD       Total  time of encounter: 40 minutes total time of encounter, including 31 minutes spent in face-to-face patient care. This time includes coordination of care and counseling regarding atrial flutter, medications. Remainder of non-face-to-face time involved reviewing chart documents/testing relevant to the patient encounter and documentation in the medical record.  Buford Dresser, MD, PhD,  [place]   I,Mathew Stumpf,acting as a Education administrator for [employer], MD.,have documented all relevant documentation on the behalf of Buford Dresser, MD,as directed by  Buford Dresser, MD while in the presence of Buford Dresser, MD.  I, Buford Dresser, MD, have reviewed all documentation for this visit. The documentation on 06/18/20 for the exam, diagnosis, procedures, and orders are all accurate and complete.  Signed, Buford Dresser, MD PhD 06/18/2020 5:13 PM    [place]

## 2020-06-18 NOTE — Assessment & Plan Note (Signed)
No recent labs, will check today. Not fasting

## 2020-06-19 LAB — BASIC METABOLIC PANEL
BUN/Creatinine Ratio: 27 (ref 12–28)
BUN: 17 mg/dL (ref 8–27)
CO2: 28 mmol/L (ref 20–29)
Calcium: 9.2 mg/dL (ref 8.7–10.3)
Chloride: 103 mmol/L (ref 96–106)
Creatinine, Ser: 0.62 mg/dL (ref 0.57–1.00)
Glucose: 107 mg/dL — ABNORMAL HIGH (ref 65–99)
Potassium: 4.8 mmol/L (ref 3.5–5.2)
Sodium: 142 mmol/L (ref 134–144)
eGFR: 90 mL/min/{1.73_m2} (ref >59)

## 2020-06-19 LAB — CBC
Hematocrit: 41.1 % (ref 34.0–46.6)
Hemoglobin: 14 g/dL (ref 11.1–15.9)
MCH: 33.2 pg — ABNORMAL HIGH (ref 26.6–33.0)
MCHC: 34.1 g/dL (ref 31.5–35.7)
MCV: 97 fL (ref 79–97)
Platelets: 227 10*3/uL (ref 150–450)
RBC: 4.22 x10E6/uL (ref 3.77–5.28)
RDW: 12.3 % (ref 11.7–15.4)
WBC: 9.8 10*3/uL (ref 3.4–10.8)

## 2020-07-02 DIAGNOSIS — I509 Heart failure, unspecified: Secondary | ICD-10-CM | POA: Diagnosis not present

## 2020-07-02 DIAGNOSIS — R600 Localized edema: Secondary | ICD-10-CM | POA: Diagnosis not present

## 2020-07-02 DIAGNOSIS — S31819A Unspecified open wound of right buttock, initial encounter: Secondary | ICD-10-CM | POA: Diagnosis not present

## 2020-07-02 DIAGNOSIS — M7918 Myalgia, other site: Secondary | ICD-10-CM | POA: Diagnosis not present

## 2020-07-10 DIAGNOSIS — I509 Heart failure, unspecified: Secondary | ICD-10-CM | POA: Diagnosis not present

## 2020-07-10 DIAGNOSIS — R600 Localized edema: Secondary | ICD-10-CM | POA: Diagnosis not present

## 2020-07-10 DIAGNOSIS — M7918 Myalgia, other site: Secondary | ICD-10-CM | POA: Diagnosis not present

## 2020-07-10 DIAGNOSIS — S31819A Unspecified open wound of right buttock, initial encounter: Secondary | ICD-10-CM | POA: Diagnosis not present

## 2020-07-12 DIAGNOSIS — I10 Essential (primary) hypertension: Secondary | ICD-10-CM | POA: Diagnosis not present

## 2020-07-13 DIAGNOSIS — S31819A Unspecified open wound of right buttock, initial encounter: Secondary | ICD-10-CM | POA: Diagnosis not present

## 2020-07-13 DIAGNOSIS — T148XXA Other injury of unspecified body region, initial encounter: Secondary | ICD-10-CM | POA: Diagnosis not present

## 2020-07-13 DIAGNOSIS — R600 Localized edema: Secondary | ICD-10-CM | POA: Diagnosis not present

## 2020-07-13 DIAGNOSIS — I509 Heart failure, unspecified: Secondary | ICD-10-CM | POA: Diagnosis not present

## 2020-07-16 DIAGNOSIS — L03115 Cellulitis of right lower limb: Secondary | ICD-10-CM | POA: Diagnosis not present

## 2020-07-16 DIAGNOSIS — S81801A Unspecified open wound, right lower leg, initial encounter: Secondary | ICD-10-CM | POA: Diagnosis not present

## 2020-07-16 DIAGNOSIS — I509 Heart failure, unspecified: Secondary | ICD-10-CM | POA: Diagnosis not present

## 2020-07-16 DIAGNOSIS — R600 Localized edema: Secondary | ICD-10-CM | POA: Diagnosis not present

## 2020-08-15 ENCOUNTER — Other Ambulatory Visit: Payer: Self-pay

## 2020-08-15 ENCOUNTER — Telehealth (HOSPITAL_COMMUNITY): Payer: Self-pay | Admitting: Cardiology

## 2020-08-15 ENCOUNTER — Encounter (HOSPITAL_COMMUNITY): Payer: Self-pay

## 2020-08-15 ENCOUNTER — Ambulatory Visit (HOSPITAL_COMMUNITY): Payer: Medicare HMO

## 2020-08-15 ENCOUNTER — Encounter (HOSPITAL_COMMUNITY): Payer: Self-pay | Admitting: *Deleted

## 2020-08-15 NOTE — Progress Notes (Signed)
Patient ID: Kim Orr, female   DOB: Jun 13, 1940, 80 y.o.   MRN: [id number] Unable to perform full study due to patient mobility and pain level. Patient is unable to lay flat or on side due to back pain. Attempted to perform the echo with the sitting upright but the patient was having too much pain and began to cry during the exam. The exam was stopped. Dr. Harrell Gave has been notified.

## 2020-08-15 NOTE — Progress Notes (Signed)
Unable to perform full study due to patient mobility and pain level. Patient is unable to lay flat or on side due to back pain. Attempted to perform the echo with the sitting upright but the patient was having too much pain and began to cry during the exam. The exam was stopped. Dr. Harrell Gave has been notified.

## 2020-08-15 NOTE — Telephone Encounter (Signed)
Patient called and cancelled echocardiogram and did not wish to reschedule at this time with the Echo Tech. Order will be removed from the ECHO wq and when patient calls back we will reinstate the order to schedule. Thank you

## 2020-08-26 ENCOUNTER — Inpatient Hospital Stay (HOSPITAL_COMMUNITY)
Admission: EM | Admit: 2020-08-26 | Discharge: 2020-09-04 | DRG: 480 | Disposition: A | Discharge status: Skilled Nursing Facility | Payer: Medicare HMO | Attending: Internal Medicine | Admitting: Internal Medicine

## 2020-08-26 ENCOUNTER — Emergency Department (HOSPITAL_COMMUNITY): Payer: Medicare HMO

## 2020-08-26 ENCOUNTER — Inpatient Hospital Stay (HOSPITAL_COMMUNITY): Payer: Medicare HMO

## 2020-08-26 ENCOUNTER — Other Ambulatory Visit: Payer: Self-pay

## 2020-08-26 ENCOUNTER — Encounter (HOSPITAL_COMMUNITY): Payer: Self-pay

## 2020-08-26 DIAGNOSIS — Z79899 Other long term (current) drug therapy: Secondary | ICD-10-CM

## 2020-08-26 DIAGNOSIS — Y92009 Unspecified place in unspecified non-institutional (private) residence as the place of occurrence of the external cause: Secondary | ICD-10-CM

## 2020-08-26 DIAGNOSIS — Z7901 Long term (current) use of anticoagulants: Secondary | ICD-10-CM | POA: Diagnosis not present

## 2020-08-26 DIAGNOSIS — M7989 Other specified soft tissue disorders: Secondary | ICD-10-CM | POA: Diagnosis not present

## 2020-08-26 DIAGNOSIS — U071 COVID-19: Secondary | ICD-10-CM | POA: Diagnosis not present

## 2020-08-26 DIAGNOSIS — Z6837 Body mass index (BMI) 37.0-37.9, adult: Secondary | ICD-10-CM | POA: Diagnosis not present

## 2020-08-26 DIAGNOSIS — I10 Essential (primary) hypertension: Secondary | ICD-10-CM | POA: Diagnosis not present

## 2020-08-26 DIAGNOSIS — S72402D Unspecified fracture of lower end of left femur, subsequent encounter for closed fracture with routine healing: Secondary | ICD-10-CM | POA: Diagnosis not present

## 2020-08-26 DIAGNOSIS — E782 Mixed hyperlipidemia: Secondary | ICD-10-CM | POA: Diagnosis not present

## 2020-08-26 DIAGNOSIS — R000 Tachycardia, unspecified: Secondary | ICD-10-CM | POA: Diagnosis not present

## 2020-08-26 DIAGNOSIS — J984 Other disorders of lung: Secondary | ICD-10-CM | POA: Diagnosis not present

## 2020-08-26 DIAGNOSIS — I482 Chronic atrial fibrillation, unspecified: Secondary | ICD-10-CM | POA: Diagnosis not present

## 2020-08-26 DIAGNOSIS — E6609 Other obesity due to excess calories: Secondary | ICD-10-CM | POA: Diagnosis not present

## 2020-08-26 DIAGNOSIS — E0590 Thyrotoxicosis, unspecified without thyrotoxic crisis or storm: Secondary | ICD-10-CM | POA: Diagnosis not present

## 2020-08-26 DIAGNOSIS — W1830XA Fall on same level, unspecified, initial encounter: Secondary | ICD-10-CM | POA: Diagnosis present

## 2020-08-26 DIAGNOSIS — Z8249 Family history of ischemic heart disease and other diseases of the circulatory system: Secondary | ICD-10-CM

## 2020-08-26 DIAGNOSIS — S72002A Fracture of unspecified part of neck of left femur, initial encounter for closed fracture: Secondary | ICD-10-CM | POA: Insufficient documentation

## 2020-08-26 DIAGNOSIS — I959 Hypotension, unspecified: Secondary | ICD-10-CM | POA: Diagnosis not present

## 2020-08-26 DIAGNOSIS — J9601 Acute respiratory failure with hypoxia: Secondary | ICD-10-CM | POA: Diagnosis not present

## 2020-08-26 DIAGNOSIS — D62 Acute posthemorrhagic anemia: Secondary | ICD-10-CM | POA: Diagnosis not present

## 2020-08-26 DIAGNOSIS — S0990XA Unspecified injury of head, initial encounter: Secondary | ICD-10-CM | POA: Diagnosis not present

## 2020-08-26 DIAGNOSIS — W19XXXA Unspecified fall, initial encounter: Secondary | ICD-10-CM

## 2020-08-26 DIAGNOSIS — Z66 Do not resuscitate: Secondary | ICD-10-CM | POA: Diagnosis present

## 2020-08-26 DIAGNOSIS — Z743 Need for continuous supervision: Secondary | ICD-10-CM | POA: Diagnosis not present

## 2020-08-26 DIAGNOSIS — Z7952 Long term (current) use of systemic steroids: Secondary | ICD-10-CM | POA: Diagnosis not present

## 2020-08-26 DIAGNOSIS — M1711 Unilateral primary osteoarthritis, right knee: Secondary | ICD-10-CM | POA: Diagnosis present

## 2020-08-26 DIAGNOSIS — R4781 Slurred speech: Secondary | ICD-10-CM | POA: Diagnosis not present

## 2020-08-26 DIAGNOSIS — Z419 Encounter for procedure for purposes other than remedying health state, unspecified: Secondary | ICD-10-CM

## 2020-08-26 DIAGNOSIS — Z9889 Other specified postprocedural states: Secondary | ICD-10-CM | POA: Diagnosis not present

## 2020-08-26 DIAGNOSIS — S7222XA Displaced subtrochanteric fracture of left femur, initial encounter for closed fracture: Principal | ICD-10-CM | POA: Diagnosis present

## 2020-08-26 DIAGNOSIS — R52 Pain, unspecified: Secondary | ICD-10-CM

## 2020-08-26 DIAGNOSIS — R0902 Hypoxemia: Secondary | ICD-10-CM | POA: Diagnosis not present

## 2020-08-26 DIAGNOSIS — S72142A Displaced intertrochanteric fracture of left femur, initial encounter for closed fracture: Secondary | ICD-10-CM | POA: Diagnosis not present

## 2020-08-26 DIAGNOSIS — M25552 Pain in left hip: Secondary | ICD-10-CM | POA: Diagnosis not present

## 2020-08-26 DIAGNOSIS — Z8781 Personal history of (healed) traumatic fracture: Secondary | ICD-10-CM

## 2020-08-26 DIAGNOSIS — I77810 Thoracic aortic ectasia: Secondary | ICD-10-CM | POA: Diagnosis not present

## 2020-08-26 HISTORY — DX: Chronic atrial fibrillation, unspecified: I48.20

## 2020-08-26 HISTORY — DX: Do not resuscitate: Z66

## 2020-08-26 LAB — CBC WITH DIFFERENTIAL/PLATELET
Abs Immature Granulocytes: 0.06 10*3/uL (ref 0.00–0.07)
Basophils Absolute: 0 10*3/uL (ref 0.0–0.1)
Basophils Relative: 0 %
Eosinophils Absolute: 0 10*3/uL (ref 0.0–0.5)
Eosinophils Relative: 0 %
HCT: 39.6 % (ref 36.0–46.0)
Hemoglobin: 12.5 g/dL (ref 12.0–15.0)
Immature Granulocytes: 1 %
Lymphocytes Relative: 11 %
Lymphs Abs: 0.8 10*3/uL (ref 0.7–4.0)
MCH: 33.5 pg (ref 26.0–34.0)
MCHC: 31.6 g/dL (ref 30.0–36.0)
MCV: 106.2 fL — ABNORMAL HIGH (ref 80.0–100.0)
Monocytes Absolute: 0.6 10*3/uL (ref 0.1–1.0)
Monocytes Relative: 8 %
Neutro Abs: 6 10*3/uL (ref 1.7–7.7)
Neutrophils Relative %: 80 %
Platelets: 150 10*3/uL (ref 150–400)
RBC: 3.73 MIL/uL — ABNORMAL LOW (ref 3.87–5.11)
RDW: 14.1 % (ref 11.5–15.5)
WBC: 7.5 10*3/uL (ref 4.0–10.5)
nRBC: 0 % (ref 0.0–0.2)

## 2020-08-26 LAB — RESP PANEL BY RT-PCR (FLU A&B, COVID) ARPGX2
Influenza A by PCR: NEGATIVE
Influenza B by PCR: NEGATIVE
SARS Coronavirus 2 by RT PCR: POSITIVE — AB

## 2020-08-26 LAB — PROCALCITONIN: Procalcitonin: <0.1 ng/mL

## 2020-08-26 LAB — FIBRINOGEN: Fibrinogen: 393 mg/dL (ref 210–475)

## 2020-08-26 LAB — COMPREHENSIVE METABOLIC PANEL
ALT: 14 U/L (ref 0–44)
AST: 22 U/L (ref 15–41)
Albumin: 3.3 g/dL — ABNORMAL LOW (ref 3.5–5.0)
Alkaline Phosphatase: 48 U/L (ref 38–126)
Anion gap: 8 (ref 5–15)
BUN: 16 mg/dL (ref 8–23)
CO2: 26 mmol/L (ref 22–32)
Calcium: 9 mg/dL (ref 8.9–10.3)
Chloride: 103 mmol/L (ref 98–111)
Creatinine, Ser: 0.87 mg/dL (ref 0.44–1.00)
GFR, Estimated: >60 mL/min (ref >60)
Glucose, Bld: 93 mg/dL (ref 70–99)
Potassium: 4.4 mmol/L (ref 3.5–5.1)
Sodium: 137 mmol/L (ref 135–145)
Total Bilirubin: 0.7 mg/dL (ref 0.3–1.2)
Total Protein: 5.9 g/dL — ABNORMAL LOW (ref 6.5–8.1)

## 2020-08-26 LAB — D-DIMER, QUANTITATIVE: D-Dimer, Quant: 1.03 ug/mL-FEU — ABNORMAL HIGH (ref 0.00–0.50)

## 2020-08-26 LAB — LACTATE DEHYDROGENASE: LDH: 165 U/L (ref 98–192)

## 2020-08-26 LAB — GLUCOSE, CAPILLARY
Glucose-Capillary: 135 mg/dL — ABNORMAL HIGH (ref 70–99)
Glucose-Capillary: 176 mg/dL — ABNORMAL HIGH (ref 70–99)

## 2020-08-26 LAB — PROTIME-INR
INR: 1.3 — ABNORMAL HIGH (ref 0.8–1.2)
Prothrombin Time: 15.9 seconds — ABNORMAL HIGH (ref 11.4–15.2)

## 2020-08-26 LAB — FERRITIN: Ferritin: 334 ng/mL — ABNORMAL HIGH (ref 11–307)

## 2020-08-26 LAB — C-REACTIVE PROTEIN: CRP: 2.4 mg/dL — ABNORMAL HIGH (ref <1.0)

## 2020-08-26 LAB — BRAIN NATRIURETIC PEPTIDE: B Natriuretic Peptide: 91.3 pg/mL (ref 0.0–100.0)

## 2020-08-26 MED ORDER — ALBUTEROL SULFATE HFA 108 (90 BASE) MCG/ACT IN AERS
2.0000 | INHALATION_SPRAY | RESPIRATORY_TRACT | Status: DC | PRN
  Filled 2020-08-26: qty 6.7

## 2020-08-26 MED ORDER — INSULIN ASPART 100 UNIT/ML IJ SOLN
0.0000 [IU] | Freq: Three times a day (TID) | INTRAMUSCULAR | Status: DC
  Administered 2020-08-26: 2 [IU] via SUBCUTANEOUS
  Administered 2020-08-28 – 2020-08-30 (×2): 3 [IU] via SUBCUTANEOUS
  Administered 2020-08-31: 2 [IU] via SUBCUTANEOUS
  Administered 2020-09-01: 3 [IU] via SUBCUTANEOUS
  Administered 2020-09-01: 2 [IU] via SUBCUTANEOUS
  Administered 2020-09-02: 3 [IU] via SUBCUTANEOUS
  Administered 2020-09-02: 2 [IU] via SUBCUTANEOUS

## 2020-08-26 MED ORDER — POLYETHYLENE GLYCOL 3350 17 G PO PACK: 17.0000 g | PACK | Freq: Every day | ORAL | Status: DC | PRN

## 2020-08-26 MED ORDER — METHOCARBAMOL 500 MG PO TABS: 500.0000 mg | ORAL_TABLET | Freq: Four times a day (QID) | ORAL | Status: DC | PRN

## 2020-08-26 MED ORDER — LACTATED RINGERS IV SOLN: INTRAVENOUS | Status: DC

## 2020-08-26 MED ORDER — SIMVASTATIN 20 MG PO TABS
20.0000 mg | ORAL_TABLET | Freq: Every day | ORAL | Status: DC
  Administered 2020-08-26 – 2020-09-03 (×7): 20 mg via ORAL
  Filled 2020-08-26 (×9): qty 1

## 2020-08-26 MED ORDER — HYDROCODONE-ACETAMINOPHEN 5-325 MG PO TABS
1.0000 | ORAL_TABLET | Freq: Four times a day (QID) | ORAL | Status: DC | PRN
  Administered 2020-08-27: 2 via ORAL
  Filled 2020-08-26: qty 2

## 2020-08-26 MED ORDER — GUAIFENESIN-DM 100-10 MG/5ML PO SYRP
10.0000 mL | ORAL_SOLUTION | ORAL | Status: DC | PRN
  Filled 2020-08-26: qty 10

## 2020-08-26 MED ORDER — GABAPENTIN 300 MG PO CAPS
600.0000 mg | ORAL_CAPSULE | Freq: Every day | ORAL | Status: DC
  Administered 2020-08-26 – 2020-09-03 (×7): 600 mg via ORAL
  Filled 2020-08-26 (×9): qty 2

## 2020-08-26 MED ORDER — MORPHINE SULFATE (PF) 2 MG/ML IV SOLN
2.0000 mg | INTRAVENOUS | Status: DC | PRN
  Administered 2020-08-26 – 2020-08-27 (×2): 2 mg via INTRAVENOUS
  Filled 2020-08-26 (×4): qty 1

## 2020-08-26 MED ORDER — CYCLOBENZAPRINE HCL 10 MG PO TABS
5.0000 mg | ORAL_TABLET | Freq: Once | ORAL | Status: AC
  Administered 2020-08-26: 5 mg via ORAL
  Filled 2020-08-26: qty 1

## 2020-08-26 MED ORDER — SODIUM CHLORIDE 0.9 % IV SOLN
200.0000 mg | Freq: Once | INTRAVENOUS | Status: AC
  Administered 2020-08-26: 200 mg via INTRAVENOUS
  Filled 2020-08-26: qty 40

## 2020-08-26 MED ORDER — METOPROLOL SUCCINATE ER 50 MG PO TB24
100.0000 mg | ORAL_TABLET | Freq: Every day | ORAL | Status: DC
  Administered 2020-08-27 – 2020-09-04 (×8): 100 mg via ORAL
  Filled 2020-08-26 (×8): qty 2

## 2020-08-26 MED ORDER — HYDROMORPHONE HCL 1 MG/ML IJ SOLN
1.0000 mg | INTRAMUSCULAR | Status: DC | PRN
  Administered 2020-08-26: 1 mg via INTRAVENOUS
  Filled 2020-08-26: qty 1

## 2020-08-26 MED ORDER — HYDROCOD POLST-CPM POLST ER 10-8 MG/5ML PO SUER
5.0000 mL | Freq: Two times a day (BID) | ORAL | Status: DC | PRN
Start: 2020-08-26 — End: 2020-08-30

## 2020-08-26 MED ORDER — ASCORBIC ACID 500 MG PO TABS
500.0000 mg | ORAL_TABLET | Freq: Every day | ORAL | Status: DC
  Administered 2020-08-26 – 2020-09-04 (×8): 500 mg via ORAL
  Filled 2020-08-26 (×8): qty 1

## 2020-08-26 MED ORDER — ZINC SULFATE 220 (50 ZN) MG PO CAPS
220.0000 mg | ORAL_CAPSULE | Freq: Every day | ORAL | Status: DC
  Administered 2020-08-26 – 2020-09-03 (×8): 220 mg via ORAL
  Filled 2020-08-26 (×9): qty 1

## 2020-08-26 MED ORDER — BISACODYL 5 MG PO TBEC: 5.0000 mg | DELAYED_RELEASE_TABLET | Freq: Every day | ORAL | Status: DC | PRN

## 2020-08-26 MED ORDER — SODIUM CHLORIDE 0.9 % IV SOLN
100.0000 mg | Freq: Every day | INTRAVENOUS | Status: AC
  Administered 2020-08-27 – 2020-08-30 (×4): 100 mg via INTRAVENOUS
  Filled 2020-08-26 (×4): qty 20

## 2020-08-26 MED ORDER — GABAPENTIN 300 MG PO CAPS
300.0000 mg | ORAL_CAPSULE | Freq: Every day | ORAL | Status: DC
  Administered 2020-08-27: 300 mg via ORAL
  Filled 2020-08-26: qty 1

## 2020-08-26 MED ORDER — DOCUSATE SODIUM 100 MG PO CAPS
100.0000 mg | ORAL_CAPSULE | Freq: Two times a day (BID) | ORAL | Status: DC
  Administered 2020-08-26 – 2020-09-04 (×13): 100 mg via ORAL
  Filled 2020-08-26 (×15): qty 1

## 2020-08-26 MED ORDER — METHOCARBAMOL 1000 MG/10ML IJ SOLN
500.0000 mg | Freq: Four times a day (QID) | INTRAVENOUS | Status: DC | PRN
  Filled 2020-08-26: qty 5

## 2020-08-26 MED ORDER — METHIMAZOLE 5 MG PO TABS
5.0000 mg | ORAL_TABLET | Freq: Every day | ORAL | Status: DC
  Administered 2020-08-28 – 2020-09-04 (×7): 5 mg via ORAL
  Filled 2020-08-26 (×9): qty 1

## 2020-08-26 MED ORDER — DEXAMETHASONE SODIUM PHOSPHATE 10 MG/ML IJ SOLN
10.0000 mg | INTRAMUSCULAR | Status: DC
Start: 2020-08-26 — End: 2020-08-30
  Administered 2020-08-26 – 2020-08-29 (×4): 10 mg via INTRAVENOUS
  Filled 2020-08-26 (×4): qty 1

## 2020-08-26 MED ORDER — MELATONIN 5 MG PO TABS
10.0000 mg | ORAL_TABLET | Freq: Every day | ORAL | Status: DC
  Administered 2020-08-26 – 2020-09-03 (×7): 10 mg via ORAL
  Filled 2020-08-26 (×9): qty 2

## 2020-08-26 NOTE — Progress Notes (Signed)
Ortho tech called for bucks traction application.

## 2020-08-26 NOTE — ED Notes (Signed)
Patient transported to X-ray 

## 2020-08-26 NOTE — H&P (Signed)
History and Physical    Kim Orr DDU:[id number] DOB: 12/29/1940 DOA: 08/26/2020  PCP: Lajean Manes, MD Consultants:  Harrell Gave - cardiology; Mardelle Matte - orthopedics Patient coming from: [facility], lives with husband; NOKHassan Buckler, [phone], [phone]  Chief Complaint: Fall  HPI: Kim Orr is a 80 y.o. female with medical history significant of HTN; HLD: obesity; and hyperthyroidism presenting with fall.   The patient just received Dilaudid and is unable to provide history.    I spoke with her daughter.  She was admitted in March with new-onset afib and Eliquis was added at that time.  Her daughter denies recent issues with COVID.  She has significant arthritis in her R knee and L hip, does not walk well at baseline.    Her husband reports that she was here in March for afib -> rehab.  She had trouble getting into her hospital bed last night for uncertain reason.  This AM, she had difficulty getting up from the recliner.  She walked maybe 30 feet to the bathroom and fell.  She complained of a sore throat and coughing starting maybe Friday.  No SOB to his knowledge.  She prefers Dr. Mardelle Matte - they have seen him regularly.  He thinks she did take her medications this AM but is not certain about this.  He has noticed some memory impairment at baseline.      ED Course: Fall, hip fracture.  COVID +.  Recent weakness leading to mechanical fall.  Review of Systems: Unable to perform  Ambulatory Status:  Ambulates with a walker or wheelchair  COVID Vaccine Status:   Complete plus 2 boosters  Past Medical History:  Diagnosis Date   Atrial fibrillation, chronic ([place]) 08/26/2020   DNR (do not resuscitate) 08/26/2020   Essential hypertension 07/01/2017   Hemorrhoids 03/16/2017   History of nephrolithiasis 04/14/2016   Hyperlipidemia 07/01/2017   Hyperthyroidism 05/03/2020   Multinodular goiter 06/01/2018   Formatting of this note is different from the  original. 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  Risk of malignancy 5-15%     Osteoarthritis 07/01/2017   Sciatica 05/03/2020   Status post cholecystectomy 03/16/2017   Formatting of this note might be different from the original. Robotic cholecystectomy performed 1/30 @ [place] of this note might be different from the original. Robotic cholecystectomy performed 1/30 @ [facility]    Past Surgical History:  Procedure Laterality Date   CARDIOVERSION N/A 05/08/2020   Procedure: CARDIOVERSION;  Surgeon: Sanda Klein, MD;  Location: [place];  Service: Cardiovascular;  Laterality: N/A;   TEE WITHOUT CARDIOVERSION N/A 05/08/2020   Procedure: TRANSESOPHAGEAL ECHOCARDIOGRAM (TEE);  Surgeon: Sanda Klein, MD;  Location: [facility] ENDOSCOPY;  Service: Cardiovascular;  Laterality: N/A;    Social History   Socioeconomic History   Marital status: Married    Spouse name: Not on file   Number of children: Not on file   Years of education: Not on file   Highest education level: Not on file  Occupational History   Not on file  Tobacco Use   Smoking status: Never   Smokeless tobacco: Never  Substance and Sexual Activity   Alcohol use: Not on file   Drug use: Not on file   Sexual activity: Not on file  Other Topics Concern   Not on file  Social History Narrative   Not on file   Social Determinants of Health   Financial Resource Strain: Not on file  Food Insecurity: Not on file  Transportation Needs: Not on file  Physical Activity: Not on file  Stress: Not on file  Social Connections: Not on file  Intimate Partner Violence: Not on file    No Known Allergies  Family History  Problem Relation Age of Onset   Breast cancer Mother    Heart disease Father     Prior to Admission medications   Medication Sig Start Date End Date Taking? Authorizing Provider  acetaminophen (TYLENOL) 650 MG CR tablet Take 650 mg by mouth See admin  instructions. Take 1,300 mg by mouth at 5 PM and during the night as needed for pain    [provider]  apixaban (ELIQUIS) 5 MG TABS tablet Take 1 tablet (5 mg total) by mouth 2 (two) times daily. 05/11/20   Antonieta Pert, MD  calcium carbonate (TUMS - DOSED IN MG ELEMENTAL CALCIUM) 500 MG chewable tablet Chew 2 tablets by mouth in the morning.    [provider]  cyclobenzaprine (FLEXERIL) 10 MG tablet Take 10 mg by mouth 3 (three) times daily as needed for muscle spasms.    [provider]  diclofenac Sodium (VOLTAREN) 1 % GEL     [provider]  furosemide (LASIX) 40 MG tablet Take 2 tablets (80 mg total) by mouth 2 (two) times daily. 05/11/20   Antonieta Pert, MD  gabapentin (NEURONTIN) 300 MG capsule Take 1,200 mg by mouth 2 (two) times daily. Take 600 mg by mouth in the morning and 600 mg at bedtime    [provider]  Melatonin 10 MG TABS Take 10 mg by mouth at bedtime.    [provider]  methimazole (TAPAZOLE) 5 MG tablet Take 5 mg by mouth daily.    [provider]  metoprolol succinate (TOPROL-XL) 100 MG 24 hr tablet Take 1 tablet (100 mg total) by mouth daily. Take with or immediately following a meal. 06/18/20 06/13/21  Buford Dresser, MD  Multiple Vitamins-Minerals (CENTRUM SILVER 50+WOMEN) TABS Take 1 tablet by mouth daily.    [provider]  oxyCODONE (OXY IR/ROXICODONE) 5 MG immediate release tablet Take by mouth. 06/05/20   [provider]  potassium chloride SA (KLOR-CON) 20 MEQ tablet Take 2 tablets (40 mEq total) by mouth in the morning. 05/12/20   Antonieta Pert, MD  predniSONE (DELTASONE) 5 MG tablet Take by mouth. 06/05/20   [provider]  raloxifene (EVISTA) 60 MG tablet Take 60 mg by mouth at bedtime.    [provider]  senna-docusate (SENOKOT-S) 8.6-50 MG tablet Take 2 tablets by mouth See admin instructions. Take 2 tablets by mouth in the evening as needed for constipation    [provider]  simvastatin (ZOCOR) 20 MG tablet Take 20 mg by mouth at bedtime.    [provider]    Physical Exam: Vitals:   08/26/20 1430 08/26/20 1500 08/26/20 1530 08/26/20 1623  BP: (!) 107/58 (!) 115/58  (!) 107/56  Pulse: 72 77 68 77  Resp: 17 20 14 16   Temp:    (!) 97.3 F (36.3 C)  TempSrc:    Oral  SpO2: 96% 90% 92% 94%  Weight:      Height:         General:  Just received Dilaudid - very sleepy, unable to effectively answer questions Eyes:   normal lids, iris ENT: hard of hearing,  grossly normal lips & tongue, mmm Neck:  no LAD, masses or thyromegaly Cardiovascular:  RRR, no  m/r/g. 2+ LE edema.  Respiratory:   Scattered rhonchi.  Mildly increased respiratory effort.   Abdomen:  soft, NT, ND Skin:  no rash or induration seen on limited exam Musculoskeletal:  L leg is shortened and medially rotated Psychiatric:  somnolent mood and affect, speech slurred, confused post Dilaudid Neurologic:  unable to perform    Radiological Exams on Admission: Independently reviewed - see discussion in A/P where applicable  CT Head Wo Contrast  Result Date: 08/26/2020 CLINICAL DATA:  Poly trauma EXAM: CT HEAD WITHOUT CONTRAST TECHNIQUE: Contiguous axial images were obtained from the base of the skull through the vertex without intravenous contrast. COMPARISON:  None. FINDINGS: Brain: No evidence of acute infarction, hemorrhage, hydrocephalus, extra-axial collection or mass lesion/mass effect. Vascular: No hyperdense vessel or unexpected calcification. Skull: Normal. Negative for fracture or focal lesion. Sinuses/Orbits: Mild polypoid mucosal thickening of the maxillary sinuses. Other: None. IMPRESSION: No acute intracranial abnormality. Electronically Signed   By: Fidela Salisbury M.D.   On: 08/26/2020 12:14   DG Hip Unilat With Pelvis 2-3 Views Left  Result Date: 08/26/2020 CLINICAL DATA:  Fall with left hip pain EXAM: DG HIP (WITH OR WITHOUT PELVIS) 2-3V LEFT  COMPARISON:  None. FINDINGS: Acute moderately displaced and angulated subtrochanteric fracture of the proximal left femur. Fracture involvement of the intertrochanteric region difficult to exclude. Moderate osteoarthritis of both hips. Extensive heterotopic ossification about the left hip. No dislocation. No additional fractures are identified. IMPRESSION: Acute moderately displaced and angulated subtrochanteric fracture of the proximal left femur. Electronically Signed   By: Davina Poke D.O.   On: 08/26/2020 12:05    EKG: Independently reviewed.  NSR with rate 68; nonspecific ST changes with no evidence of acute ischemia   Labs on Admission: I have personally reviewed the available labs and imaging studies at the time of the admission.  Pertinent labs:   Unremarkable CMP Unremarkable CBC INR 1.3 COVID POSITIVE   Assessment/Plan Principal Problem:   Closed left hip fracture, initial encounter [facility]) Active Problems:   Essential hypertension   Mixed hyperlipidemia   Hyperthyroidism   Atrial fibrillation, chronic (HCC)   Class 2 obesity due to excess calories with body mass index (BMI) of 37.0 to 37.9 in adult   DNR (do not resuscitate)     Hip fracture -Mechanical fall resulting in hip fracture, likely associated with weakness from COVID infection -Orthopedics consult  -NPO after midnight in anticipation of surgical repair tomorrow -SCDs overnight, start Lovenox post-operatively (or as per ortho) -Pain control with Robxain, Vicodin, and Morphine prn -TOC consult for rehab placement at [facility] -Will need PT consult post-operatively -Hip fracture order set utilized  COVID-19 Infection -Patient with presenting with sore throat, cough, weakness -She does not have a usual home O2 requirement and is currently requiring [place] O2 -COVID POSITIVE -The patient has comorbidities which may increase the risk for ARDS/MODS including: age, HTN, obesity -CXR ordered -At this time, will  attempt to avoid use of aerosolized medications and use HFAs instead -Will check daily labs including BMP with Mag, Phos; LFTs; CBC with differential; CRP; ferritin; fibrinogen; D-dimer -Will order steroids and Remdesivir (pharmacy consult) given +COVID test, hypoxia <94% on room air -If the patient shows clinical deterioration, consider transfer to ICU with PCCM consultation -Will attempt to maintain euvolemia to a net negative fluid status -Patient was seen wearing full PPE including: gown, gloves, head cover, N95, and face shield; donning and doffing was in compliance with current standards.  Afib -Recently diagnosed -Hold  Eliquis in anticipation of surgery -Rate controlled with Toprol XL  Hyperthyroidism -Continue Tapazol  HTN -Continue Toprol XL -Hold Demadex  HLD -Continue Zocor  Obesity -Body mass index is 37.54 kg/m..  -Weight loss should be encouraged -Outpatient PCP/bariatric medicine f/u encouraged      Level of care: Telemetry Medical DVT prophylaxis:  SCDs until approved for Lovenox by orthopedics Code Status:  DNR - confirmed with patient/family Family Communication: None present; I spoke with the patient's daughter and husband by telephone. Disposition Plan:  The patient is from: [place]  Anticipated d/c is to: [facility]  Anticipated d/c date will depend on clinical response to treatment, likely a number of days  Patient is currently: acutely ill Consults called: Orthopedics; SW, Nutrition; will need PT post-operatively  Admission status: Admit - It is my clinical opinion that admission to INPATIENT is reasonable and necessary because of the expectation that this patient will require hospital care that crosses at least 2 midnights to treat this condition based on the medical complexity of the problems presented.  Given the aforementioned information, the predictability of an adverse outcome is felt to be significant.      Karmen Bongo  MD [facility]   How to contact the [facility] Attending or Consulting provider [place] or covering provider during after hours [place], for this patient?  Check the care team in [facility] and look for a) attending/consulting TR[facility] provider listed and b) the [facility] team listed Log into [email or web address] and use [place]'s universal password to access. If you do not have the password, please contact the hospital operator. Locate the [facility] provider you are looking for under [facility] and page to a number that you can be directly reached. If you still have difficulty reaching the provider, please page the [facility] (Director on Call) for the Hospitalists listed on amion for assistance.   08/26/2020, 6:35 PM

## 2020-08-26 NOTE — ED Triage Notes (Signed)
Pt arrived via [facility] from [place] [employer] for a fall. Pt told EMS her legs gave out on her and she fell on her right side, but c/o left upper leg pain. Pt denies hitting head. Per EMS, pt told them she has had slurred speech x2 days and EMS noticed a left facial droop. EMS gave fentanyl 171mcg IM. Pt is A&Ox4. NSR on monitor. VSS

## 2020-08-26 NOTE — ED Notes (Signed)
Patient transported to CT 

## 2020-08-26 NOTE — Consult Note (Signed)
Reason for Consult: left comminuted subtrochanteric proximal femur fracture Referring Physician: Lorin Mercy, MD  Kim Orr is an 80 y.o. female.  HPI: Her husband reports that she was here in March for afib -> rehab.  She had trouble getting into her hospital bed last night for uncertain reason.  This AM, she had difficulty getting up from the recliner.  She walked maybe 30 feet to the bathroom and fell.  She complained of a sore throat and coughing starting maybe Friday.  No SOB to his knowledge.  She prefers Dr. Mardelle Matte - they have seen him regularly.  He thinks she did take her medications this AM but is not certain about this.  He has noticed some memory impairment at baseline.    Past Medical History:  Diagnosis Date   Atrial fibrillation, chronic ([place]) 08/26/2020   DNR (do not resuscitate) 08/26/2020   Essential hypertension 07/01/2017   Hemorrhoids 03/16/2017   History of nephrolithiasis 04/14/2016   Hyperlipidemia 07/01/2017   Hyperthyroidism 05/03/2020   Multinodular goiter 06/01/2018   Formatting of this note is different from the original. 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  Risk of malignancy 5-15%     Osteoarthritis 07/01/2017   Sciatica 05/03/2020   Status post cholecystectomy 03/16/2017   Formatting of this note might be different from the original. Robotic cholecystectomy performed 1/30 @ [place] of this note might be different from the original. Robotic cholecystectomy performed 1/30 @ [facility]    Past Surgical History:  Procedure Laterality Date   CARDIOVERSION N/A 05/08/2020   Procedure: CARDIOVERSION;  Surgeon: Sanda Klein, MD;  Location: [place];  Service: Cardiovascular;  Laterality: N/A;   TEE WITHOUT CARDIOVERSION N/A 05/08/2020   Procedure: TRANSESOPHAGEAL ECHOCARDIOGRAM (TEE);  Surgeon: Sanda Klein, MD;  Location: [facility] ENDOSCOPY;  Service: Cardiovascular;  Laterality: N/A;    Family History   Problem Relation Age of Onset   Breast cancer Mother    Heart disease Father     Social History:  reports that she has never smoked. She has never used smokeless tobacco. No history on file for alcohol use and drug use.  Allergies: No Known Allergies  Medications: I have reviewed the patient's current medications. Scheduled:  vitamin C  500 mg Oral Daily   dexamethasone (DECADRON) injection  10 mg Intravenous Q24H   docusate sodium  100 mg Oral BID   [START ON 08/27/2020] gabapentin  300 mg Oral Daily   gabapentin  600 mg Oral QHS   insulin aspart  0-15 Units Subcutaneous TID WC   melatonin  10 mg Oral QHS   [START ON 08/27/2020] methimazole  5 mg Oral Daily   [START ON 08/27/2020] metoprolol succinate  100 mg Oral Daily   simvastatin  20 mg Oral QHS   zinc sulfate  220 mg Oral Daily    Results for orders placed or performed during the hospital encounter of 08/26/20 (from the past 24 hour(s))  Comprehensive metabolic panel     Status: Abnormal   Collection Time: 08/26/20 11:03 AM  Result Value Ref Range   Sodium 137 135 - 145 mmol/L   Potassium 4.4 3.5 - 5.1 mmol/L   Chloride 103 98 - 111 mmol/L   CO2 26 22 - 32 mmol/L   Glucose, Bld 93 70 - 99 mg/dL   BUN 16 8 - 23 mg/dL   Creatinine, Ser 0.87 0.44 - 1.00 mg/dL   Calcium 9.0 8.9 - 10.3 mg/dL  Total Protein 5.9 (L) 6.5 - 8.1 g/dL   Albumin 3.3 (L) 3.5 - 5.0 g/dL   AST 22 15 - 41 U/L   ALT 14 0 - 44 U/L   Alkaline Phosphatase 48 38 - 126 U/L   Total Bilirubin 0.7 0.3 - 1.2 mg/dL   GFR, Estimated >60 >60 mL/min   Anion gap 8 5 - 15  Brain natriuretic peptide     Status: None   Collection Time: 08/26/20 11:03 AM  Result Value Ref Range   B Natriuretic Peptide 91.3 0.0 - 100.0 pg/mL  CBC with Differential     Status: Abnormal   Collection Time: 08/26/20 11:03 AM  Result Value Ref Range   WBC 7.5 4.0 - 10.5 K/uL   RBC 3.73 (L) 3.87 - 5.11 MIL/uL   Hemoglobin 12.5 12.0 - 15.0 g/dL   HCT 39.6 36.0 - 46.0 %   MCV 106.2  (H) 80.0 - 100.0 fL   MCH 33.5 26.0 - 34.0 pg   MCHC 31.6 30.0 - 36.0 g/dL   RDW 14.1 11.5 - 15.5 %   Platelets 150 150 - 400 K/uL   nRBC 0.0 0.0 - 0.2 %   Neutrophils Relative % 80 %   Neutro Abs 6.0 1.7 - 7.7 K/uL   Lymphocytes Relative 11 %   Lymphs Abs 0.8 0.7 - 4.0 K/uL   Monocytes Relative 8 %   Monocytes Absolute 0.6 0.1 - 1.0 K/uL   Eosinophils Relative 0 %   Eosinophils Absolute 0.0 0.0 - 0.5 K/uL   Basophils Relative 0 %   Basophils Absolute 0.0 0.0 - 0.1 K/uL   Immature Granulocytes 1 %   Abs Immature Granulocytes 0.06 0.00 - 0.07 K/uL  Protime-INR     Status: Abnormal   Collection Time: 08/26/20 11:03 AM  Result Value Ref Range   Prothrombin Time 15.9 (H) 11.4 - 15.2 seconds   INR 1.3 (H) 0.8 - 1.2  Resp Panel by RT-PCR (Flu A&B, Covid) Nasopharyngeal Swab     Status: Abnormal   Collection Time: 08/26/20 12:35 PM   Specimen: Nasopharyngeal Swab; Nasopharyngeal(NP) swabs in vial transport medium  Result Value Ref Range   SARS Coronavirus 2 by RT PCR POSITIVE (A) NEGATIVE   Influenza A by PCR NEGATIVE NEGATIVE   Influenza B by PCR NEGATIVE NEGATIVE  C-reactive protein     Status: Abnormal   Collection Time: 08/26/20  2:23 PM  Result Value Ref Range   CRP 2.4 (H) <1.0 mg/dL  D-dimer, quantitative     Status: Abnormal   Collection Time: 08/26/20  2:23 PM  Result Value Ref Range   D-Dimer, Quant 1.03 (H) 0.00 - 0.50 ug/mL-FEU  Ferritin     Status: Abnormal   Collection Time: 08/26/20  2:23 PM  Result Value Ref Range   Ferritin 334 (H) 11 - 307 ng/mL  Fibrinogen     Status: None   Collection Time: 08/26/20  2:23 PM  Result Value Ref Range   Fibrinogen 393 210 - 475 mg/dL  Lactate dehydrogenase     Status: None   Collection Time: 08/26/20  2:23 PM  Result Value Ref Range   LDH 165 98 - 192 U/L  Procalcitonin     Status: None   Collection Time: 08/26/20  2:23 PM  Result Value Ref Range   Procalcitonin <0.10 ng/mL  Glucose, capillary     Status: Abnormal    Collection Time: 08/26/20  6:04 PM  Result Value Ref Range  Glucose-Capillary 135 (H) 70 - 99 mg/dL     X-ray: CLINICAL DATA:  Fall with left hip pain   EXAM: DG HIP (WITH OR WITHOUT PELVIS) 2-3V LEFT   COMPARISON:  None.   FINDINGS: Acute moderately displaced and angulated subtrochanteric fracture of the proximal left femur. Fracture involvement of the intertrochanteric region difficult to exclude. Moderate osteoarthritis of both hips. Extensive heterotopic ossification about the left hip. No dislocation. No additional fractures are identified.   IMPRESSION: Acute moderately displaced and angulated subtrochanteric fracture of the proximal left femur.     Electronically Signed   By: Davina Poke D.O.  ROS: As noted in her admitting H&P Pertinent for recent a-fib diagnosis and current injury  Blood pressure (!) 115/49, pulse 67, temperature 97.8 F (36.6 C), temperature source Oral, resp. rate 16, height 5\' 11"  (1.803 m), weight 122.1 kg, SpO2 98 %.  Physical Exam: General:  Just received Dilaudid - very sleepy, unable to effectively answer questions Eyes:   normal lids, iris ENT: hard of hearing,  grossly normal lips & tongue, mmm Neck:  no LAD, masses or thyromegaly Cardiovascular:  RRR, no m/r/g. 2+ LE edema. Respiratory:   Scattered rhonchi.  Mildly increased respiratory effort.   Abdomen:  soft, NT, ND Skin:  no rash or induration seen on limited exam Musculoskeletal:  L leg is shortened and medially rotated. Pain with movement Psychiatric:  somnolent mood and affect, speech slurred, confused post Dilaudid Neurologic:  unable to perform  Assessment/Plan: Left comminuted subtrochanteric proximal femur fracture  Plan: Admit to hospitalist Hold Eliquis NPO after midnight for possible OR tomorrow versus Tuesday I will contact [facility] per admission notes and request of family. [place] traction (10lbs) and muscle relaxers in addition to pain meds in  interim  Mauri Pole 08/26/2020, 9:45 PM

## 2020-08-26 NOTE — ED Provider Notes (Signed)
[facility] EMERGENCY DEPARTMENT Provider Note   CSN: [id number] Arrival date & time: 08/26/20  1106     History Chief Complaint  Patient presents with   Kim Orr is a 80 y.o. female.  HPI Patient presents via EMS with multiple concerns.  History is obtained by the patient and EMS providers. Patient arrives from a nursing facility after fall that occurred today.  She states that her legs gave out and she fell.  Since that time she has had pain in her left upper thigh.  No head trauma.  Pain is severe, not substantially improved with fentanyl 100 mcg provided in route.  EMS reports no hemodynamic instability in route, questionable left facial droop, otherwise no neuro complaints, deficits, but the patient's husband complained of speech changes over the past 2 days.  Patient is on blood thinning medication.     Past Medical History:  Diagnosis Date   Essential hypertension 07/01/2017   Hemorrhoids 03/16/2017   History of nephrolithiasis 04/14/2016   Hyperlipidemia 07/01/2017   Hyperthyroidism 05/03/2020   Multinodular goiter 06/01/2018   Formatting of this note is different from the original. 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  Risk of malignancy 5-15%     Osteoarthritis 07/01/2017   Sciatica 05/03/2020   Status post cholecystectomy 03/16/2017   Formatting of this note might be different from the original. Robotic cholecystectomy performed 1/30 @ [place] of this note might be different from the original. Robotic cholecystectomy performed 1/30 @ [facility]    Patient Active Problem List   Diagnosis Date Noted   Cardiomyopathy ([place]) 06/18/2020   Moderate mitral regurgitation 06/18/2020   Moderate tricuspid regurgitation 06/18/2020   Atrial fibrillation with RVR ([place]) 05/04/2020   Atrial flutter with rapid ventricular response ([place]) 05/03/2020   Hyperthyroidism 05/03/2020   Sciatica of left  side associated with disorder of lumbosacral spine 05/03/2020   Lymphedema of both lower extremities 05/03/2020   Multinodular goiter 06/01/2018   Essential hypertension 07/01/2017   Mixed hyperlipidemia 07/01/2017   Osteoarthritis 07/01/2017   Hemorrhoids 03/16/2017    Past Surgical History:  Procedure Laterality Date   CARDIOVERSION N/A 05/08/2020   Procedure: CARDIOVERSION;  Surgeon: Sanda Klein, MD;  Location: [place];  Service: Cardiovascular;  Laterality: N/A;   TEE WITHOUT CARDIOVERSION N/A 05/08/2020   Procedure: TRANSESOPHAGEAL ECHOCARDIOGRAM (TEE);  Surgeon: Sanda Klein, MD;  Location: [facility] ENDOSCOPY;  Service: Cardiovascular;  Laterality: N/A;     OB History   No obstetric history on file.     Family History  Problem Relation Age of Onset   Breast cancer Mother    Heart disease Father     Social History   Tobacco Use   Smoking status: Never   Smokeless tobacco: Never    Home Medications Prior to Admission medications   Medication Sig Start Date End Date Taking? Authorizing Provider  acetaminophen (TYLENOL) 650 MG CR tablet Take 650 mg by mouth See admin instructions. Take 1,300 mg by mouth at 5 PM and during the night as needed for pain    [provider]  apixaban (ELIQUIS) 5 MG TABS tablet Take 1 tablet (5 mg total) by mouth 2 (two) times daily. 05/11/20   Antonieta Pert, MD  calcium carbonate (TUMS - DOSED IN MG ELEMENTAL CALCIUM) 500 MG chewable tablet Chew 2 tablets by mouth in the morning.    [provider]  cyclobenzaprine (FLEXERIL) 10 MG tablet  Take 10 mg by mouth 3 (three) times daily as needed for muscle spasms.    [provider]  diclofenac Sodium (VOLTAREN) 1 % GEL     [provider]  furosemide (LASIX) 40 MG tablet Take 2 tablets (80 mg total) by mouth 2 (two) times daily. 05/11/20   Antonieta Pert, MD  gabapentin (NEURONTIN) 300 MG capsule Take 1,200 mg by mouth 2 (two) times daily. Take 600 mg by mouth in the  morning and 600 mg at bedtime    [provider]  Melatonin 10 MG TABS Take 10 mg by mouth at bedtime.    [provider]  methimazole (TAPAZOLE) 5 MG tablet Take 5 mg by mouth daily.    [provider]  metoprolol succinate (TOPROL-XL) 100 MG 24 hr tablet Take 1 tablet (100 mg total) by mouth daily. Take with or immediately following a meal. 06/18/20 06/13/21  Buford Dresser, MD  Multiple Vitamins-Minerals (CENTRUM SILVER 50+WOMEN) TABS Take 1 tablet by mouth daily.    [provider]  oxyCODONE (OXY IR/ROXICODONE) 5 MG immediate release tablet Take by mouth. 06/05/20   [provider]  potassium chloride SA (KLOR-CON) 20 MEQ tablet Take 2 tablets (40 mEq total) by mouth in the morning. 05/12/20   Antonieta Pert, MD  predniSONE (DELTASONE) 5 MG tablet Take by mouth. 06/05/20   [provider]  raloxifene (EVISTA) 60 MG tablet Take 60 mg by mouth at bedtime.    [provider]  senna-docusate (SENOKOT-S) 8.6-50 MG tablet Take 2 tablets by mouth See admin instructions. Take 2 tablets by mouth in the evening as needed for constipation    [provider]  simvastatin (ZOCOR) 20 MG tablet Take 20 mg by mouth at bedtime.    [provider]    Allergies    Patient has no known allergies.  Review of Systems   Review of Systems  Constitutional:        Per HPI, otherwise negative  HENT:         Per HPI, otherwise negative  Respiratory:         Per HPI, otherwise negative  Cardiovascular:        Per HPI, otherwise negative  Gastrointestinal:  Negative for vomiting.  Endocrine:       Negative aside from HPI  Genitourinary:        Neg aside from HPI   Musculoskeletal:        Per HPI, otherwise negative  Skin: Negative.   Neurological:  Positive for speech difficulty and weakness. Negative for syncope.   Physical Exam Updated Vital Signs BP 126/82   Pulse 77   Resp (!) 35   Ht 5\' 11"  (1.803 m)   Wt 122.1 kg    SpO2 95%   BMI 37.54 kg/m   Physical Exam Vitals and nursing note reviewed.  Constitutional:      General: She is not in acute distress.    Appearance: She is well-developed. She is obese.  HENT:     Head: Normocephalic and atraumatic.  Eyes:     Conjunctiva/sclera: Conjunctivae normal.  Cardiovascular:     Rate and Rhythm: Normal rate and regular rhythm.  Pulmonary:     Effort: Pulmonary effort is normal. No respiratory distress.     Breath sounds: Normal breath sounds. No stridor.  Abdominal:     General: There is no distension.  Musculoskeletal:     Comments: Patient resting with left leg flexion hip, knee,  tenderness palpation left leg without appreciable deformity, but with hesitancy move the leg secondary pain in this area.  Skin:    General: Skin is warm and dry.  Neurological:     Mental Status: She is alert and oriented to person, place, and time.     Cranial Nerves: No cranial nerve deficit.     Comments: Speech unremarkable, face unremarkable, atrophy, but    ED Results / Procedures / Treatments   Labs (all labs ordered are listed, but only abnormal results are displayed) Labs Reviewed  RESP PANEL BY RT-PCR (FLU A&B, COVID) ARPGX2  COMPREHENSIVE METABOLIC PANEL  BRAIN NATRIURETIC PEPTIDE  LACTIC ACID, PLASMA  LACTIC ACID, PLASMA  CBC WITH DIFFERENTIAL/PLATELET  PROTIME-INR  URINALYSIS, ROUTINE W REFLEX MICROSCOPIC    EKG None  Radiology CT Head Wo Contrast  Result Date: 08/26/2020 CLINICAL DATA:  Poly trauma EXAM: CT HEAD WITHOUT CONTRAST TECHNIQUE: Contiguous axial images were obtained from the base of the skull through the vertex without intravenous contrast. COMPARISON:  None. FINDINGS: Brain: No evidence of acute infarction, hemorrhage, hydrocephalus, extra-axial collection or mass lesion/mass effect. Vascular: No hyperdense vessel or unexpected calcification. Skull: Normal. Negative for fracture or focal lesion. Sinuses/Orbits: Mild polypoid mucosal  thickening of the maxillary sinuses. Other: None. IMPRESSION: No acute intracranial abnormality. Electronically Signed   By: Fidela Salisbury M.D.   On: 08/26/2020 12:14   DG Hip Unilat With Pelvis 2-3 Views Left  Result Date: 08/26/2020 CLINICAL DATA:  Fall with left hip pain EXAM: DG HIP (WITH OR WITHOUT PELVIS) 2-3V LEFT COMPARISON:  None. FINDINGS: Acute moderately displaced and angulated subtrochanteric fracture of the proximal left femur. Fracture involvement of the intertrochanteric region difficult to exclude. Moderate osteoarthritis of both hips. Extensive heterotopic ossification about the left hip. No dislocation. No additional fractures are identified. IMPRESSION: Acute moderately displaced and angulated subtrochanteric fracture of the proximal left femur. Electronically Signed   By: Davina Poke D.O.   On: 08/26/2020 12:05    Procedures Procedures   Medications Ordered in ED Medications - No data to display  ED Course  I have reviewed the triage vital signs and the nursing notes.  Pertinent labs & imaging results that were available during my care of the patient were reviewed by me and considered in my medical decision making (see chart for details).  Repeat exam the patient continues to have pain.  I discussed her case with our orthopedic colleagues, inpatient we have placed in Buck's traction.  Given her ongoing Eliquis use not a candidate for surgery today.  Case discussed with Dr. Alvan Dame.  Later able clinically the patient has seen sports medicine, Dr. Mardelle Matte previously and an attempt was made.  Their service should they want to assume care.   MDM Rules/Calculators/A&P Adult female presents in context of ongoing weakness, but after a fall resulting in left subtrochanteric fracture.  Patient is found to have COVID-positive status, labs otherwise reassuring.  X-ray consistent with fracture, as above, head CT performed due to fall, reassuring.  Patient admitted to internal  medicine with orthopedics following. Final Clinical Impression(s) / ED Diagnoses Final diagnoses:  Fall, initial encounter  Traumatic injury of head, initial encounter  Closed fracture of left hip, initial encounter [facility])    Rx / DC Orders ED Discharge Orders     None        Carmin Muskrat, MD 08/26/20 1553

## 2020-08-26 NOTE — ED Notes (Signed)
Placed pt on hospital bed

## 2020-08-26 NOTE — Progress Notes (Signed)
Orthopedic Tech Progress Note Patient Details:  Kim Orr 29-Nov-1940 [id number] Applied bucks traction to LLE. Musculoskeletal Traction Type of Traction: Bucks Skin Traction Traction Location: LLE Traction Weight: 10 lbs   Post Interventions Patient Tolerated: Laurena Spies D Matthew Cina 08/26/2020, 6:57 PM

## 2020-08-27 ENCOUNTER — Encounter (HOSPITAL_COMMUNITY)
Admission: EM | Disposition: A | Discharge status: Skilled Nursing Facility | Payer: Self-pay | Source: Home / Self Care | Attending: Internal Medicine

## 2020-08-27 ENCOUNTER — Encounter (HOSPITAL_COMMUNITY): Payer: Self-pay | Admitting: Internal Medicine

## 2020-08-27 ENCOUNTER — Inpatient Hospital Stay (HOSPITAL_COMMUNITY): Payer: Medicare HMO | Admitting: Certified Registered Nurse Anesthetist

## 2020-08-27 ENCOUNTER — Inpatient Hospital Stay (HOSPITAL_COMMUNITY): Payer: Medicare HMO

## 2020-08-27 DIAGNOSIS — E6609 Other obesity due to excess calories: Secondary | ICD-10-CM | POA: Diagnosis not present

## 2020-08-27 DIAGNOSIS — I482 Chronic atrial fibrillation, unspecified: Secondary | ICD-10-CM

## 2020-08-27 DIAGNOSIS — Z6837 Body mass index (BMI) 37.0-37.9, adult: Secondary | ICD-10-CM

## 2020-08-27 DIAGNOSIS — S72002A Fracture of unspecified part of neck of left femur, initial encounter for closed fracture: Secondary | ICD-10-CM | POA: Diagnosis not present

## 2020-08-27 HISTORY — PX: INTRAMEDULLARY (IM) NAIL INTERTROCHANTERIC: SHX5875

## 2020-08-27 LAB — C-REACTIVE PROTEIN: CRP: 6.2 mg/dL — ABNORMAL HIGH (ref <1.0)

## 2020-08-27 LAB — COMPREHENSIVE METABOLIC PANEL
ALT: 37 U/L (ref 0–44)
AST: 46 U/L — ABNORMAL HIGH (ref 15–41)
Albumin: 3 g/dL — ABNORMAL LOW (ref 3.5–5.0)
Alkaline Phosphatase: 65 U/L (ref 38–126)
Anion gap: 5 (ref 5–15)
BUN: 18 mg/dL (ref 8–23)
CO2: 30 mmol/L (ref 22–32)
Calcium: 9 mg/dL (ref 8.9–10.3)
Chloride: 103 mmol/L (ref 98–111)
Creatinine, Ser: 0.88 mg/dL (ref 0.44–1.00)
GFR, Estimated: >60 mL/min (ref >60)
Glucose, Bld: 133 mg/dL — ABNORMAL HIGH (ref 70–99)
Potassium: 5.2 mmol/L — ABNORMAL HIGH (ref 3.5–5.1)
Sodium: 138 mmol/L (ref 135–145)
Total Bilirubin: 0.8 mg/dL (ref 0.3–1.2)
Total Protein: 5.5 g/dL — ABNORMAL LOW (ref 6.5–8.1)

## 2020-08-27 LAB — CBC WITH DIFFERENTIAL/PLATELET
Abs Immature Granulocytes: 0.04 10*3/uL (ref 0.00–0.07)
Basophils Absolute: 0 10*3/uL (ref 0.0–0.1)
Basophils Relative: 0 %
Eosinophils Absolute: 0 10*3/uL (ref 0.0–0.5)
Eosinophils Relative: 0 %
HCT: 34.7 % — ABNORMAL LOW (ref 36.0–46.0)
Hemoglobin: 11.4 g/dL — ABNORMAL LOW (ref 12.0–15.0)
Immature Granulocytes: 1 %
Lymphocytes Relative: 11 %
Lymphs Abs: 0.7 10*3/uL (ref 0.7–4.0)
MCH: 33.7 pg (ref 26.0–34.0)
MCHC: 32.9 g/dL (ref 30.0–36.0)
MCV: 102.7 fL — ABNORMAL HIGH (ref 80.0–100.0)
Monocytes Absolute: 0.8 10*3/uL (ref 0.1–1.0)
Monocytes Relative: 11 %
Neutro Abs: 5.2 10*3/uL (ref 1.7–7.7)
Neutrophils Relative %: 77 %
Platelets: 160 10*3/uL (ref 150–400)
RBC: 3.38 MIL/uL — ABNORMAL LOW (ref 3.87–5.11)
RDW: 13.9 % (ref 11.5–15.5)
WBC: 6.7 10*3/uL (ref 4.0–10.5)
nRBC: 0 % (ref 0.0–0.2)

## 2020-08-27 LAB — FERRITIN: Ferritin: 252 ng/mL (ref 11–307)

## 2020-08-27 LAB — TYPE AND SCREEN
ABO/RH(D): O POS
Antibody Screen: NEGATIVE

## 2020-08-27 LAB — D-DIMER, QUANTITATIVE: D-Dimer, Quant: 1.82 ug/mL-FEU — ABNORMAL HIGH (ref 0.00–0.50)

## 2020-08-27 LAB — SURGICAL PCR SCREEN
MRSA, PCR: NEGATIVE
Staphylococcus aureus: NEGATIVE

## 2020-08-27 LAB — ABO/RH: ABO/RH(D): O POS

## 2020-08-27 LAB — GLUCOSE, CAPILLARY
Glucose-Capillary: 92 mg/dL (ref 70–99)
Glucose-Capillary: 89 mg/dL (ref 70–99)
Glucose-Capillary: 106 mg/dL — ABNORMAL HIGH (ref 70–99)

## 2020-08-27 SURGERY — FIXATION, FRACTURE, INTERTROCHANTERIC, WITH INTRAMEDULLARY ROD
Anesthesia: General | Site: Hip | Laterality: Left

## 2020-08-27 MED ORDER — ROCURONIUM BROMIDE 10 MG/ML (PF) SYRINGE
PREFILLED_SYRINGE | INTRAVENOUS | Status: DC | PRN
  Administered 2020-08-27: 20 mg via INTRAVENOUS
  Administered 2020-08-27: 50 mg via INTRAVENOUS
  Administered 2020-08-27: 30 mg via INTRAVENOUS

## 2020-08-27 MED ORDER — LIDOCAINE 2% (20 MG/ML) 5 ML SYRINGE
INTRAMUSCULAR | Status: DC | PRN
  Administered 2020-08-27: 80 mg via INTRAVENOUS

## 2020-08-27 MED ORDER — EPHEDRINE SULFATE-NACL 50-0.9 MG/10ML-% IV SOSY
PREFILLED_SYRINGE | INTRAVENOUS | Status: DC | PRN
  Administered 2020-08-27: 15 mg via INTRAVENOUS
  Administered 2020-08-27 (×2): 5 mg via INTRAVENOUS

## 2020-08-27 MED ORDER — FENTANYL CITRATE (PF) 250 MCG/5ML IJ SOLN
INTRAMUSCULAR | Status: AC
  Filled 2020-08-27: qty 5

## 2020-08-27 MED ORDER — 0.9 % SODIUM CHLORIDE (POUR BTL) OPTIME
TOPICAL | Status: DC | PRN
  Administered 2020-08-27: 1000 mL

## 2020-08-27 MED ORDER — FENTANYL CITRATE (PF) 100 MCG/2ML IJ SOLN: 25.0000 ug | INTRAMUSCULAR | Status: DC | PRN

## 2020-08-27 MED ORDER — ENSURE MAX PROTEIN PO LIQD: 11.0000 [oz_av] | Freq: Every day | ORAL | Status: DC

## 2020-08-27 MED ORDER — ACETAMINOPHEN 500 MG PO TABS
1000.0000 mg | ORAL_TABLET | Freq: Once | ORAL | Status: AC
  Administered 2020-08-27: 1000 mg via ORAL
  Filled 2020-08-27: qty 2

## 2020-08-27 MED ORDER — GLUCERNA SHAKE PO LIQD
237.0000 mL | Freq: Two times a day (BID) | ORAL | Status: DC
Start: 2020-08-28 — End: 2020-08-30

## 2020-08-27 MED ORDER — FENTANYL CITRATE (PF) 250 MCG/5ML IJ SOLN
INTRAMUSCULAR | Status: DC | PRN
  Administered 2020-08-27: 50 ug via INTRAVENOUS
  Administered 2020-08-27: 100 ug via INTRAVENOUS
  Administered 2020-08-27 (×2): 50 ug via INTRAVENOUS

## 2020-08-27 MED ORDER — CEFAZOLIN IN SODIUM CHLORIDE 3-0.9 GM/100ML-% IV SOLN
3.0000 g | INTRAVENOUS | Status: AC
  Administered 2020-08-27: 3 g via INTRAVENOUS

## 2020-08-27 MED ORDER — ONDANSETRON HCL 4 MG/2ML IJ SOLN
INTRAMUSCULAR | Status: DC | PRN
  Administered 2020-08-27: 4 mg via INTRAVENOUS

## 2020-08-27 MED ORDER — BUPIVACAINE HCL (PF) 0.5 % IJ SOLN
INTRAMUSCULAR | Status: AC
  Filled 2020-08-27: qty 30

## 2020-08-27 MED ORDER — PHENYLEPHRINE HCL-NACL 10-0.9 MG/250ML-% IV SOLN
INTRAVENOUS | Status: DC | PRN
  Administered 2020-08-27: 100 ug/min via INTRAVENOUS

## 2020-08-27 MED ORDER — DEXAMETHASONE SODIUM PHOSPHATE 10 MG/ML IJ SOLN
INTRAMUSCULAR | Status: DC | PRN
  Administered 2020-08-27: 10 mg via INTRAVENOUS

## 2020-08-27 MED ORDER — POVIDONE-IODINE 10 % EX SWAB: 2.0000 "application " | Freq: Once | CUTANEOUS | Status: DC

## 2020-08-27 MED ORDER — SUCCINYLCHOLINE CHLORIDE 200 MG/10ML IV SOSY
PREFILLED_SYRINGE | INTRAVENOUS | Status: DC | PRN
  Administered 2020-08-27: 120 mg via INTRAVENOUS

## 2020-08-27 MED ORDER — PHENYLEPHRINE HCL-NACL 20-0.9 MG/250ML-% IV SOLN
INTRAVENOUS | Status: DC | PRN
  Administered 2020-08-27: 150 ug/min via INTRAVENOUS

## 2020-08-27 MED ORDER — ADULT MULTIVITAMIN W/MINERALS CH
1.0000 | ORAL_TABLET | Freq: Every day | ORAL | Status: DC
  Administered 2020-08-28 – 2020-09-04 (×7): 1 via ORAL
  Filled 2020-08-27 (×7): qty 1

## 2020-08-27 MED ORDER — PHENYLEPHRINE HCL (PRESSORS) 10 MG/ML IV SOLN
INTRAVENOUS | Status: AC
  Filled 2020-08-27: qty 1

## 2020-08-27 MED ORDER — PHENYLEPHRINE 40 MCG/ML (10ML) SYRINGE FOR IV PUSH (FOR BLOOD PRESSURE SUPPORT)
PREFILLED_SYRINGE | INTRAVENOUS | Status: DC | PRN
  Administered 2020-08-27: 80 ug via INTRAVENOUS
  Administered 2020-08-27 (×2): 120 ug via INTRAVENOUS
  Administered 2020-08-27: 80 ug via INTRAVENOUS

## 2020-08-27 MED ORDER — SUGAMMADEX SODIUM 200 MG/2ML IV SOLN
INTRAVENOUS | Status: DC | PRN
  Administered 2020-08-27: 200 mg via INTRAVENOUS

## 2020-08-27 MED ORDER — CHLORHEXIDINE GLUCONATE 4 % EX LIQD
60.0000 mL | Freq: Once | CUTANEOUS | Status: AC
  Administered 2020-08-27: 4 via TOPICAL

## 2020-08-27 MED ORDER — ALBUMIN HUMAN 5 % IV SOLN: INTRAVENOUS | Status: DC | PRN

## 2020-08-27 MED ORDER — BUPIVACAINE HCL (PF) 0.5 % IJ SOLN
INTRAMUSCULAR | Status: DC | PRN
  Administered 2020-08-27: 30 mL

## 2020-08-27 MED ORDER — PHENYLEPHRINE 40 MCG/ML (10ML) SYRINGE FOR IV PUSH (FOR BLOOD PRESSURE SUPPORT)
PREFILLED_SYRINGE | INTRAVENOUS | Status: AC
  Filled 2020-08-27: qty 10

## 2020-08-27 MED ORDER — PROPOFOL 10 MG/ML IV BOLUS
INTRAVENOUS | Status: AC
  Filled 2020-08-27: qty 20

## 2020-08-27 MED ORDER — VASOPRESSIN 20 UNIT/ML IV SOLN
INTRAVENOUS | Status: DC | PRN
  Administered 2020-08-27: 1 [IU] via INTRAVENOUS

## 2020-08-27 MED ORDER — ETOMIDATE 2 MG/ML IV SOLN
INTRAVENOUS | Status: DC | PRN
  Administered 2020-08-27: 10 mg via INTRAVENOUS

## 2020-08-27 SURGICAL SUPPLY — 53 items
BAG COUNTER SPONGE SURGICOUNT (BAG) ×1 IMPLANT
BENZOIN TINCTURE PRP APPL 2/3 (GAUZE/BANDAGES/DRESSINGS) ×1 IMPLANT
BIT DRILL 4.3MMS DISTAL GRDTED (BIT) IMPLANT
BOOTCOVER CLEANROOM LRG (PROTECTIVE WEAR) ×2 IMPLANT
CLSR STERI-STRIP ANTIMIC 1/2X4 (GAUZE/BANDAGES/DRESSINGS) ×3 IMPLANT
COVER PERINEAL POST (MISCELLANEOUS) ×2 IMPLANT
COVER SURGICAL LIGHT HANDLE (MISCELLANEOUS) ×2 IMPLANT
DRAPE C-ARM 42X72 X-RAY (DRAPES) ×1 IMPLANT
DRAPE STERI IOBAN 125X83 (DRAPES) ×2 IMPLANT
DRESSING MEPILEX FLEX 4X4 (GAUZE/BANDAGES/DRESSINGS) IMPLANT
DRILL 4.3MMS DISTAL GRADUATED (BIT) ×2
DRSG MEPILEX BORDER 4X12 (GAUZE/BANDAGES/DRESSINGS) ×1 IMPLANT
DRSG MEPILEX BORDER 4X4 (GAUZE/BANDAGES/DRESSINGS) ×2 IMPLANT
DRSG MEPILEX BORDER 4X8 (GAUZE/BANDAGES/DRESSINGS) ×1 IMPLANT
DRSG MEPILEX FLEX 4X4 (GAUZE/BANDAGES/DRESSINGS) ×2
DURAPREP 26ML APPLICATOR (WOUND CARE) ×2 IMPLANT
ELECT CAUTERY BLADE 6.4 (BLADE) ×2 IMPLANT
ELECT REM PT RETURN 9FT ADLT (ELECTROSURGICAL) ×2
ELECTRODE REM PT RTRN 9FT ADLT (ELECTROSURGICAL) ×1 IMPLANT
EVACUATOR 1/8 PVC DRAIN (DRAIN) IMPLANT
FACESHIELD WRAPAROUND (MASK) IMPLANT
FACESHIELD WRAPAROUND OR TEAM (MASK) ×2 IMPLANT
GAUZE XEROFORM 5X9 LF (GAUZE/BANDAGES/DRESSINGS) ×1 IMPLANT
GLOVE SURG ENC MOIS LTX SZ7 (GLOVE) ×4 IMPLANT
GLOVE SURG ORTHO LTX SZ7.5 (GLOVE) ×4 IMPLANT
GLOVE SURG ORTHO LTX SZ8 (GLOVE) ×2 IMPLANT
GLOVE SURG POLYISO LF SZ7.5 (GLOVE) ×2 IMPLANT
GLOVE SURG POLYISO LF SZ8 (GLOVE) ×1 IMPLANT
GLOVE SURG UNDER POLY LF SZ7 (GLOVE) ×2 IMPLANT
GOWN STRL REUS W/ TWL LRG LVL3 (GOWN DISPOSABLE) ×2 IMPLANT
GOWN STRL REUS W/TWL LRG LVL3 (GOWN DISPOSABLE) ×4
GUIDEPIN 3.2X17.5 THRD DISP (PIN) ×3 IMPLANT
GUIDEWIRE BALL NOSE 100CM (WIRE) ×1 IMPLANT
KIT TURNOVER KIT B (KITS) ×2 IMPLANT
MANIFOLD NEPTUNE II (INSTRUMENTS) ×1 IMPLANT
NAIL HIP FRACT LT 130D 11X400 (Nail) ×1 IMPLANT
NDL HYPO 25GX1X1/2 BEV (NEEDLE) IMPLANT
NEEDLE HYPO 25GX1X1/2 BEV (NEEDLE) ×2 IMPLANT
NS IRRIG 1000ML POUR BTL (IV SOLUTION) ×2 IMPLANT
PACK GENERAL/GYN (CUSTOM PROCEDURE TRAY) ×2 IMPLANT
PAD ARMBOARD 7.5X6 YLW CONV (MISCELLANEOUS) ×4 IMPLANT
SCREW BONE CORTICAL 5.0X42 (Screw) ×1 IMPLANT
SCREW LAG HIP NAIL 10.5X95 (Screw) ×1 IMPLANT
SOL PREP POV-IOD 4OZ 10% (MISCELLANEOUS) ×1 IMPLANT
SUT VIC AB 0 CT1 27 (SUTURE) ×4
SUT VIC AB 0 CT1 27XBRD ANBCTR (SUTURE) ×1 IMPLANT
SUT VIC AB 2-0 CT1 27 (SUTURE) ×4
SUT VIC AB 2-0 CT1 TAPERPNT 27 (SUTURE) IMPLANT
SUT VIC AB 3-0 SH 8-18 (SUTURE) ×2 IMPLANT
SYR CONTROL 10ML LL (SYRINGE) ×1 IMPLANT
TOWEL GREEN STERILE (TOWEL DISPOSABLE) ×2 IMPLANT
TOWEL GREEN STERILE FF (TOWEL DISPOSABLE) ×2 IMPLANT
WATER STERILE IRR 1000ML POUR (IV SOLUTION) ×2 IMPLANT

## 2020-08-27 NOTE — Anesthesia Procedure Notes (Signed)
Procedure Name: Intubation Date/Time: 08/27/2020 6:41 PM Performed by: Dorthea Cove, CRNA Pre-anesthesia Checklist: Patient identified, Emergency Drugs available, Suction available and Patient being monitored Patient Re-evaluated:Patient Re-evaluated prior to induction Oxygen Delivery Method: Circle system utilized Preoxygenation: Pre-oxygenation with 100% oxygen Induction Type: Rapid sequence and Cricoid Pressure applied Laryngoscope Size: Mac, 3 and Glidescope Grade View: Grade I Tube type: Oral Tube size: 7.0 mm Number of attempts: 1 Airway Equipment and Method: Stylet, Oral airway and Video-laryngoscopy Placement Confirmation: ETT inserted through vocal cords under direct vision, positive ETCO2 and breath sounds checked- equal and bilateral Secured at: 22 cm Tube secured with: Tape Dental Injury: Teeth and Oropharynx as per pre-operative assessment

## 2020-08-27 NOTE — Progress Notes (Signed)
Initial Nutrition Assessment  DOCUMENTATION CODES:   Obesity unspecified  INTERVENTION:   -Once diet is advanced, add:   -MVI with minerals daily -Glucerna Shake po BID, each supplement provides 220 kcal and 10 grams of protein  -Ensure Max po daily, each supplement provides 150 kcal and 30 grams of protein.    NUTRITION DIAGNOSIS:   Increased nutrient needs related to post-op healing as evidenced by estimated needs.  GOAL:   Patient will meet greater than or equal to 90% of their needs  MONITOR:   PO intake, Supplement acceptance, Diet advancement, Labs, Weight trends, Skin, I & O's  REASON FOR ASSESSMENT:   Consult Assessment of nutrition requirement/status, Hip fracture protocol  ASSESSMENT:   80 year old female with HTN, HLD, obesity, hyper thyroidism, comes into the hospital with a fall.  This happened while walking to the bathroom, has been complained of weakness as well as a sore throat and coughing for the last few days.  In the ED she was found to have a hip fracture and she was also COVID-positive.  Orthopedic surgery consulted  Pt admitted with lt hip fracture and COVID-19 infection.   Reviewed I/O's: -600 ml x 24 hours  Pt sleeping soundly at time of visit and did not arouse to voice. She is currently NPO for potential OR today.   Reviewed wt hx; wt has been stable over the past 3 months.   Pt with increased nutritional needs due to COVID-19 and post-operative healing and would benefit from addition of oral nutrition supplements.   Medications reviewed and include vitamin C, decadron, colace, remdesivir, and zinc sulfate.   Labs reviewed: K: 5.2, CBGS: 92-176 (inpatient orders for glycemic control are 0-15 units inuslin aspart TID with meals).    NUTRITION - FOCUSED PHYSICAL EXAM:  Flowsheet Row Most Recent Value  Orbital Region No depletion  Upper Arm Region No depletion  Thoracic and Lumbar Region No depletion  Buccal Region No depletion  Temple  Region No depletion  Clavicle Bone Region No depletion  Clavicle and Acromion Bone Region No depletion  Scapular Bone Region No depletion  Dorsal Hand No depletion  Patellar Region No depletion  Anterior Thigh Region No depletion  Posterior Calf Region No depletion  Edema (RD Assessment) Severe  Hair Reviewed  Eyes Reviewed  Mouth Reviewed  Skin Reviewed  Nails Reviewed       Diet Order:   Diet Order             Diet NPO time specified  Diet effective midnight                   EDUCATION NEEDS:   No education needs have been identified at this time  Skin:  Skin Assessment: Reviewed RN Assessment  Last BM:  08/26/20  Height:   Ht Readings from Last 1 Encounters:  08/26/20 5\' 11"  (1.803 m)    Weight:   Wt Readings from Last 1 Encounters:  08/26/20 122.1 kg    Ideal Body Weight:  70.5 kg  BMI:  Body mass index is 37.54 kg/m.  Estimated Nutritional Needs:   Kcal:  2100-2300  Protein:  125-140 grams  Fluid:  > 2 L    Loistine Chance, RD, LDN, [place] Registered Dietitian II Certified Diabetes Care and Education Specialist Please refer to [facility] for RD and/or RD on-call/weekend/after hours pager

## 2020-08-27 NOTE — Progress Notes (Signed)
The risks benefits and alternatives were discussed with the patient including but not limited to the risks of nonoperative treatment, versus surgical intervention including infection, bleeding, nerve injury, malunion, nonunion, the need for revision surgery, hardware prominence, hardware failure, the need for hardware removal, blood clots, cardiopulmonary complications, morbidity, mortality, among others, and they were willing to proceed.    Also discussed because of her previous near auto-fusion of that hip, if I can't position her successfully on the table, we may need to postpone for complex trauma subspcialty care, but hopefully her hip will reposition successfully to allow IM nail placement.   Johnny Bridge, MD

## 2020-08-27 NOTE — Op Note (Signed)
DATE OF SURGERY:  08/27/2020  TIME: 9:20 PM  PATIENT NAME:  Kim Orr  AGE: 80 y.o.  PRE-OPERATIVE DIAGNOSIS:  left intertrochantric fracture  POST-OPERATIVE DIAGNOSIS:  SAME  PROCEDURE:  INTRAMEDULLARY (IM) NAIL INTERTROCHANTRIC  SURGEON:  Johnny Bridge  ASSISTANT:  Merlene Pulling, PA-C, present and scrubbed throughout the case, critical for assistance with exposure, retraction, instrumentation, and closure.  OPERATIVE IMPLANTS: Biomet Affixus size 400 x 11 femoral nail with a cephalomedullary lag screw for the femoral head, and a size 42 distal interlocking bolt.  UNIQUE ASPECTS OF THE CASE: The proximal segment was fused and was extremely difficult to mobilize, I had to leave her the distal segment up using a Cobb elevator and then clamp an open technique in order to achieve reduction of the fracture.  This was extremely difficult.  ESTIMATED BLOOD LOSS: 258ml  PREOPERATIVE INDICATIONS:  Kim Orr is a 80 y.o. year old who fell and suffered a hip fracture. She was brought into the ER and then admitted and optimized and then elected for surgical intervention.    The risks benefits and alternatives were discussed with the patient including but not limited to the risks of nonoperative treatment, versus surgical intervention including infection, bleeding, nerve injury, malunion, nonunion, hardware prominence, hardware failure, need for hardware removal, blood clots, cardiopulmonary complications, morbidity, mortality, among others, and they were willing to proceed.    OPERATIVE PROCEDURE:  The patient was brought to the operating room and placed in the supine position. Anesthesia was administered. She was placed on the fracture table.  Closed reduction was performed under C-arm guidance.  I was unable to satisfactorily reduce it.  Time out was then performed after sterile prep and drape. She received preoperative antibiotics.  I localized the fracture, and then opened the  lateral aspect of the femur, exposed the fracture somewhat, performed multiple different reduction maneuvers and was ultimately able to achieve reduction using a Cobb elevator levering through the fracture site and a large clamp to provide provisional fixation.  Incision was made proximal to the greater trochanter. A guidewire was placed in the appropriate position. Confirmation was made on AP and lateral views.  Placement of guidewire was extremely difficult because I kept engaging in the heterotopic ossification, and had no clinical feel for where the greater trochanter was located.  Once I had appropriate position I opened the femur with a reamer, passed the long guidewire, reamed up to a size 12, measured the length of the nail, and the above-named nail was opened. I opened the proximal femur with a reamer.   Once the nail was completely seated, I placed a guidepin into the femoral head into the center center position. I measured the length, and then reamed the lateral cortex and up into the head. I then placed the cephalomedullary screw. Anatomic fixation achieved. Bone quality was reasonably stout in the femoral head.  I then secured the proximal interlocking bolt, and locked the nail distally using the jig.  I took final C-arm pictures AP and lateral.   Anatomic reconstruction was achieved, and the wounds were irrigated copiously and closed with Vicryl followed by Steri-Strips and sterile gauze for the skin. The patient was awakened and returned to PACU in stable and satisfactory condition. There were no complications and the patient tolerated the procedure well.  She will be weightbearing as tolerated, and will be on Lovenox  for a period of four weeks after discharge.   Kim Orr, M.D.

## 2020-08-27 NOTE — Progress Notes (Signed)
Pt's ring placed in cup and in pt's sock in cabinet per her request.

## 2020-08-27 NOTE — Anesthesia Preprocedure Evaluation (Addendum)
Anesthesia Evaluation  Patient identified by MRN, date of birth, ID band Patient awake    Reviewed: Allergy & Precautions, Patient's Chart, lab work & pertinent test results  Airway Mallampati: II  TM Distance: >3 FB     Dental   Pulmonary neg pulmonary ROS,    breath sounds clear to auscultation       Cardiovascular hypertension, + dysrhythmias Atrial Fibrillation  Rhythm:Regular Rate:Normal     Neuro/Psych negative neurological ROS  negative psych ROS   GI/Hepatic negative GI ROS, Neg liver ROS,   Endo/Other  Hyperthyroidism   Renal/GU negative Renal ROS     Musculoskeletal  (+) Arthritis ,   Abdominal   Peds  Hematology negative hematology ROS (+)   Anesthesia Other Findings - HLD  Reproductive/Obstetrics                            Anesthesia Physical Anesthesia Plan  ASA: 3  Anesthesia Plan: General   Post-op Pain Management:    Induction: Intravenous  PONV Risk Score and Plan: 4 or greater and Ondansetron and Treatment may vary due to age or medical condition  Airway Management Planned: Oral ETT  Additional Equipment: None  Intra-op Plan:   Post-operative Plan: Extubation in OR  Informed Consent: I have reviewed the patients History and Physical, chart, labs and discussed the procedure including the risks, benefits and alternatives for the proposed anesthesia with the patient or authorized representative who has indicated his/her understanding and acceptance.     Dental advisory given  Plan Discussed with: CRNA, Anesthesiologist and Surgeon  Anesthesia Plan Comments:        Anesthesia Quick Evaluation

## 2020-08-27 NOTE — Transfer of Care (Signed)
Immediate Anesthesia Transfer of Care Note  Patient: Kim Orr  Procedure(s) Performed: INTRAMEDULLARY (IM) NAIL INTERTROCHANTRIC (Left)  Patient Location: PACU  Anesthesia Type:General  Level of Consciousness: awake, alert  and confused  Airway & Oxygen Therapy: Patient connected to nasal cannula oxygen  Post-op Assessment: Report given to RN and Post -op Vital signs reviewed and stable  Post vital signs: Reviewed and stable  Last Vitals:  Vitals Value Taken Time  BP 117/92 08/27/20 2222  Temp    Pulse 81 08/27/20 2228  Resp 19 08/27/20 2228  SpO2 98 % 08/27/20 2228  Vitals shown include unvalidated device data.  Last Pain:  Vitals:   08/27/20 1500  TempSrc: Oral  PainSc:       Patients Stated Pain Goal: 3 (18/86/77 3736)  Complications: No notable events documented.

## 2020-08-27 NOTE — Consult Note (Addendum)
ORTHOPAEDIC CONSULTATION  REQUESTING PHYSICIAN: Caren Griffins, MD  Chief Complaint: Left hip pain after fall  HPI: Kim Orr is a 80 y.o. female with history of A fib on Eliquis, HTN, hyperlipidemia, obesity, hyperthyroidism who complains of left hip pain after a fall. She normally walks with a rolling walker at baseline but has a wheelchair which she uses at times around the house. She was walking to the bathroom yesterday and fell onto her left hip and had immediate severe pain. She is complaining of sore throat and coughing and was found to be COVID positive. This morning left hip pain is moderate, worse with movement and better with pain medication. Denies chest pain, shortness of breath, abdominal pain, nausea, or vomiting.   Past Medical History:  Diagnosis Date   Atrial fibrillation, chronic ([place]) 08/26/2020   DNR (do not resuscitate) 08/26/2020   Essential hypertension 07/01/2017   Hemorrhoids 03/16/2017   History of nephrolithiasis 04/14/2016   Hyperlipidemia 07/01/2017   Hyperthyroidism 05/03/2020   Multinodular goiter 06/01/2018   Formatting of this note is different from the original. 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  Risk of malignancy 5-15%     Osteoarthritis 07/01/2017   Sciatica 05/03/2020   Status post cholecystectomy 03/16/2017   Formatting of this note might be different from the original. Robotic cholecystectomy performed 1/30 @ [place] of this note might be different from the original. Robotic cholecystectomy performed 1/30 @ [facility]   Past Surgical History:  Procedure Laterality Date   CARDIOVERSION N/A 05/08/2020   Procedure: CARDIOVERSION;  Surgeon: Sanda Klein, MD;  Location: [facility];  Service: Cardiovascular;  Laterality: N/A;   TEE WITHOUT CARDIOVERSION N/A 05/08/2020   Procedure: TRANSESOPHAGEAL ECHOCARDIOGRAM (TEE);  Surgeon: Sanda Klein, MD;  Location: [facility];  Service:  Cardiovascular;  Laterality: N/A;   Social History   Socioeconomic History   Marital status: Married    Spouse name: Not on file   Number of children: Not on file   Years of education: Not on file   Highest education level: Not on file  Occupational History   Not on file  Tobacco Use   Smoking status: Never   Smokeless tobacco: Never  Substance and Sexual Activity   Alcohol use: Not on file   Drug use: Not on file   Sexual activity: Not on file  Other Topics Concern   Not on file  Social History Narrative   Not on file   Social Determinants of Health   Financial Resource Strain: Not on file  Food Insecurity: Not on file  Transportation Needs: Not on file  Physical Activity: Not on file  Stress: Not on file  Social Connections: Not on file   Family History  Problem Relation Age of Onset   Breast cancer Mother    Heart disease Father    No Known Allergies   Positive ROS: All other systems have been reviewed and were otherwise negative with the exception of those mentioned in the HPI and as above.  Physical Exam: General: Alert, sitting up in bed, no acute distress Cardiovascular: 2+ pedal edema Respiratory: no use of accessory musculature, nasal canula O2 in place GI: No organomegaly, abdomen is soft and non-tender Skin: No lesions in the area of chief complaint Neurologic: Sensation intact distally Psychiatric: Patient is competent for consent with normal mood and affect Lymphatic: No axillary or cervical lymphadenopathy  MUSCULOSKELETAL: LLE internally rotated and shortened. 2+ pedal edema.  In traction. Difficult to feel DP and PT pulses, but capillary refill is intact at bilateral feet. Dorsiflexion and plantarflexion intact. Moderate TTP over proximal left femur.  Assessment: Left hip fracture in patient found to be COVID + with coexisting morbid obesity, A fib on Eliquis, HTN, hyperlipidemia, hyperthyroidism   Plan: L hip IM nail with Dr Mardelle Matte this  afternoon. Risks, benefits and alternatives of a left hip IM nail was discussed with the patient and she would like to move forward with surgery.  Continue NPO status, holding Eliquis A fib, HTN, hyperlipidemia, hyperthyroidism - per primary  Ventura Bruns, PA-C   08/27/2020 8:00 AM  Addendum: Risks, benefits, and alternatives of left hip IM nail also discussed with daughter [name] who agrees with moving forward with surgery. [name] clarified that patient walks around that house, but anytime outside of house patient is in a wheelchair. She struggles to stand up straight at baseline, she often drags her left foot when walking at [place].

## 2020-08-27 NOTE — Progress Notes (Signed)
PROGRESS NOTE  Kim Orr ZDG:[id number] DOB: Jul 01, 1940 DOA: 08/26/2020 PCP: Lajean Manes, MD   LOS: 1 day   Brief Narrative / Interim history: 80 year old female with HTN, HLD, obesity, hyper thyroidism, comes into the hospital with a fall.  This happened while walking to the bathroom, has been complained of weakness as well as a sore throat and coughing for the last few days.  In the ED she was found to have a hip fracture and she was also COVID-positive.  Orthopedic surgery consulted  Subjective / 24h Interval events: She is doing well this morning, denies any significant shortness of breath.  Tells me that she is vaccinated for COVID.  No abdominal pain, no nausea or vomiting.  Assessment & Plan: Principal Problem Hip fracture-orthopedic surgery consulted, appreciate input.  This is likely in setting of weakness associated with COVID infection.  DVT prophylaxis per orthopedic surgery  Active Problems Acute hypoxic respiratory failure due to COVID-19 infection-she was found to be hypoxic requiring supplemental oxygen in the ED therefore she was started on Remdesivir along with steroids.  Continue, she is stable and satting 99% on 3 L.  Wean off oxygen as tolerated  COVID-19 Labs  Recent Labs    08/26/20 1423 08/27/20 0335  DDIMER 1.03* 1.82*  FERRITIN 334* 252  LDH 165  --   CRP 2.4* 6.2*    Lab Results  Component Value Date   SARSCOV2NAA POSITIVE (A) 08/26/2020   SARSCOV2NAA NEGATIVE 05/11/2020   [place] NEGATIVE 05/03/2020   Paroxysmal A. fib-recently diagnosed, Eliquis on hold in the setting of surgery.  Continue metoprolol  Hyperthyroidism-continue methimazole  Essential hypertension-continue Toprol  Hyperlipidemia-continue statin  Obesity, class II-BMI 37, she would benefit from weight loss  Scheduled Meds:  vitamin C  500 mg Oral Daily   dexamethasone (DECADRON) injection  10 mg Intravenous Q24H   docusate sodium  100 mg Oral BID   gabapentin   300 mg Oral Daily   gabapentin  600 mg Oral QHS   insulin aspart  0-15 Units Subcutaneous TID WC   melatonin  10 mg Oral QHS   methimazole  5 mg Oral Daily   metoprolol succinate  100 mg Oral Daily   simvastatin  20 mg Oral QHS   zinc sulfate  220 mg Oral Daily   Continuous Infusions:  lactated ringers 75 mL/hr at 08/26/20 2324   methocarbamol (ROBAXIN) IV     remdesivir 100 mg in NS 100 mL 100 mg (08/27/20 0858)   PRN Meds:.albuterol, bisacodyl, chlorpheniramine-HYDROcodone, guaiFENesin-dextromethorphan, HYDROcodone-acetaminophen, methocarbamol **OR** methocarbamol (ROBAXIN) IV, morphine injection, polyethylene glycol  Diet Orders (From admission, onward)     Start     Ordered   08/27/20 0001  Diet NPO time specified  Diet effective midnight        08/26/20 2151            DVT prophylaxis: SCDs Start: 08/26/20 1421     Code Status: DNR  Family Communication: no family at bedside   Status is: Inpatient  Remains inpatient appropriate because:Inpatient level of care appropriate due to severity of illness  Dispo: The patient is from: Home              Anticipated d/c is to: SNF              Patient currently is not medically stable to d/c.   Difficult to place patient No   Level of care: Med-Surg  Consultants:  Orthopedic surgery  Procedures:  none  Microbiology  none  Antimicrobials: none    Objective: Vitals:   08/26/20 1623 08/26/20 1934 08/27/20 0654 08/27/20 0801  BP: (!) 107/56 (!) 115/49 (!) 113/57 (!) 104/51  Pulse: 77 67 69 73  Resp: 16 16 16 18   Temp: (!) 97.3 F (36.3 C) 97.8 F (36.6 C) 98.2 F (36.8 C) 97.8 F (36.6 C)  TempSrc: Oral Oral Oral Oral  SpO2: 94% 98% 100% 94%  Weight:      Height:        Intake/Output Summary (Last 24 hours) at 08/27/2020 0951 Last data filed at 08/27/2020 0700 Gross per 24 hour  Intake --  Output 600 ml  Net -600 ml   Filed Weights   08/26/20 1112  Weight: 122.1 kg     Examination:  Constitutional: NAD Eyes: no scleral icterus ENMT: Mucous membranes are moist.  Neck: normal, supple Respiratory: clear to auscultation bilaterally, no wheezing, no crackles. Normal respiratory effort. No accessory muscle use.  Cardiovascular: Regular rate and rhythm, no murmurs / rubs / gallops. No LE edema.  Abdomen: non distended, no tenderness. Bowel sounds positive.  Musculoskeletal: no clubbing / cyanosis.  Skin: no rashes Neurologic: CN 2-12 grossly intact. Strength 5/5 in all 4.    Data Reviewed: I have independently reviewed following labs and imaging studies   CBC: Recent Labs  Lab 08/26/20 1103 08/27/20 0335  WBC 7.5 6.7  NEUTROABS 6.0 5.2  HGB 12.5 11.4*  HCT 39.6 34.7*  MCV 106.2* 102.7*  PLT 150 741   Basic Metabolic Panel: Recent Labs  Lab 08/26/20 1103 08/27/20 0335  NA 137 138  K 4.4 5.2*  CL 103 103  CO2 26 30  GLUCOSE 93 133*  BUN 16 18  CREATININE 0.87 0.88  CALCIUM 9.0 9.0   Liver Function Tests: Recent Labs  Lab 08/26/20 1103 08/27/20 0335  AST 22 46*  ALT 14 37  ALKPHOS 48 65  BILITOT 0.7 0.8  PROT 5.9* 5.5*  ALBUMIN 3.3* 3.0*   Coagulation Profile: Recent Labs  Lab 08/26/20 1103  INR 1.3*   HbA1C: No results for input(s): HGBA1C in the last 72 hours. CBG: Recent Labs  Lab 08/26/20 1804 08/26/20 2201 08/27/20 0706  GLUCAP 135* 176* 92    Recent Results (from the past 240 hour(s))  Resp Panel by RT-PCR (Flu A&B, Covid) Nasopharyngeal Swab     Status: Abnormal   Collection Time: 08/26/20 12:35 PM   Specimen: Nasopharyngeal Swab; Nasopharyngeal(NP) swabs in vial transport medium  Result Value Ref Range Status   SARS Coronavirus 2 by RT PCR POSITIVE (A) NEGATIVE Final    Comment: RESULT CALLED TO, READ BACK BY AND VERIFIED WITH: RN E.BANKS ON [id number] AT 1339 BY E.PARRISH (NOTE) SARS-CoV-2 target nucleic acids are DETECTED.  The SARS-CoV-2 RNA is generally detectable in upper  respiratory specimens during the acute phase of infection. Positive results are indicative of the presence of the identified virus, but do not rule out bacterial infection or co-infection with other pathogens not detected by the test. Clinical correlation with patient history and other diagnostic information is necessary to determine patient infection status. The expected result is Negative.  Fact Sheet for Patients: EntrepreneurPulse.com.au  Fact Sheet for Healthcare Providers: IncredibleEmployment.be  This test is not yet approved or cleared by the [place] FDA and  has been authorized for detection and/or diagnosis of SARS-CoV-2 by FDA under an Emergency Use Authorization (EUA).  This EUA will remain in effect (meaning this t est can be  used) for the duration of  the COVID-19 declaration under Section 564(b)(1) of the Act, 21 U.S.C. section 360bbb-3(b)(1), unless the authorization is terminated or revoked sooner.     Influenza A by PCR NEGATIVE NEGATIVE Final   Influenza B by PCR NEGATIVE NEGATIVE Final    Comment: (NOTE) The Xpert Xpress SARS-CoV-2/FLU/RSV plus assay is intended as an aid in the diagnosis of influenza from Nasopharyngeal swab specimens and should not be used as a sole basis for treatment. Nasal washings and aspirates are unacceptable for Xpert Xpress SARS-CoV-2/FLU/RSV testing.  Fact Sheet for Patients: EntrepreneurPulse.com.au  Fact Sheet for Healthcare Providers: IncredibleEmployment.be  This test is not yet approved or cleared by the [place] FDA and has been authorized for detection and/or diagnosis of SARS-CoV-2 by FDA under an Emergency Use Authorization (EUA). This EUA will remain in effect (meaning this test can be used) for the duration of the COVID-19 declaration under Section 564(b)(1) of the Act, 21 U.S.C. section 360bbb-3(b)(1), unless the authorization is  terminated or revoked.  Performed at [facility], Standard [address] [place], [place] [address]      Radiology Studies: CT Head Wo Contrast  Result Date: 08/26/2020 CLINICAL DATA:  Poly trauma EXAM: CT HEAD WITHOUT CONTRAST TECHNIQUE: Contiguous axial images were obtained from the base of the skull through the vertex without intravenous contrast. COMPARISON:  None. FINDINGS: Brain: No evidence of acute infarction, hemorrhage, hydrocephalus, extra-axial collection or mass lesion/mass effect. Vascular: No hyperdense vessel or unexpected calcification. Skull: Normal. Negative for fracture or focal lesion. Sinuses/Orbits: Mild polypoid mucosal thickening of the maxillary sinuses. Other: None. IMPRESSION: No acute intracranial abnormality. Electronically Signed   By: Fidela Salisbury M.D.   On: 08/26/2020 12:14   DG CHEST PORT 1 VIEW  Result Date: 08/26/2020 CLINICAL DATA:  Left hip fracture, COVID-19 positive EXAM: PORTABLE CHEST 1 VIEW COMPARISON:  05/08/2020 FINDINGS: Single frontal view of the chest demonstrates an unremarkable cardiac silhouette. Stable ectasia of the thoracic aorta. No airspace disease, effusion, or pneumothorax. Mild chronic scarring at the lung bases. No acute bony abnormalities. IMPRESSION: 1. Stable chest, no acute process. Electronically Signed   By: Randa Ngo M.D.   On: 08/26/2020 19:34   DG Hip Unilat With Pelvis 2-3 Views Left  Result Date: 08/26/2020 CLINICAL DATA:  Fall with left hip pain EXAM: DG HIP (WITH OR WITHOUT PELVIS) 2-3V LEFT COMPARISON:  None. FINDINGS: Acute moderately displaced and angulated subtrochanteric fracture of the proximal left femur. Fracture involvement of the intertrochanteric region difficult to exclude. Moderate osteoarthritis of both hips. Extensive heterotopic ossification about the left hip. No dislocation. No additional fractures are identified. IMPRESSION: Acute moderately displaced and angulated subtrochanteric fracture of  the proximal left femur. Electronically Signed   By: Davina Poke D.O.   On: 08/26/2020 12:05     Marzetta Board, MD, PhD [facility]  Between 7 am - 7 pm I am available, please contact me via Amion (for emergencies) or Securechat (non urgent messages)  Between 7 pm - 7 am I am not available, please contact night coverage MD/APP via Amion

## 2020-08-27 NOTE — TOC Progression Note (Addendum)
Transition of Care [facility]) - Progression Note    Patient Details  Name: Kim Orr MRN: [id number] Date of Birth: [birth year]  Transition of Care [facility]) CM/SW Contact  Milinda Antis, [place] Phone Number: 08/27/2020, 11:54 AM  Clinical Narrative:     TOC following patient for any d/c planning needs once medically stable.  Pending PT/OT assessments and recommendations.   14:24-  CSW received a call from [place] at [facility].  The patient can return for Skilled Nursing at the facility when patient is medically ready.    Lind Covert, MSW, LCSWA       Expected Discharge Plan and Services                                                 Social Determinants of Health (SDOH) Interventions    Readmission Risk Interventions No flowsheet data found.

## 2020-08-27 NOTE — Progress Notes (Signed)
[place] for Resuming Home DOAC on AM of POD 1 if Hgb =/>8 and Pt Not Requiring Blood Transfusion (Apixaban) Indication: atrial fibrillation  No Known Allergies  Patient Measurements: Height: 5\' 11"  (180.3 cm) Weight: 122.1 kg (269 lb 2.9 oz) IBW/kg (Calculated) : 70.8   Vital Signs: Temp: 97.8 F (36.6 C) (07/18 1500) Temp Source: Oral (07/18 1500) BP: 113/58 (07/18 1500) Pulse Rate: 75 (07/18 1500)  Labs: Recent Labs    08/26/20 1103 08/27/20 0335  HGB 12.5 11.4*  HCT 39.6 34.7*  PLT 150 160  LABPROT 15.9*  --   INR 1.3*  --   CREATININE 0.87 0.88    Estimated Creatinine Clearance: 73.5 mL/min (by C-G formula based on SCr of 0.88 mg/dL).   Medical History: Past Medical History:  Diagnosis Date   Atrial fibrillation, chronic ([place]) 08/26/2020   DNR (do not resuscitate) 08/26/2020   Essential hypertension 07/01/2017   Hemorrhoids 03/16/2017   History of nephrolithiasis 04/14/2016   Hyperlipidemia 07/01/2017   Hyperthyroidism 05/03/2020   Multinodular goiter 06/01/2018   Formatting of this note is different from the original. 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  Risk of malignancy 5-15%     Osteoarthritis 07/01/2017   Sciatica 05/03/2020   Status post cholecystectomy 03/16/2017   Formatting of this note might be different from the original. Robotic cholecystectomy performed 1/30 @ [place] of this note might be different from the original. Robotic cholecystectomy performed 1/30 @ [facility]    Assessment: 80 yr old female admitted 08/26/20 after a fall, which resulted in hip fracture. Pt is S/P orthopedic surgery this afternoon. Pt has atrial fibrillation, for which she was taking apixaban 5 mg po BID PTA. Pharmacy is consulted to resume home DOAC on AM of POD 1 if Hgb =/>8 and pt not requiring blood transfusion.  H/H 11.4/34.7, plt 160; wt 122.1 kg, Scr 0.88  Goal of Therapy:   Prevention of stroke secondary to atrial fibrillation Monitor platelets by anticoagulation protocol: Yes   Plan:  Resume home apixaban 5 mg PO BID tomorrow morning (POD 1) if Hgb =/>8 and pt not requiring blood transfusion Monitor CBC Monitor for bleeding  Gillermina Hu, PharmD, BCPS, [facility] Clinical Pharmacist 08/27/2020,10:23 PM

## 2020-08-27 NOTE — Anesthesia Postprocedure Evaluation (Signed)
Anesthesia Post Note  Patient: Kim Orr  Procedure(s) Performed: INTRAMEDULLARY (IM) NAIL INTERTROCHANTRIC (Left)     Patient location during evaluation: PACU Anesthesia Type: General Level of consciousness: awake Pain management: pain level controlled Vital Signs Assessment: post-procedure vital signs reviewed and stable Respiratory status: spontaneous breathing Cardiovascular status: stable Postop Assessment: no apparent nausea or vomiting Anesthetic complications: no   No notable events documented.  Last Vitals:  Vitals:   08/27/20 2240 08/27/20 2251  BP: (!) 109/57 (!) 106/51  Pulse: 78   Resp: 16   Temp:  36.7 C  SpO2: 99%     Last Pain:  Vitals:   08/27/20 2251  TempSrc:   PainSc: 0-No pain    LLE Motor Response: Purposeful movement (08/27/20 2251) LLE Sensation: Full sensation (08/27/20 2251)          Geddy [place]

## 2020-08-28 DIAGNOSIS — U071 COVID-19: Secondary | ICD-10-CM

## 2020-08-28 DIAGNOSIS — J9601 Acute respiratory failure with hypoxia: Secondary | ICD-10-CM | POA: Diagnosis not present

## 2020-08-28 DIAGNOSIS — S7222XA Displaced subtrochanteric fracture of left femur, initial encounter for closed fracture: Principal | ICD-10-CM

## 2020-08-28 LAB — CBC WITH DIFFERENTIAL/PLATELET
Abs Immature Granulocytes: 0.05 10*3/uL (ref 0.00–0.07)
Basophils Absolute: 0 10*3/uL (ref 0.0–0.1)
Basophils Relative: 0 %
Eosinophils Absolute: 0 10*3/uL (ref 0.0–0.5)
Eosinophils Relative: 0 %
HCT: 27.5 % — ABNORMAL LOW (ref 36.0–46.0)
Hemoglobin: 8.8 g/dL — ABNORMAL LOW (ref 12.0–15.0)
Immature Granulocytes: 1 %
Lymphocytes Relative: 8 %
Lymphs Abs: 0.8 10*3/uL (ref 0.7–4.0)
MCH: 33.3 pg (ref 26.0–34.0)
MCHC: 32 g/dL (ref 30.0–36.0)
MCV: 104.2 fL — ABNORMAL HIGH (ref 80.0–100.0)
Monocytes Absolute: 0.8 10*3/uL (ref 0.1–1.0)
Monocytes Relative: 7 %
Neutro Abs: 8.7 10*3/uL — ABNORMAL HIGH (ref 1.7–7.7)
Neutrophils Relative %: 84 %
Platelets: 153 10*3/uL (ref 150–400)
RBC: 2.64 MIL/uL — ABNORMAL LOW (ref 3.87–5.11)
RDW: 13.7 % (ref 11.5–15.5)
WBC: 10.3 10*3/uL (ref 4.0–10.5)
nRBC: 0 % (ref 0.0–0.2)

## 2020-08-28 LAB — COMPREHENSIVE METABOLIC PANEL
ALT: 34 U/L (ref 0–44)
AST: 44 U/L — ABNORMAL HIGH (ref 15–41)
Albumin: 3 g/dL — ABNORMAL LOW (ref 3.5–5.0)
Alkaline Phosphatase: 60 U/L (ref 38–126)
Anion gap: 5 (ref 5–15)
BUN: 13 mg/dL (ref 8–23)
CO2: 29 mmol/L (ref 22–32)
Calcium: 8.6 mg/dL — ABNORMAL LOW (ref 8.9–10.3)
Chloride: 104 mmol/L (ref 98–111)
Creatinine, Ser: 0.7 mg/dL (ref 0.44–1.00)
GFR, Estimated: >60 mL/min (ref >60)
Glucose, Bld: 132 mg/dL — ABNORMAL HIGH (ref 70–99)
Potassium: 5.1 mmol/L (ref 3.5–5.1)
Sodium: 138 mmol/L (ref 135–145)
Total Bilirubin: 0.6 mg/dL (ref 0.3–1.2)
Total Protein: 4.8 g/dL — ABNORMAL LOW (ref 6.5–8.1)

## 2020-08-28 LAB — GLUCOSE, CAPILLARY
Glucose-Capillary: 108 mg/dL — ABNORMAL HIGH (ref 70–99)
Glucose-Capillary: 120 mg/dL — ABNORMAL HIGH (ref 70–99)
Glucose-Capillary: 165 mg/dL — ABNORMAL HIGH (ref 70–99)
Glucose-Capillary: 95 mg/dL (ref 70–99)

## 2020-08-28 LAB — FERRITIN: Ferritin: 312 ng/mL — ABNORMAL HIGH (ref 11–307)

## 2020-08-28 LAB — D-DIMER, QUANTITATIVE: D-Dimer, Quant: 2.67 ug/mL-FEU — ABNORMAL HIGH (ref 0.00–0.50)

## 2020-08-28 LAB — C-REACTIVE PROTEIN: CRP: 4.8 mg/dL — ABNORMAL HIGH (ref <1.0)

## 2020-08-28 MED ORDER — FERROUS SULFATE 325 (65 FE) MG PO TABS
325.0000 mg | ORAL_TABLET | Freq: Three times a day (TID) | ORAL | Status: DC
  Administered 2020-08-28 – 2020-08-30 (×7): 325 mg via ORAL
  Filled 2020-08-28 (×7): qty 1

## 2020-08-28 MED ORDER — MORPHINE SULFATE (PF) 2 MG/ML IV SOLN
0.5000 mg | INTRAVENOUS | Status: DC | PRN
  Administered 2020-08-28 (×2): 1 mg via INTRAVENOUS
  Filled 2020-08-28: qty 1

## 2020-08-28 MED ORDER — GABAPENTIN 300 MG PO CAPS
300.0000 mg | ORAL_CAPSULE | Freq: Every day | ORAL | Status: DC
  Administered 2020-08-28 – 2020-09-04 (×7): 300 mg via ORAL
  Filled 2020-08-28 (×7): qty 1

## 2020-08-28 MED ORDER — TRANEXAMIC ACID-NACL 1000-0.7 MG/100ML-% IV SOLN
1000.0000 mg | Freq: Once | INTRAVENOUS | Status: AC
  Administered 2020-08-28: 1000 mg via INTRAVENOUS
  Filled 2020-08-28: qty 100

## 2020-08-28 MED ORDER — APIXABAN 5 MG PO TABS: 5.0000 mg | ORAL_TABLET | Freq: Two times a day (BID) | ORAL | Status: DC

## 2020-08-28 MED ORDER — PHENOL 1.4 % MT LIQD: 1.0000 | OROMUCOSAL | Status: DC | PRN

## 2020-08-28 MED ORDER — OXYCODONE HCL 5 MG PO TABS
5.0000 mg | ORAL_TABLET | ORAL | Status: DC | PRN
  Administered 2020-08-29 – 2020-08-30 (×3): 5 mg via ORAL
  Filled 2020-08-28 (×3): qty 1

## 2020-08-28 MED ORDER — ACETAMINOPHEN 500 MG PO TABS
500.0000 mg | ORAL_TABLET | Freq: Four times a day (QID) | ORAL | Status: AC
  Administered 2020-08-28 (×3): 500 mg via ORAL
  Filled 2020-08-28 (×2): qty 1

## 2020-08-28 MED ORDER — SENNA 8.6 MG PO TABS
1.0000 | ORAL_TABLET | Freq: Two times a day (BID) | ORAL | Status: DC
  Administered 2020-08-28 – 2020-08-30 (×5): 8.6 mg via ORAL
  Filled 2020-08-28 (×5): qty 1

## 2020-08-28 MED ORDER — ALUM & MAG HYDROXIDE-SIMETH 200-200-20 MG/5ML PO SUSP: 30.0000 mL | ORAL | Status: DC | PRN

## 2020-08-28 MED ORDER — ACETAMINOPHEN 325 MG PO TABS: 325.0000 mg | ORAL_TABLET | Freq: Four times a day (QID) | ORAL | Status: DC | PRN

## 2020-08-28 MED ORDER — ONDANSETRON HCL 4 MG PO TABS: 4.0000 mg | ORAL_TABLET | Freq: Four times a day (QID) | ORAL | Status: DC | PRN

## 2020-08-28 MED ORDER — ONDANSETRON HCL 4 MG/2ML IJ SOLN: 4.0000 mg | Freq: Four times a day (QID) | INTRAMUSCULAR | Status: DC | PRN

## 2020-08-28 MED ORDER — MENTHOL 3 MG MT LOZG
1.0000 | LOZENGE | OROMUCOSAL | Status: DC | PRN
  Filled 2020-08-28: qty 9

## 2020-08-28 MED ORDER — CEFAZOLIN SODIUM-DEXTROSE 2-4 GM/100ML-% IV SOLN
2.0000 g | Freq: Four times a day (QID) | INTRAVENOUS | Status: AC
  Administered 2020-08-28 (×2): 2 g via INTRAVENOUS
  Filled 2020-08-28 (×2): qty 100

## 2020-08-28 MED ORDER — METHOCARBAMOL 1000 MG/10ML IJ SOLN
500.0000 mg | Freq: Four times a day (QID) | INTRAMUSCULAR | Status: DC | PRN
  Filled 2020-08-28: qty 5

## 2020-08-28 MED ORDER — MORPHINE SULFATE (PF) 2 MG/ML IV SOLN
2.0000 mg | INTRAVENOUS | Status: DC | PRN
  Administered 2020-08-28: 2 mg via INTRAVENOUS
  Filled 2020-08-28: qty 1

## 2020-08-28 MED ORDER — ACETAMINOPHEN 325 MG PO TABS: 650.0000 mg | ORAL_TABLET | ORAL | Status: DC | PRN

## 2020-08-28 MED ORDER — APIXABAN 5 MG PO TABS
5.0000 mg | ORAL_TABLET | Freq: Two times a day (BID) | ORAL | Status: DC
  Administered 2020-08-28 – 2020-09-04 (×12): 5 mg via ORAL
  Filled 2020-08-28 (×13): qty 1

## 2020-08-28 MED ORDER — METHOCARBAMOL 500 MG PO TABS
500.0000 mg | ORAL_TABLET | Freq: Four times a day (QID) | ORAL | Status: DC | PRN
  Administered 2020-09-01 – 2020-09-04 (×5): 500 mg via ORAL
  Filled 2020-08-28 (×5): qty 1

## 2020-08-28 MED ORDER — SODIUM CHLORIDE 0.45 % IV SOLN: INTRAVENOUS | Status: DC

## 2020-08-28 MED ORDER — MAGNESIUM CITRATE PO SOLN
1.0000 | Freq: Once | ORAL | Status: DC | PRN
  Filled 2020-08-28: qty 296

## 2020-08-28 NOTE — Assessment & Plan Note (Addendum)
-    remdesivir (5 days completed 7/21) and decadron x 10 days completed on 7/26

## 2020-08-28 NOTE — Assessment & Plan Note (Addendum)
-   on Eliquis at home - continue metoprolol

## 2020-08-28 NOTE — Progress Notes (Signed)
Progress Note    Kim Orr   EQA:[id number]  DOB: 01-07-1941  DOA: 08/26/2020     2  PCP: Lajean Manes, MD  CC: fall at home  Hospital Course: Kim Orr is an 80 year old female with HTN, HLD, obesity, hyperthyroidism who presented to the hospital after mechanical fall at home. After evaluation she was found to have an acute moderately displaced and angulated subtrochanteric fracture of the proximal left femur.  She was evaluated by orthopedic surgery and underwent ORIF on 08/27/2020. During evaluation on admission she was also found to be hypoxic and positive for COVID-19.  She was started on remdesivir and Decadron.  She is not typically on oxygen at home.  Interval History:  No events overnight.  Remains on oxygen but saturations were 99 to 100% when seen in her room this morning.  Turned oxygen down to 2 L and informed her of the changes and that we will continue to wean as able.  ROS: Constitutional: negative for chills, fatigue, and fevers, Respiratory: negative for cough, sputum, and wheezing, Cardiovascular: negative for chest pain, and Gastrointestinal: negative for abdominal pain  Assessment & Plan: * Closed left subtrochanteric femur fracture ([place]) - "Acute moderately displaced and angulated subtrochanteric fracture of the proximal left femur." - s/p ORIF on 7/18 with ortho - continue pain control - follow up PT evals  Acute respiratory failure with hypoxia (HCC) - due to covid - wean O2 as able  - see covid tx  COVID-19 virus infection - continue remdesivir and decadron  -Continue isolation precautions  Hyperthyroidism - continue methimazole   Class 2 obesity due to excess calories with body mass index (BMI) of 37.0 to 37.9 in adult Body mass index is 37.54 kg/m. - contributes to co-morbidities and healing   Atrial fibrillation, chronic (HCC) - on Eliquis at home - continue metoprolol  Mixed hyperlipidemia - continue statin   Essential  hypertension - continue Toprol     Old records reviewed in assessment of this patient  Antimicrobials:   DVT prophylaxis: SCDs Start: 08/28/20 0027 SCDs Start: 08/26/20 1421 apixaban (ELIQUIS) tablet 5 mg   Code Status:   Code Status: DNR Family Communication:   Disposition Plan: Status is: Inpatient  Remains inpatient appropriate because:Unsafe d/c plan, IV treatments appropriate due to intensity of illness or inability to take PO, and Inpatient level of care appropriate due to severity of illness  Dispo: The patient is from: Home              Anticipated d/c is to:  [place]              Patient currently is not medically stable to d/c.   Difficult to place patient Yes      Risk of unplanned readmission score: Unplanned Admission- Pilot do not use: 21.87   Objective: Blood pressure (!) 99/59, pulse 75, temperature 97.8 F (36.6 C), temperature source Oral, resp. rate 16, height 5\' 11"  (1.803 m), weight 122.1 kg, SpO2 99 %.  Examination: General appearance: alert, cooperative, and no distress Head: Normocephalic, without obvious abnormality, atraumatic Eyes:  EOMI Lungs: clear to auscultation bilaterally Heart: regular rate and rhythm and S1, S2 normal Abdomen: normal findings: bowel sounds normal and soft, non-tender Extremities:  Left hip surgical dressings in place and compartments soft Skin: mobility and turgor normal Neurologic: Left lower extremity strength limited by pain from surgery otherwise no focal deficits  Consultants:  Orthopedic surgery  Procedures:    Data Reviewed: I have personally  reviewed following labs and imaging studies Results for orders placed or performed during the hospital encounter of 08/26/20 (from the past 24 hour(s))  Glucose, capillary     Status: Abnormal   Collection Time: 08/27/20  4:52 PM  Result Value Ref Range   Glucose-Capillary 106 (H) 70 - 99 mg/dL  Type and screen [place]     Status: None    Collection Time: 08/27/20  6:53 PM  Result Value Ref Range   ABO/RH(D) O POS    Antibody Screen NEG    Sample Expiration      08/30/2020,2359 Performed at [facility], [place] [address], [place], [place] [address]   Glucose, capillary     Status: Abnormal   Collection Time: 08/28/20 12:03 AM  Result Value Ref Range   Glucose-Capillary 120 (H) 70 - 99 mg/dL  Glucose, capillary     Status: None   Collection Time: 08/28/20  5:42 AM  Result Value Ref Range   Glucose-Capillary 95 70 - 99 mg/dL  CBC with Differential/Platelet     Status: Abnormal   Collection Time: 08/28/20  6:51 AM  Result Value Ref Range   WBC 10.3 4.0 - 10.5 K/uL   RBC 2.64 (L) 3.87 - 5.11 MIL/uL   Hemoglobin 8.8 (L) 12.0 - 15.0 g/dL   HCT 27.5 (L) 36.0 - 46.0 %   MCV 104.2 (H) 80.0 - 100.0 fL   MCH 33.3 26.0 - 34.0 pg   MCHC 32.0 30.0 - 36.0 g/dL   RDW 13.7 11.5 - 15.5 %   Platelets 153 150 - 400 K/uL   nRBC 0.0 0.0 - 0.2 %   Neutrophils Relative % 84 %   Neutro Abs 8.7 (H) 1.7 - 7.7 K/uL   Lymphocytes Relative 8 %   Lymphs Abs 0.8 0.7 - 4.0 K/uL   Monocytes Relative 7 %   Monocytes Absolute 0.8 0.1 - 1.0 K/uL   Eosinophils Relative 0 %   Eosinophils Absolute 0.0 0.0 - 0.5 K/uL   Basophils Relative 0 %   Basophils Absolute 0.0 0.0 - 0.1 K/uL   Immature Granulocytes 1 %   Abs Immature Granulocytes 0.05 0.00 - 0.07 K/uL  Comprehensive metabolic panel     Status: Abnormal   Collection Time: 08/28/20  6:51 AM  Result Value Ref Range   Sodium 138 135 - 145 mmol/L   Potassium 5.1 3.5 - 5.1 mmol/L   Chloride 104 98 - 111 mmol/L   CO2 29 22 - 32 mmol/L   Glucose, Bld 132 (H) 70 - 99 mg/dL   BUN 13 8 - 23 mg/dL   Creatinine, Ser 0.70 0.44 - 1.00 mg/dL   Calcium 8.6 (L) 8.9 - 10.3 mg/dL   Total Protein 4.8 (L) 6.5 - 8.1 g/dL   Albumin 3.0 (L) 3.5 - 5.0 g/dL   AST 44 (H) 15 - 41 U/L   ALT 34 0 - 44 U/L   Alkaline Phosphatase 60 38 - 126 U/L   Total Bilirubin 0.6 0.3 - 1.2 mg/dL   GFR, Estimated >60  >60 mL/min   Anion gap 5 5 - 15  C-reactive protein     Status: Abnormal   Collection Time: 08/28/20  6:51 AM  Result Value Ref Range   CRP 4.8 (H) <1.0 mg/dL  D-dimer, quantitative     Status: Abnormal   Collection Time: 08/28/20  6:51 AM  Result Value Ref Range   D-Dimer, Quant 2.67 (H) 0.00 - 0.50 ug/mL-FEU  Ferritin     Status: Abnormal   Collection Time: 08/28/20  6:51 AM  Result Value Ref Range   Ferritin 312 (H) 11 - 307 ng/mL  Glucose, capillary     Status: Abnormal   Collection Time: 08/28/20 12:08 PM  Result Value Ref Range   Glucose-Capillary 108 (H) 70 - 99 mg/dL    Recent Results (from the past 240 hour(s))  Resp Panel by RT-PCR (Flu A&B, Covid) Nasopharyngeal Swab     Status: Abnormal   Collection Time: 08/26/20 12:35 PM   Specimen: Nasopharyngeal Swab; Nasopharyngeal(NP) swabs in vial transport medium  Result Value Ref Range Status   SARS Coronavirus 2 by RT PCR POSITIVE (A) NEGATIVE Final    Comment: RESULT CALLED TO, READ BACK BY AND VERIFIED WITH: RN E.BANKS ON [id number] AT 1339 BY E.PARRISH (NOTE) SARS-CoV-2 target nucleic acids are DETECTED.  The SARS-CoV-2 RNA is generally detectable in upper respiratory specimens during the acute phase of infection. Positive results are indicative of the presence of the identified virus, but do not rule out bacterial infection or co-infection with other pathogens not detected by the test. Clinical correlation with patient history and other diagnostic information is necessary to determine patient infection status. The expected result is Negative.  Fact Sheet for Patients: EntrepreneurPulse.com.au  Fact Sheet for Healthcare Providers: IncredibleEmployment.be  This test is not yet approved or cleared by the [place] FDA and  has been authorized for detection and/or diagnosis of SARS-CoV-2 by FDA under an Emergency Use Authorization (EUA).  This EUA will remain in effect (meaning  this t est can be used) for the duration of  the COVID-19 declaration under Section 564(b)(1) of the Act, 21 U.S.C. section 360bbb-3(b)(1), unless the authorization is terminated or revoked sooner.     Influenza A by PCR NEGATIVE NEGATIVE Final   Influenza B by PCR NEGATIVE NEGATIVE Final    Comment: (NOTE) The Xpert Xpress SARS-CoV-2/FLU/RSV plus assay is intended as an aid in the diagnosis of influenza from Nasopharyngeal swab specimens and should not be used as a sole basis for treatment. Nasal washings and aspirates are unacceptable for Xpert Xpress SARS-CoV-2/FLU/RSV testing.  Fact Sheet for Patients: EntrepreneurPulse.com.au  Fact Sheet for Healthcare Providers: IncredibleEmployment.be  This test is not yet approved or cleared by the [place] FDA and has been authorized for detection and/or diagnosis of SARS-CoV-2 by FDA under an Emergency Use Authorization (EUA). This EUA will remain in effect (meaning this test can be used) for the duration of the COVID-19 declaration under Section 564(b)(1) of the Act, 21 U.S.C. section 360bbb-3(b)(1), unless the authorization is terminated or revoked.  Performed at [facility], [place] [address] [place], [place] [address]   Surgical pcr screen     Status: None   Collection Time: 08/27/20  9:01 AM   Specimen: Nasal Mucosa; Nasal Swab  Result Value Ref Range Status   MRSA, PCR NEGATIVE NEGATIVE Final   Staphylococcus aureus NEGATIVE NEGATIVE Final    Comment: (NOTE) The Xpert SA Assay (FDA approved for NASAL specimens in patients 78 years of age and older), is one component of a comprehensive surveillance program. It is not intended to diagnose infection nor to guide or monitor treatment. Performed at [facility], [place] [address], [place], [place] [address]      Radiology Studies: DG CHEST PORT 1 VIEW  Result Date: 08/26/2020 CLINICAL DATA:  Left hip fracture, COVID-19  positive EXAM: PORTABLE CHEST 1 VIEW COMPARISON:  05/08/2020 FINDINGS:  Single frontal view of the chest demonstrates an unremarkable cardiac silhouette. Stable ectasia of the thoracic aorta. No airspace disease, effusion, or pneumothorax. Mild chronic scarring at the lung bases. No acute bony abnormalities. IMPRESSION: 1. Stable chest, no acute process. Electronically Signed   By: Randa Ngo M.D.   On: 08/26/2020 19:34   DG C-Arm 1-60 Min  Result Date: 08/27/2020 CLINICAL DATA:  Left femoral ORIF EXAM: LEFT FEMUR 2 VIEWS; DG C-ARM 1-60 MIN COMPARISON:  11:29 a.m. FINDINGS: Six fluoroscopic intraoperative radiographs of the left hip demonstrate ORIF of a subtrochanteric femoral fracture utilizing a intramedullary rod with proximal and distal interlocking screws. Fracture fragments are in grossly anatomic alignment on this limited examination. No unexpected fracture or dislocation. Moderate left hip degenerative arthritis noted. Extensive heterotopic ossification surrounds the left hip. Fluoroscopy time: 2 minutes 12 seconds Fluoroscopic images: 6 Dose: Not provided, refer to operative report IMPRESSION: Left femoral ORIF as described above Electronically Signed   By: Fidela Salisbury MD   On: 08/27/2020 21:33   DG FEMUR MIN 2 VIEWS LEFT  Result Date: 08/27/2020 CLINICAL DATA:  Left femoral ORIF EXAM: LEFT FEMUR 2 VIEWS; DG C-ARM 1-60 MIN COMPARISON:  11:29 a.m. FINDINGS: Six fluoroscopic intraoperative radiographs of the left hip demonstrate ORIF of a subtrochanteric femoral fracture utilizing a intramedullary rod with proximal and distal interlocking screws. Fracture fragments are in grossly anatomic alignment on this limited examination. No unexpected fracture or dislocation. Moderate left hip degenerative arthritis noted. Extensive heterotopic ossification surrounds the left hip. Fluoroscopy time: 2 minutes 12 seconds Fluoroscopic images: 6 Dose: Not provided, refer to operative report IMPRESSION: Left  femoral ORIF as described above Electronically Signed   By: Fidela Salisbury MD   On: 08/27/2020 21:33   DG FEMUR MIN 2 VIEWS LEFT  Final Result    DG C-Arm 1-60 Min  Final Result    DG CHEST PORT 1 VIEW  Final Result    CT Head Wo Contrast  Final Result    DG Hip Unilat With Pelvis 2-3 Views Left  Final Result      Scheduled Meds:  acetaminophen  500 mg Oral Q6H   apixaban  5 mg Oral BID   vitamin C  500 mg Oral Daily   dexamethasone (DECADRON) injection  10 mg Intravenous Q24H   docusate sodium  100 mg Oral BID   feeding supplement (GLUCERNA SHAKE)  237 mL Oral BID BM   ferrous sulfate  325 mg Oral TID PC   gabapentin  300 mg Oral Daily   gabapentin  600 mg Oral QHS   insulin aspart  0-15 Units Subcutaneous TID WC   melatonin  10 mg Oral QHS   methimazole  5 mg Oral Daily   metoprolol succinate  100 mg Oral Daily   multivitamin with minerals  1 tablet Oral Daily   Ensure Max Protein  11 oz Oral QHS   senna  1 tablet Oral BID   simvastatin  20 mg Oral QHS   zinc sulfate  220 mg Oral Daily   PRN Meds: acetaminophen, albuterol, alum & mag hydroxide-simeth, bisacodyl, chlorpheniramine-HYDROcodone, guaiFENesin-dextromethorphan, magnesium citrate, menthol-cetylpyridinium **OR** phenol, methocarbamol **OR** [DISCONTINUED] methocarbamol (ROBAXIN) IV, morphine injection, ondansetron **OR** ondansetron (ZOFRAN) IV, oxyCODONE, polyethylene glycol Continuous Infusions:  remdesivir 100 mg in NS 100 mL 100 mg (08/28/20 1053)     LOS: 2 days  Time spent: Greater than 50% of the 35 minute visit was spent in counseling/coordination of care for the patient as  laid out in the A&P.   Dwyane Dee, MD [facility] 08/28/2020, 3:48 PM

## 2020-08-28 NOTE — Assessment & Plan Note (Signed)
continue statin

## 2020-08-28 NOTE — Assessment & Plan Note (Addendum)
- "  Acute moderately displaced and angulated subtrochanteric fracture of the proximal left femur." - s/p ORIF on 7/18 with ortho - continue pain control - follow up PT evals: SNF recommended. Discharge to [facility] once bed avail and insurance approves

## 2020-08-28 NOTE — Evaluation (Signed)
Physical Therapy Evaluation Patient Details Name: Kim Orr MRN: [id number] DOB: 1940-03-19 Today's Date: 08/28/2020   History of Present Illness  80 yo female presents to [facility] from [facility] on 7/17 for fall. Pt sustained Acute moderately displaced and angulated subtrochanteric fracture of  the proximal left femur. s/p L femur IMN introtrochantric on 7/18. Recent admission 04/2020 for aflutter with RVR. PMH includes chronic pain syndrome, sciatica, HTN, lymphedema, morbid obesity, HLD, OA, lap chole.  Clinical Impression  Pt presents with impaired strength, severe LE pain L>R post-operatively, significant difficulty performing bed-level tasks, fair to poor sitting balance, and decreased activity tolerance vs baseline. Pt to benefit from acute PT to address deficits. Pt requiring max assist for all bed-level mobility today, very limited by anxiety and requires increased time and cuing. Pt would benefit from +2 assist next session. PT to progress mobility as tolerated, and will continue to follow acutely.   SpO2 78-94% on 2LO2 via [place], cues for breathing technique throughout with good recovery with technique and rest     Follow Up Recommendations SNF;Supervision/Assistance - 24 hour    Equipment Recommendations  None recommended by PT    Recommendations for Other Services       Precautions / Restrictions Precautions Precautions: Fall Restrictions Weight Bearing Restrictions: No LLE Weight Bearing: Weight bearing as tolerated      Mobility  Bed Mobility Overal bed mobility: Needs Assistance Bed Mobility: Rolling;Supine to Sit;Sit to Supine Rolling: Max assist   Supine to sit: Max assist Sit to supine: Max assist   General bed mobility comments: max assist for all mobility for trunk and LE management, scooting to/from EOB, and boost up in bed with trendelenburg and bed pad.    Transfers                 General transfer comment: nt  Ambulation/Gait                 Stairs            Wheelchair Mobility    Modified Rankin (Stroke Patients Only)       Balance Overall balance assessment: Needs assistance;History of Falls Sitting-balance support: Feet supported;Bilateral upper extremity supported Sitting balance-Leahy Scale: Fair Sitting balance - Comments: fair to poor, periods of unsupported sitting but initially requiring bilat UE support of bedrails and PT truncal assist Postural control: Posterior lean     Standing balance comment: nt                             Pertinent Vitals/Pain Pain Assessment: Faces Faces Pain Scale: Hurts even more Pain Location: LLE Pain Descriptors / Indicators: Sore;Discomfort;Grimacing;Moaning;Crying Pain Intervention(s): Limited activity within patient's tolerance;Monitored during session;Repositioned    Home Living Family/patient expects to be discharged to:: Skilled nursing facility                 Additional Comments: Resides at [facility] apt with her husband, on 3rd floor with elevator. Pt's husband uses a RW.    Prior Function Level of Independence: Needs assistance   Gait / Transfers Assistance Needed: Pt gets around apt with rollator, uses w/c for out of room distances and she takes the foot plates off to foot pedal  ADL's / Homemaking Assistance Needed: Pt's husband assists with ADLs (including dressing, bathing).  Comments: Pt reports she uses a hospital bed     Hand Dominance   Dominant Hand: Right  Extremity/Trunk Assessment   Upper Extremity Assessment Upper Extremity Assessment: Defer to OT evaluation    Lower Extremity Assessment Lower Extremity Assessment: Generalized weakness;LLE deficits/detail RLE Deficits / Details: edematous, rubor LLE Deficits / Details: blistering on dorsum of L foot, edematous. Able to perform quad set, very limited ROM heel slide LLE: Unable to fully assess due to pain    Cervical / Trunk  Assessment Cervical / Trunk Assessment: Normal  Communication   Communication: No difficulties  Cognition Arousal/Alertness: Awake/alert Behavior During Therapy: Anxious Overall Cognitive Status: Impaired/Different from baseline Area of Impairment: Attention;Following commands;Safety/judgement;Problem solving                   Current Attention Level: Sustained   Following Commands: Follows one step commands inconsistently;Follows one step commands with increased time Safety/Judgement: Decreased awareness of deficits;Decreased awareness of safety   Problem Solving: Requires verbal cues;Requires tactile cues;Difficulty sequencing;Decreased initiation General Comments: Pt stalls frequently stating "wait wait wait" and "move me slowly", pt very limited by anxiety and fear of falling.      General Comments General comments (skin integrity, edema, etc.): 2LO2, SpO38min 78% with no notable dyspnea, recovers to 91-94% with breathing technique (in through nose, out through mouth) and rest.    Exercises General Exercises - Lower Extremity Long Arc Quad: AROM;Both;5 reps;Seated   Assessment/Plan    PT Assessment Patient needs continued PT services  PT Problem List Decreased strength;Decreased mobility;Decreased safety awareness;Decreased activity tolerance;Decreased balance;Decreased knowledge of use of DME;Pain;Decreased range of motion;Decreased skin integrity;Cardiopulmonary status limiting activity;Obesity       PT Treatment Interventions Therapeutic activities;Gait training;Therapeutic exercise;Patient/family education;DME instruction;Balance training;Functional mobility training    PT Goals (Current goals can be found in the Care Plan section)  Acute Rehab PT Goals Patient Stated Goal: go home to husband PT Goal Formulation: With patient Time For Goal Achievement: 09/11/20 Potential to Achieve Goals: Fair    Frequency Min 3X/week   Barriers to discharge         Co-evaluation               AM-PAC PT "6 Clicks" Mobility  Outcome Measure Help needed turning from your back to your side while in a flat bed without using bedrails?: A Lot Help needed moving from lying on your back to sitting on the side of a flat bed without using bedrails?: A Lot Help needed moving to and from a bed to a chair (including a wheelchair)?: Total Help needed standing up from a chair using your arms (e.g., wheelchair or bedside chair)?: Total Help needed to walk in hospital room?: Total Help needed climbing 3-5 steps with a railing? : Total 6 Click Score: 8    End of Session Equipment Utilized During Treatment: Oxygen Activity Tolerance: Patient limited by pain;Patient limited by fatigue;Other (comment) (anxiety) Patient left: in bed;with call bell/phone within reach;with bed alarm set Nurse Communication: Mobility status PT Visit Diagnosis: Other abnormalities of gait and mobility (R26.89);Muscle weakness (generalized) (M62.81);Pain Pain - Right/Left: Left Pain - part of body: Leg;Hip    Time: 6546-5035 PT Time Calculation (min) (ACUTE ONLY): 39 min   Charges:   PT Evaluation $PT Eval Low Complexity: 1 Low PT Treatments $Therapeutic Activity: 8-22 mins $Neuromuscular Re-education: 8-22 mins       Stacie Glaze, PT DPT Acute Rehabilitation Services Pager [phone]  Office [phone]   Roxine Caddy E Ruffin Pyo 08/28/2020, 4:28 PM

## 2020-08-28 NOTE — Progress Notes (Signed)
On call provider notified about blisters on left foot post op. Patient refused ice therapy.

## 2020-08-28 NOTE — Assessment & Plan Note (Addendum)
-   Presumed due to COVID initially however given significant immobility, body habitus, and recent surgery she may have a component of compressive atelectasis as well as possible underlying OSA/OHS -At this point, she may start to become oxygen dependent until her mobility can significantly improve which was poor prior to admission or her fracture already - wean O2 as able; seems to require oxygen still intermittently.  This can easily be arranged for continuing at discharge as she is starting to develop more need for it once again - for now she remains on RA again as of 7/22

## 2020-08-28 NOTE — Assessment & Plan Note (Signed)
-   continue Toprol

## 2020-08-28 NOTE — Assessment & Plan Note (Signed)
-   continue methimazole

## 2020-08-28 NOTE — Progress Notes (Signed)
Subjective: 1 Day Post-Op s/p Procedure(s): INTRAMEDULLARY (IM) NAIL INTERTROCHANTRIC; LEFT  Tearful on exam, wanting to leave the hospital today. She is eager to discharge home. She is alert and oriented. Reports pain as moderate this morning. Denies chest pain, SOB, Calf pain. No nausea/vomiting.    Objective:  PE: VITALS:   Vitals:   08/27/20 2225 08/27/20 2240 08/27/20 2251 08/28/20 0500  BP: (!) 117/92 (!) 109/57 (!) 106/51 (!) 103/47  Pulse: 88 78  77  Resp: 14 16  16   Temp:   98 F (36.7 C) 97.7 F (36.5 C)  TempSrc:    Axillary  SpO2: (!) 89% 99%  98%  Weight:      Height:       MSK: LLE - dressings CDI. Distal sensation intact. Dorsiflexion and plantarflexion intact. DP pulse found on doppler. Blistering seen on dorsal left foot likely due to traction or boots used during surgery.    LABS  Results for orders placed or performed during the hospital encounter of 08/26/20 (from the past 24 hour(s))  Surgical pcr screen     Status: None   Collection Time: 08/27/20  9:01 AM   Specimen: Nasal Mucosa; Nasal Swab  Result Value Ref Range   MRSA, PCR NEGATIVE NEGATIVE   Staphylococcus aureus NEGATIVE NEGATIVE  Glucose, capillary     Status: None   Collection Time: 08/27/20 11:34 AM  Result Value Ref Range   Glucose-Capillary 89 70 - 99 mg/dL  Glucose, capillary     Status: Abnormal   Collection Time: 08/27/20  4:52 PM  Result Value Ref Range   Glucose-Capillary 106 (H) 70 - 99 mg/dL  Type and screen [place]     Status: None   Collection Time: 08/27/20  6:53 PM  Result Value Ref Range   ABO/RH(D) O POS    Antibody Screen NEG    Sample Expiration      08/30/2020,2359 Performed at [facility], [place] [address] [place], [place] [address]   Glucose, capillary     Status: Abnormal   Collection Time: 08/28/20 12:03 AM  Result Value Ref Range   Glucose-Capillary 120 (H) 70 - 99 mg/dL  Glucose, capillary     Status: None   Collection  Time: 08/28/20  5:42 AM  Result Value Ref Range   Glucose-Capillary 95 70 - 99 mg/dL    CT Head Wo Contrast  Result Date: 08/26/2020 CLINICAL DATA:  Poly trauma EXAM: CT HEAD WITHOUT CONTRAST TECHNIQUE: Contiguous axial images were obtained from the base of the skull through the vertex without intravenous contrast. COMPARISON:  None. FINDINGS: Brain: No evidence of acute infarction, hemorrhage, hydrocephalus, extra-axial collection or mass lesion/mass effect. Vascular: No hyperdense vessel or unexpected calcification. Skull: Normal. Negative for fracture or focal lesion. Sinuses/Orbits: Mild polypoid mucosal thickening of the maxillary sinuses. Other: None. IMPRESSION: No acute intracranial abnormality. Electronically Signed   By: Fidela Salisbury M.D.   On: 08/26/2020 12:14   DG CHEST PORT 1 VIEW  Result Date: 08/26/2020 CLINICAL DATA:  Left hip fracture, COVID-19 positive EXAM: PORTABLE CHEST 1 VIEW COMPARISON:  05/08/2020 FINDINGS: Single frontal view of the chest demonstrates an unremarkable cardiac silhouette. Stable ectasia of the thoracic aorta. No airspace disease, effusion, or pneumothorax. Mild chronic scarring at the lung bases. No acute bony abnormalities. IMPRESSION: 1. Stable chest, no acute process. Electronically Signed   By: Randa Ngo M.D.   On: 08/26/2020 19:34   DG C-Arm 1-60  Min  Result Date: 08/27/2020 CLINICAL DATA:  Left femoral ORIF EXAM: LEFT FEMUR 2 VIEWS; DG C-ARM 1-60 MIN COMPARISON:  11:29 a.m. FINDINGS: Six fluoroscopic intraoperative radiographs of the left hip demonstrate ORIF of a subtrochanteric femoral fracture utilizing a intramedullary rod with proximal and distal interlocking screws. Fracture fragments are in grossly anatomic alignment on this limited examination. No unexpected fracture or dislocation. Moderate left hip degenerative arthritis noted. Extensive heterotopic ossification surrounds the left hip. Fluoroscopy time: 2 minutes 12 seconds  Fluoroscopic images: 6 Dose: Not provided, refer to operative report IMPRESSION: Left femoral ORIF as described above Electronically Signed   By: Fidela Salisbury MD   On: 08/27/2020 21:33   DG Hip Unilat With Pelvis 2-3 Views Left  Result Date: 08/26/2020 CLINICAL DATA:  Fall with left hip pain EXAM: DG HIP (WITH OR WITHOUT PELVIS) 2-3V LEFT COMPARISON:  None. FINDINGS: Acute moderately displaced and angulated subtrochanteric fracture of the proximal left femur. Fracture involvement of the intertrochanteric region difficult to exclude. Moderate osteoarthritis of both hips. Extensive heterotopic ossification about the left hip. No dislocation. No additional fractures are identified. IMPRESSION: Acute moderately displaced and angulated subtrochanteric fracture of the proximal left femur. Electronically Signed   By: Davina Poke D.O.   On: 08/26/2020 12:05   DG FEMUR MIN 2 VIEWS LEFT  Result Date: 08/27/2020 CLINICAL DATA:  Left femoral ORIF EXAM: LEFT FEMUR 2 VIEWS; DG C-ARM 1-60 MIN COMPARISON:  11:29 a.m. FINDINGS: Six fluoroscopic intraoperative radiographs of the left hip demonstrate ORIF of a subtrochanteric femoral fracture utilizing a intramedullary rod with proximal and distal interlocking screws. Fracture fragments are in grossly anatomic alignment on this limited examination. No unexpected fracture or dislocation. Moderate left hip degenerative arthritis noted. Extensive heterotopic ossification surrounds the left hip. Fluoroscopy time: 2 minutes 12 seconds Fluoroscopic images: 6 Dose: Not provided, refer to operative report IMPRESSION: Left femoral ORIF as described above Electronically Signed   By: Fidela Salisbury MD   On: 08/27/2020 21:33    Assessment/Plan: Left subtrochanteric hip fracture   1 Day Post-Op s/p Procedure(s): INTRAMEDULLARY (IM) NAIL INTERTROCHANTRIC; LEFT  Weightbearing: WBAT LLE Insicional and dressing care: Reinforce dressings as needed VTE prophylaxis: Home eliquis  to start 24 hour post op if hemoglobin stable Pain control: continue current regimen Follow - up plan: Dr. Mardelle Matte in 2 weeks Dispo: pending PT/OT evals  Contact information:   Weekdays 8-5 Merlene Pulling, PA-C [phone] A fter hours and holidays please check [email or web address] for group call information for [facility]  Ventura Bruns 08/28/2020, 7:29 AM

## 2020-08-28 NOTE — Assessment & Plan Note (Signed)
Body mass index is 37.54 kg/m. - contributes to co-morbidities and healing

## 2020-08-28 NOTE — Progress Notes (Signed)
ANTICOAGULATION CONSULT NOTE - Initial Consult  Pharmacy Consult for Eliquis Indication: atrial fibrillation  No Known Allergies  Patient Measurements: Height: 5\' 11"  (180.3 cm) Weight: 122.1 kg (269 lb 2.9 oz) IBW/kg (Calculated) : 70.8  Vital Signs: Temp: 97.8 F (36.6 C) (07/19 0752) Temp Source: Oral (07/19 0752) BP: 99/59 (07/19 0752) Pulse Rate: 75 (07/19 0752)  Labs: Recent Labs    08/26/20 1103 08/27/20 0335 08/28/20 0651  HGB 12.5 11.4* 8.8*  HCT 39.6 34.7* 27.5*  PLT 150 160 153  LABPROT 15.9*  --   --   INR 1.3*  --   --   CREATININE 0.87 0.88 0.70    Estimated Creatinine Clearance: 80.8 mL/min (by C-G formula based on SCr of 0.7 mg/dL).   Medical History: Past Medical History:  Diagnosis Date   Atrial fibrillation, chronic ([place]) 08/26/2020   DNR (do not resuscitate) 08/26/2020   Essential hypertension 07/01/2017   Hemorrhoids 03/16/2017   History of nephrolithiasis 04/14/2016   Hyperlipidemia 07/01/2017   Hyperthyroidism 05/03/2020   Multinodular goiter 06/01/2018   Formatting of this note is different from the original. 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  Risk of malignancy 5-15%     Osteoarthritis 07/01/2017   Sciatica 05/03/2020   Status post cholecystectomy 03/16/2017   Formatting of this note might be different from the original. Robotic cholecystectomy performed 1/30 @ [facility] of this note might be different from the original. Robotic cholecystectomy performed 1/30 @ [facility]    Assessment: CC/HPI: COVID / Fall, hip fracture  PMH: AF on eliquis; HTN; HLD: obesity; and hyperthyroidism  Anticoag: Eliquis 5mg  (AF) PTA. LD 7/17AM - Hgb 8.8 ok to resume POD1 on 7/19. Ddimer up to 2.67 (COVID)  Goal of Therapy:  Therapeutic oral anticoagulation   Plan:  Pharm consult to resume home DOAC on AM of POD 1 if Hgb =/> 8 and pt not requiring blood transfusion. Resume this PM after discussion with  ortho PA on chat. Eliquis 5mg  [facility] will sign off. Please reconsult for further dosing assitance.   Dusan Lipford S. Alford Highland, PharmD, BCPS Clinical Staff Pharmacist [email or web address] Alford Highland, [place] 08/28/2020,10:12 AM

## 2020-08-28 NOTE — TOC Progression Note (Signed)
Transition of Care [facility]) - Progression Note    Patient Details  Name: Kim Orr MRN: [id number] Date of Birth: 14-Nov-1940  Transition of [facility]) CM/SW Contact  Milinda Antis, [place] Phone Number: 08/28/2020, 9:39 AM  Clinical Narrative:     09:07-  CSW was contacted by Claiborne Billings at [facility] and was informed that due to the patient's positive COVID screening, the patient could not return until after Saturday, 09/01/2020.         Expected Discharge Plan and Services                                                 Social Determinants of Health (SDOH) Interventions    Readmission Risk Interventions No flowsheet data found.

## 2020-08-28 NOTE — Hospital Course (Addendum)
Kim Orr is an 80 year old female with HTN, HLD, obesity, hyperthyroidism who presented to the hospital after mechanical fall at home. After evaluation she was found to have an acute moderately displaced and angulated subtrochanteric fracture of the proximal left femur.  She was evaluated by orthopedic surgery and underwent ORIF on 08/27/2020. During evaluation on admission she was also found to be hypoxic and positive for COVID-19.  She was started on remdesivir and Decadron.  She is not typically on oxygen at home. She was treated with 5 days remdesivir and 10 days decadron in the hospital and weaned down to room air prior to discharge.  See below for further A&P.

## 2020-08-29 ENCOUNTER — Inpatient Hospital Stay (HOSPITAL_COMMUNITY): Payer: Medicare HMO

## 2020-08-29 ENCOUNTER — Encounter (HOSPITAL_COMMUNITY): Payer: Self-pay | Admitting: Orthopedic Surgery

## 2020-08-29 DIAGNOSIS — U071 COVID-19: Secondary | ICD-10-CM | POA: Diagnosis not present

## 2020-08-29 DIAGNOSIS — J9601 Acute respiratory failure with hypoxia: Secondary | ICD-10-CM | POA: Diagnosis not present

## 2020-08-29 DIAGNOSIS — S7222XA Displaced subtrochanteric fracture of left femur, initial encounter for closed fracture: Secondary | ICD-10-CM | POA: Diagnosis not present

## 2020-08-29 LAB — COMPREHENSIVE METABOLIC PANEL
ALT: 23 U/L (ref 0–44)
AST: 35 U/L (ref 15–41)
Albumin: 2.7 g/dL — ABNORMAL LOW (ref 3.5–5.0)
Alkaline Phosphatase: 52 U/L (ref 38–126)
Anion gap: 5 (ref 5–15)
BUN: 12 mg/dL (ref 8–23)
CO2: 28 mmol/L (ref 22–32)
Calcium: 8.3 mg/dL — ABNORMAL LOW (ref 8.9–10.3)
Chloride: 103 mmol/L (ref 98–111)
Creatinine, Ser: 0.59 mg/dL (ref 0.44–1.00)
GFR, Estimated: >60 mL/min (ref >60)
Glucose, Bld: 137 mg/dL — ABNORMAL HIGH (ref 70–99)
Potassium: 4.2 mmol/L (ref 3.5–5.1)
Sodium: 136 mmol/L (ref 135–145)
Total Bilirubin: 0.6 mg/dL (ref 0.3–1.2)
Total Protein: 4.5 g/dL — ABNORMAL LOW (ref 6.5–8.1)

## 2020-08-29 LAB — GLUCOSE, CAPILLARY
Glucose-Capillary: 124 mg/dL — ABNORMAL HIGH (ref 70–99)
Glucose-Capillary: 89 mg/dL (ref 70–99)
Glucose-Capillary: 101 mg/dL — ABNORMAL HIGH (ref 70–99)
Glucose-Capillary: 78 mg/dL (ref 70–99)
Glucose-Capillary: 163 mg/dL — ABNORMAL HIGH (ref 70–99)

## 2020-08-29 LAB — CBC WITH DIFFERENTIAL/PLATELET
Abs Immature Granulocytes: 0.05 10*3/uL (ref 0.00–0.07)
Basophils Absolute: 0 10*3/uL (ref 0.0–0.1)
Basophils Relative: 0 %
Eosinophils Absolute: 0 10*3/uL (ref 0.0–0.5)
Eosinophils Relative: 0 %
HCT: 24.3 % — ABNORMAL LOW (ref 36.0–46.0)
Hemoglobin: 7.9 g/dL — ABNORMAL LOW (ref 12.0–15.0)
Immature Granulocytes: 1 %
Lymphocytes Relative: 9 %
Lymphs Abs: 0.7 10*3/uL (ref 0.7–4.0)
MCH: 33.6 pg (ref 26.0–34.0)
MCHC: 32.5 g/dL (ref 30.0–36.0)
MCV: 103.4 fL — ABNORMAL HIGH (ref 80.0–100.0)
Monocytes Absolute: 0.8 10*3/uL (ref 0.1–1.0)
Monocytes Relative: 10 %
Neutro Abs: 6.5 10*3/uL (ref 1.7–7.7)
Neutrophils Relative %: 80 %
Platelets: 184 10*3/uL (ref 150–400)
RBC: 2.35 MIL/uL — ABNORMAL LOW (ref 3.87–5.11)
RDW: 13.7 % (ref 11.5–15.5)
WBC: 8 10*3/uL (ref 4.0–10.5)
nRBC: 0 % (ref 0.0–0.2)

## 2020-08-29 LAB — D-DIMER, QUANTITATIVE: D-Dimer, Quant: 0.73 ug/mL-FEU — ABNORMAL HIGH (ref 0.00–0.50)

## 2020-08-29 LAB — MAGNESIUM: Magnesium: 2.1 mg/dL (ref 1.7–2.4)

## 2020-08-29 LAB — C-REACTIVE PROTEIN: CRP: 6 mg/dL — ABNORMAL HIGH (ref <1.0)

## 2020-08-29 LAB — FERRITIN: Ferritin: 207 ng/mL (ref 11–307)

## 2020-08-29 NOTE — Progress Notes (Signed)
Subjective: 2 Days Post-Op s/p Procedure(s): INTRAMEDULLARY (IM) NAIL INTERTROCHANTRIC; LEFT  Improved mood today. States her left leg is sore but not severely painful. + SOB. No chest pain or calf pain. No nausea and vomiting.  Objective:  PE: VITALS:   Vitals:   08/28/20 0752 08/28/20 1500 08/28/20 2200 08/29/20 0743  BP: (!) 99/59 (!) 104/53 (!) 119/59 129/64  Pulse: 75 89 86 77  Resp: 16 18 17 18   Temp: 97.8 F (36.6 C) 98.6 F (37 C) 98.4 F (36.9 C) 98 F (36.7 C)  TempSrc: Oral Oral Oral Oral  SpO2: 99% 96% 95% 96%  Weight:      Height:       MSK: LLE - dressings CDI. Distal sensation intact. Dorsiflexion and plantarflexion intact. + DP pulse. Blistering on dorsal foot. No TTP to lateral left thigh.   LABS  Results for orders placed or performed during the hospital encounter of 08/26/20 (from the past 24 hour(s))  Glucose, capillary     Status: Abnormal   Collection Time: 08/28/20 12:08 PM  Result Value Ref Range   Glucose-Capillary 108 (H) 70 - 99 mg/dL  Glucose, capillary     Status: Abnormal   Collection Time: 08/28/20  4:29 PM  Result Value Ref Range   Glucose-Capillary 165 (H) 70 - 99 mg/dL  CBC with Differential/Platelet     Status: Abnormal   Collection Time: 08/29/20 12:43 AM  Result Value Ref Range   WBC 8.0 4.0 - 10.5 K/uL   RBC 2.35 (L) 3.87 - 5.11 MIL/uL   Hemoglobin 7.9 (L) 12.0 - 15.0 g/dL   HCT 24.3 (L) 36.0 - 46.0 %   MCV 103.4 (H) 80.0 - 100.0 fL   MCH 33.6 26.0 - 34.0 pg   MCHC 32.5 30.0 - 36.0 g/dL   RDW 13.7 11.5 - 15.5 %   Platelets 184 150 - 400 K/uL   nRBC 0.0 0.0 - 0.2 %   Neutrophils Relative % 80 %   Neutro Abs 6.5 1.7 - 7.7 K/uL   Lymphocytes Relative 9 %   Lymphs Abs 0.7 0.7 - 4.0 K/uL   Monocytes Relative 10 %   Monocytes Absolute 0.8 0.1 - 1.0 K/uL   Eosinophils Relative 0 %   Eosinophils Absolute 0.0 0.0 - 0.5 K/uL   Basophils Relative 0 %   Basophils Absolute 0.0 0.0 - 0.1 K/uL   Immature Granulocytes 1 %    Abs Immature Granulocytes 0.05 0.00 - 0.07 K/uL  Comprehensive metabolic panel     Status: Abnormal   Collection Time: 08/29/20 12:43 AM  Result Value Ref Range   Sodium 136 135 - 145 mmol/L   Potassium 4.2 3.5 - 5.1 mmol/L   Chloride 103 98 - 111 mmol/L   CO2 28 22 - 32 mmol/L   Glucose, Bld 137 (H) 70 - 99 mg/dL   BUN 12 8 - 23 mg/dL   Creatinine, Ser 0.59 0.44 - 1.00 mg/dL   Calcium 8.3 (L) 8.9 - 10.3 mg/dL   Total Protein 4.5 (L) 6.5 - 8.1 g/dL   Albumin 2.7 (L) 3.5 - 5.0 g/dL   AST 35 15 - 41 U/L   ALT 23 0 - 44 U/L   Alkaline Phosphatase 52 38 - 126 U/L   Total Bilirubin 0.6 0.3 - 1.2 mg/dL   GFR, Estimated >60 >60 mL/min   Anion gap 5 5 - 15  C-reactive protein     Status: Abnormal   Collection Time:  08/29/20 12:43 AM  Result Value Ref Range   CRP 6.0 (H) <1.0 mg/dL  D-dimer, quantitative     Status: Abnormal   Collection Time: 08/29/20 12:43 AM  Result Value Ref Range   D-Dimer, Quant 0.73 (H) 0.00 - 0.50 ug/mL-FEU  Ferritin     Status: None   Collection Time: 08/29/20 12:43 AM  Result Value Ref Range   Ferritin 207 11 - 307 ng/mL  Magnesium     Status: None   Collection Time: 08/29/20 12:43 AM  Result Value Ref Range   Magnesium 2.1 1.7 - 2.4 mg/dL  Glucose, capillary     Status: None   Collection Time: 08/29/20  7:09 AM  Result Value Ref Range   Glucose-Capillary 89 70 - 99 mg/dL    DG C-Arm 1-60 Min  Result Date: 08/27/2020 CLINICAL DATA:  Left femoral ORIF EXAM: LEFT FEMUR 2 VIEWS; DG C-ARM 1-60 MIN COMPARISON:  11:29 a.m. FINDINGS: Six fluoroscopic intraoperative radiographs of the left hip demonstrate ORIF of a subtrochanteric femoral fracture utilizing a intramedullary rod with proximal and distal interlocking screws. Fracture fragments are in grossly anatomic alignment on this limited examination. No unexpected fracture or dislocation. Moderate left hip degenerative arthritis noted. Extensive heterotopic ossification surrounds the left hip. Fluoroscopy  time: 2 minutes 12 seconds Fluoroscopic images: 6 Dose: Not provided, refer to operative report IMPRESSION: Left femoral ORIF as described above Electronically Signed   By: Fidela Salisbury MD   On: 08/27/2020 21:33   DG FEMUR MIN 2 VIEWS LEFT  Result Date: 08/27/2020 CLINICAL DATA:  Left femoral ORIF EXAM: LEFT FEMUR 2 VIEWS; DG C-ARM 1-60 MIN COMPARISON:  11:29 a.m. FINDINGS: Six fluoroscopic intraoperative radiographs of the left hip demonstrate ORIF of a subtrochanteric femoral fracture utilizing a intramedullary rod with proximal and distal interlocking screws. Fracture fragments are in grossly anatomic alignment on this limited examination. No unexpected fracture or dislocation. Moderate left hip degenerative arthritis noted. Extensive heterotopic ossification surrounds the left hip. Fluoroscopy time: 2 minutes 12 seconds Fluoroscopic images: 6 Dose: Not provided, refer to operative report IMPRESSION: Left femoral ORIF as described above Electronically Signed   By: Fidela Salisbury MD   On: 08/27/2020 21:33    Assessment/Plan: Left subtrochanteric hip fracture in covid positive patient  2 Days Post-Op s/p Procedure(s): INTRAMEDULLARY (IM) NAIL INTERTROCHANTRIC; LEFT  Weightbearing: WBAT LLE, continue PT and OT  Insicional and dressing care: Reinforce dressings as needed VTE prophylaxis: Home eliquis Pain control: continue current regimen Follow - up plan: Dr. Mardelle Matte in 2 weeks   Contact information:   Weekdays 8-5 Merlene Pulling, PA-C [phone] A fter hours and holidays please check [email or web address] for group call information for [facility]  Ventura Bruns 08/29/2020, 8:36 AM

## 2020-08-29 NOTE — Plan of Care (Signed)

## 2020-08-29 NOTE — Progress Notes (Signed)
Occupational Therapy Evaluation Patient Details Name: Kim Orr MRN: [id number] DOB: 1941/01/15 Today's Date: 08/29/2020    History of Present Illness 80 yo female presents to [facility] from [facility] on 7/17 for fall. Pt sustained Acute moderately displaced and angulated subtrochanteric fracture of  the proximal left femur. s/p L femur IMN introtrochantric on 7/18. Recent admission 04/2020 for aflutter with RVR. PMH includes chronic pain syndrome, sciatica, HTN, lymphedema, morbid obesity, HLD, OA, lap chole.   Clinical Impression   Kim Orr was evaluated s/p the above L femur fx. PTA pt was mod I with most ADLs, and reports her husband assists some with bathing and dressing. She if from an ILF, elevator access and 1 level. Upon evaluation pt was max A +2 for all bed mobility. Pt required total A +2 for pericare. Pt demonstrated fair sitting balance EOB, and was max A +2 for scooting. Pt demonstrates some self-limiting behaviors due to anxiety, pain and fear of falling; she appreciates any and all movement to be slow. Pt benefits from skilled OT to progress function in ADLs and mobility. Recommend d/c to SNF.    Follow Up Recommendations  SNF;Supervision/Assistance - 24 hour    Equipment Recommendations  Other (comment) (TBD)       Precautions / Restrictions Precautions Precautions: Fall Restrictions Weight Bearing Restrictions: No LLE Weight Bearing: Weight bearing as tolerated      Mobility Bed Mobility Overal bed mobility: Needs Assistance Bed Mobility: Rolling;Supine to Sit;Sit to Supine Rolling: Max assist;+2 for physical assistance   Supine to sit: Max assist;+2 for physical assistance Sit to supine: Max assist;+2 for physical assistance   General bed mobility comments: max A +2 for all bed mobility, with heavy use of rails and max vc for sequencing    Transfers Overall transfer level: Needs assistance               General transfer comment: deferred this  session, +3 will be beneficial for attempt    Balance Overall balance assessment: Needs assistance;History of Falls Sitting-balance support: Feet supported;Single extremity supported Sitting balance-Leahy Scale: Fair                                     ADL either performed or assessed with clinical judgement   ADL Overall ADL's : Needs assistance/impaired Eating/Feeding: Independent;Bed level   Grooming: Wash/dry hands;Wash/dry face;Oral care;Applying deodorant;Set up;Sitting   Upper Body Bathing: Moderate assistance;Sitting   Lower Body Bathing: Maximal assistance;+2 for physical assistance;+2 for safety/equipment;Bed level   Upper Body Dressing : Set up;Sitting   Lower Body Dressing: Total assistance;+2 for physical assistance;+2 for safety/equipment;Bed level   Toilet Transfer: Total assistance;+2 for physical assistance;+2 for safety/equipment (bed level)   Toileting- Clothing Manipulation and Hygiene: Total assistance;+2 for safety/equipment;+2 for physical assistance       Functional mobility during ADLs: Maximal assistance;+2 for physical assistance;+2 for safety/equipment (bed level)        Pertinent Vitals/Pain Pain Assessment: Faces Faces Pain Scale: Hurts even more Pain Location: LLE Pain Descriptors / Indicators: Sore;Discomfort;Grimacing;Moaning;Crying Pain Intervention(s): Monitored during session     Hand Dominance Right   Extremity/Trunk Assessment Upper Extremity Assessment Upper Extremity Assessment: Generalized weakness   Lower Extremity Assessment Lower Extremity Assessment: Defer to PT evaluation   Cervical / Trunk Assessment Cervical / Trunk Assessment: Other exceptions (increased body habitus)   Communication Communication Communication: No difficulties   Cognition Arousal/Alertness: Awake/alert Behavior  During Therapy: Anxious Overall Cognitive Status: Impaired/Different from baseline Area of Impairment: Following  commands;Awareness;Problem solving                       Following Commands: Follows one step commands inconsistently;Follows one step commands with increased time   Awareness: Emergent Problem Solving: Requires verbal cues;Requires tactile cues;Difficulty sequencing;Decreased initiation General Comments: Pt stalls frequently stating "wait wait wait" and "move me slowly", pt very limited by anxiety and fear of falling.   General Comments  pt has severe edema in bilat legs, a wet blister on L foot, and redness throughout [place] expects to be discharged to:: Skilled nursing facility             Additional Comments: Resides at [place] apt with her husband, on 3rd floor with elevator. Pt's husband uses a RW.      Prior Functioning/Environment Level of Independence: Needs assistance  Gait / Transfers Assistance Needed: Pt gets around apt with rollator, uses w/c for out of room distances and she takes the foot plates off to foot pedal ADL's / Homemaking Assistance Needed: Pt's husband assists with ADLs (including dressing, bathing). Communication / Swallowing Assistance Needed: WFL Comments: Pt reports she uses a hospital bed        OT Problem List: Decreased strength;Decreased range of motion;Decreased activity tolerance;Impaired balance (sitting and/or standing);Decreased safety awareness;Decreased knowledge of use of DME or AE;Decreased knowledge of precautions;Pain      OT Treatment/Interventions: Self-care/ADL training;Therapeutic exercise;DME and/or AE instruction;Therapeutic activities;Patient/family education;Balance training    OT Goals(Current goals can be found in the care plan section) Acute Rehab OT Goals Patient Stated Goal: go home to husband OT Goal Formulation: With patient Time For Goal Achievement: 08/29/20 Potential to Achieve Goals: Fair ADL Goals Pt Will Perform Upper Body Bathing: with set-up;sitting Pt Will  Perform Lower Body Bathing: with min guard assist;sitting/lateral leans Pt Will Perform Upper Body Dressing: with modified independence Pt Will Perform Lower Body Dressing: with min guard assist;sit to/from stand Pt Will Transfer to Toilet: stand pivot transfer;with min guard assist;bedside commode  OT Frequency: Min 2X/week    AM-PAC OT "6 Clicks" Daily Activity     Outcome Measure Help from another person eating meals?: None Help from another person taking care of personal grooming?: A Little Help from another person toileting, which includes using toliet, bedpan, or urinal?: Total Help from another person bathing (including washing, rinsing, drying)?: A Lot Help from another person to put on and taking off regular upper body clothing?: A Little Help from another person to put on and taking off regular lower body clothing?: Total 6 Click Score: 14   End of Session Nurse Communication: Mobility status  Activity Tolerance: Patient limited by pain Patient left: in bed;with call bell/phone within reach;with bed alarm set  OT Visit Diagnosis: Other abnormalities of gait and mobility (R26.89);Repeated falls (R29.6);Muscle weakness (generalized) (M62.81);History of falling (Z91.81);Pain                Time: 8416-6063 OT Time Calculation (min): 53 min Charges:  OT General Charges $OT Visit: 1 Visit OT Evaluation $OT Eval Moderate Complexity: 1 Mod OT Treatments $Self Care/Home Management : 38-52 mins    Kandra Graven A Iridessa Harrow 08/29/2020, 10:53 AM

## 2020-08-29 NOTE — NC FL2 (Signed)
[place] LEVEL OF CARE SCREENING TOOL     IDENTIFICATION  Patient Name: Kim Orr Birthdate: 06/26/1940 Sex: female Admission Date (Current Location): 08/26/2020  [facility] and [place] Number:  Herbalist and Address:  The . [facility], [place] [address], [place], [place] [address]      Provider Number: [id number]  Attending Physician Name and Address:  Dwyane Dee, MD  Relative Name and Phone Number:  Judyann Munson (Daughter)   [phone]    Current Level of Care:   Recommended Level of Care: [place] Prior Approval Number:    Date Approved/Denied:   PASRR Number: [id number]  Discharge Plan: SNF    Current Diagnoses: Patient Active Problem List   Diagnosis Date Noted   COVID-19 virus infection 08/28/2020   Acute respiratory failure with hypoxia ([place]) 08/28/2020   Closed left subtrochanteric femur fracture ([place]) 08/28/2020   Closed left hip fracture, initial encounter ([place]) 08/26/2020   Atrial fibrillation, chronic ([place]) 08/26/2020   Class 2 obesity due to excess calories with body mass index (BMI) of 37.0 to 37.9 in adult 08/26/2020   DNR (do not resuscitate) 08/26/2020   Cardiomyopathy ([place]) 06/18/2020   Moderate mitral regurgitation 06/18/2020   Moderate tricuspid regurgitation 06/18/2020   Atrial fibrillation with RVR ([place]) 05/04/2020   Atrial flutter with rapid ventricular response ([place]) 05/03/2020   Hyperthyroidism 05/03/2020   Sciatica of left side associated with disorder of lumbosacral spine 05/03/2020   Lymphedema of both lower extremities 05/03/2020   Multinodular goiter 06/01/2018   Essential hypertension 07/01/2017   Mixed hyperlipidemia 07/01/2017   Osteoarthritis 07/01/2017   Hemorrhoids 03/16/2017    Orientation RESPIRATION BLADDER Height & Weight     Self, Time, Situation, Place  Other (Comment) (nasal cannula) Incontinent Weight: 269 lb 2.9 oz (122.1 kg) Height:  5\' 11"   (180.3 cm)  BEHAVIORAL SYMPTOMS/MOOD NEUROLOGICAL BOWEL NUTRITION STATUS      Continent Diet (see d/c summary)  AMBULATORY STATUS COMMUNICATION OF NEEDS Skin   Extensive Assist Verbally Surgical wounds                       Personal Care Assistance Level of Assistance  Bathing, Dressing, Feeding Bathing Assistance: Maximum assistance Feeding assistance: Independent Dressing Assistance: Maximum assistance     Functional Limitations Info  Speech, Hearing, Sight Sight Info: Adequate Hearing Info: Adequate Speech Info: Adequate    SPECIAL CARE FACTORS FREQUENCY  PT (By licensed PT), OT (By licensed OT)     PT Frequency: 5x/ week OT Frequency: 5/ week            Contractures Contractures Info: Not present    Additional Factors Info  Code Status, Allergies, Insulin Sliding Scale Code Status Info: DNR Allergies Info: NKA   Insulin Sliding Scale Info: see d/c med list       Current Medications (08/29/2020):  This is the current hospital active medication list Current Facility-Administered Medications  Medication Dose Route Frequency Provider Last Rate Last Admin   acetaminophen (TYLENOL) tablet 650 mg  650 mg Oral Q4H PRN Dwyane Dee, MD       albuterol (VENTOLIN HFA) 108 (90 Base) MCG/ACT inhaler 2 puff  2 puff Inhalation Q2H PRN Merlene Pulling K, PA-C       alum & mag hydroxide-simeth (MAALOX/MYLANTA) 200-200-20 MG/5ML suspension 30 mL  30 mL Oral Q4H PRN Merlene Pulling K, PA-C       apixaban (ELIQUIS) tablet 5 mg  5 mg Oral BID Karren Cobble, [place]   5 mg at 08/29/20 0981   ascorbic acid (VITAMIN C) tablet 500 mg  500 mg Oral Daily Ventura Bruns, PA-C   500 mg at 08/29/20 1914   bisacodyl (DULCOLAX) EC tablet 5 mg  5 mg Oral Daily PRN Merlene Pulling K, PA-C       chlorpheniramine-HYDROcodone (TUSSIONEX) 10-8 MG/5ML suspension 5 mL  5 mL Oral Q12H PRN Merlene Pulling K, PA-C       dexamethasone (DECADRON) injection 10 mg  10 mg Intravenous Q24H Brown, Blaine K,  PA-C   10 mg at 08/28/20 1320   docusate sodium (COLACE) capsule 100 mg  100 mg Oral BID Merlene Pulling K, PA-C   100 mg at 08/28/20 2205   feeding supplement (GLUCERNA SHAKE) (GLUCERNA SHAKE) liquid 237 mL  237 mL Oral BID BM Merlene Pulling K, PA-C       ferrous sulfate tablet 325 mg  325 mg Oral TID PC Brown, Blaine K, PA-C   325 mg at 08/29/20 7829   gabapentin (NEURONTIN) capsule 300 mg  300 mg Oral Daily Ventura Bruns, PA-C   300 mg at 08/29/20 5621   gabapentin (NEURONTIN) capsule 600 mg  600 mg Oral QHS Merlene Pulling K, PA-C   600 mg at 08/28/20 2205   guaiFENesin-dextromethorphan (ROBITUSSIN DM) 100-10 MG/5ML syrup 10 mL  10 mL Oral Q4H PRN Merlene Pulling K, PA-C       insulin aspart (novoLOG) injection 0-15 Units  0-15 Units Subcutaneous TID WC Merlene Pulling K, PA-C   3 Units at 08/28/20 1735   magnesium citrate solution 1 Bottle  1 Bottle Oral Once PRN Merlene Pulling K, PA-C       melatonin tablet 10 mg  10 mg Oral QHS Owens Shark, Blaine K, PA-C   10 mg at 08/28/20 2205   menthol-cetylpyridinium (CEPACOL) lozenge 3 mg  1 lozenge Oral PRN Merlene Pulling K, PA-C       Or   phenol (CHLORASEPTIC) mouth spray 1 spray  1 spray Mouth/Throat PRN Merlene Pulling K, PA-C       methimazole (TAPAZOLE) tablet 5 mg  5 mg Oral Daily Owens Shark, Blaine K, PA-C   5 mg at 08/29/20 3086   methocarbamol (ROBAXIN) tablet 500 mg  500 mg Oral Q6H PRN Merlene Pulling K, PA-C       metoprolol succinate (TOPROL-XL) 24 hr tablet 100 mg  100 mg Oral Daily Merlene Pulling K, PA-C   100 mg at 08/29/20 5784   morphine 2 MG/ML injection 2 mg  2 mg Intravenous Q4H PRN Dwyane Dee, MD   2 mg at 08/28/20 2205   multivitamin with minerals tablet 1 tablet  1 tablet Oral Daily Merlene Pulling K, PA-C   1 tablet at 08/29/20 0937   ondansetron (ZOFRAN) tablet 4 mg  4 mg Oral Q6H PRN Merlene Pulling K, PA-C       Or   ondansetron [facility]) injection 4 mg  4 mg Intravenous Q6H PRN Merlene Pulling K, PA-C       oxyCODONE (Oxy IR/ROXICODONE) immediate  release tablet 5 mg  5 mg Oral Q4H PRN Dwyane Dee, MD       polyethylene glycol (MIRALAX / GLYCOLAX) packet 17 g  17 g Oral Daily PRN Merlene Pulling K, PA-C       protein supplement (ENSURE MAX) liquid  11 oz Oral QHS Merlene Pulling K, PA-C       remdesivir 100 mg  in sodium chloride 0.9 % 100 mL IVPB  100 mg Intravenous Daily Merlene Pulling K, PA-C 200 mL/hr at 08/29/20 1028 100 mg at 08/29/20 1028   senna (SENOKOT) tablet 8.6 mg  1 tablet Oral BID Ventura Bruns, PA-C   8.6 mg at 08/29/20 6950   simvastatin (ZOCOR) tablet 20 mg  20 mg Oral QHS Merlene Pulling K, PA-C   20 mg at 08/28/20 2205   zinc sulfate capsule 220 mg  220 mg Oral Daily Ventura Bruns, PA-C   220 mg at 08/29/20 7225     Discharge Medications: Please see discharge summary for a list of discharge medications.  Relevant Imaging Results:  Relevant Lab Results:   Additional Information [id number];  [place] COVID-19 Vaccine 11/18/2019 , 03/23/2019 , 03/02/2019  Milinda Antis, LCSWA

## 2020-08-29 NOTE — Progress Notes (Addendum)
Progress Note    Kim Orr   RCV:[id number]  DOB: [birth year]  DOA: 08/26/2020     3  PCP: Lajean Manes, MD  CC: fall at home  Hospital Course: [name] is an 80 year old female with HTN, HLD, obesity, hyperthyroidism who presented to the hospital after mechanical fall at home. After evaluation she was found to have an acute moderately displaced and angulated subtrochanteric fracture of the proximal left femur.  She was evaluated by orthopedic surgery and underwent ORIF on 08/27/2020. During evaluation on admission she was also found to be hypoxic and positive for COVID-19.  She was started on remdesivir and Decadron.  She is not typically on oxygen at home.  Interval History:  Ongoing pain this morning which she feels like is interfering with her ability to work with physical therapy.  She was upset and tearful while talking to me.  Provided empathetic listening and reassurance that pain should slowly improve over time.  ROS: Constitutional: negative for chills, fatigue, and fevers, Respiratory: negative for cough, sputum, and wheezing, Cardiovascular: negative for chest pain, and Gastrointestinal: negative for abdominal pain  Assessment & Plan: * Closed left subtrochanteric femur fracture ([place]) - "Acute moderately displaced and angulated subtrochanteric fracture of the proximal left femur." - s/p ORIF on 7/18 with ortho - continue pain control - follow up PT evals: SNF recommended  Acute respiratory failure with hypoxia ([place]) - due to covid - wean O2 as able; weaned to room air on 08/29/2020 - see covid tx  COVID-19 virus infection - continue remdesivir and decadron  -Continue isolation precautions  Hyperthyroidism - continue methimazole   Class 2 obesity due to excess calories with body mass index (BMI) of 37.0 to 37.9 in adult Body mass index is 37.54 kg/m. - contributes to co-morbidities and healing   Atrial fibrillation, chronic (HCC) - on Eliquis  at home - continue metoprolol  Mixed hyperlipidemia - continue statin   Essential hypertension - continue Toprol    Old records reviewed in assessment of this patient  Antimicrobials:   DVT prophylaxis: SCDs Start: 08/28/20 0027 SCDs Start: 08/26/20 1421 apixaban (ELIQUIS) tablet 5 mg   Code Status:   Code Status: DNR Family Communication:   Disposition Plan: Status is: Inpatient  Remains inpatient appropriate because:Unsafe d/c plan, IV treatments appropriate due to intensity of illness or inability to take PO, and Inpatient level of care appropriate due to severity of illness  Dispo: The patient is from: Home              Anticipated d/c is to:  [place]              Patient currently is not medically stable to d/c.   Difficult to place patient Yes  Risk of unplanned readmission score: Unplanned Admission- [place] do not use: 19.62   Objective: Blood pressure 135/60, pulse 84, temperature 98.1 F (36.7 C), temperature source Oral, resp. rate 19, height 5\' 11"  (1.803 m), weight 122.1 kg, SpO2 94 %.  Examination: General appearance: alert, cooperative, and no distress Head: Normocephalic, without obvious abnormality, atraumatic Eyes:  EOMI Lungs: clear to auscultation bilaterally Heart: regular rate and rhythm and S1, S2 normal Abdomen: normal findings: bowel sounds normal and soft, non-tender Extremities:  Left hip surgical dressings in place and compartments soft ; chronic 3+ LE edema with stasis dermatitis in RLE Skin: mobility and turgor normal Neurologic: Left lower extremity strength limited by pain from surgery otherwise no focal deficits  Consultants:  Orthopedic surgery  Procedures:    Data Reviewed: I have personally reviewed following labs and imaging studies Results for orders placed or performed during the hospital encounter of 08/26/20 (from the past 24 hour(s))  Glucose, capillary     Status: Abnormal   Collection Time: 08/28/20 10:02 PM   Result Value Ref Range   Glucose-Capillary 124 (H) 70 - 99 mg/dL  CBC with Differential/Platelet     Status: Abnormal   Collection Time: 08/29/20 12:43 AM  Result Value Ref Range   WBC 8.0 4.0 - 10.5 K/uL   RBC 2.35 (L) 3.87 - 5.11 MIL/uL   Hemoglobin 7.9 (L) 12.0 - 15.0 g/dL   HCT 24.3 (L) 36.0 - 46.0 %   MCV 103.4 (H) 80.0 - 100.0 fL   MCH 33.6 26.0 - 34.0 pg   MCHC 32.5 30.0 - 36.0 g/dL   RDW 13.7 11.5 - 15.5 %   Platelets 184 150 - 400 K/uL   nRBC 0.0 0.0 - 0.2 %   Neutrophils Relative % 80 %   Neutro Abs 6.5 1.7 - 7.7 K/uL   Lymphocytes Relative 9 %   Lymphs Abs 0.7 0.7 - 4.0 K/uL   Monocytes Relative 10 %   Monocytes Absolute 0.8 0.1 - 1.0 K/uL   Eosinophils Relative 0 %   Eosinophils Absolute 0.0 0.0 - 0.5 K/uL   Basophils Relative 0 %   Basophils Absolute 0.0 0.0 - 0.1 K/uL   Immature Granulocytes 1 %   Abs Immature Granulocytes 0.05 0.00 - 0.07 K/uL  Comprehensive metabolic panel     Status: Abnormal   Collection Time: 08/29/20 12:43 AM  Result Value Ref Range   Sodium 136 135 - 145 mmol/L   Potassium 4.2 3.5 - 5.1 mmol/L   Chloride 103 98 - 111 mmol/L   CO2 28 22 - 32 mmol/L   Glucose, Bld 137 (H) 70 - 99 mg/dL   BUN 12 8 - 23 mg/dL   Creatinine, Ser 0.59 0.44 - 1.00 mg/dL   Calcium 8.3 (L) 8.9 - 10.3 mg/dL   Total Protein 4.5 (L) 6.5 - 8.1 g/dL   Albumin 2.7 (L) 3.5 - 5.0 g/dL   AST 35 15 - 41 U/L   ALT 23 0 - 44 U/L   Alkaline Phosphatase 52 38 - 126 U/L   Total Bilirubin 0.6 0.3 - 1.2 mg/dL   GFR, Estimated >60 >60 mL/min   Anion gap 5 5 - 15  C-reactive protein     Status: Abnormal   Collection Time: 08/29/20 12:43 AM  Result Value Ref Range   CRP 6.0 (H) <1.0 mg/dL  D-dimer, quantitative     Status: Abnormal   Collection Time: 08/29/20 12:43 AM  Result Value Ref Range   D-Dimer, Quant 0.73 (H) 0.00 - 0.50 ug/mL-FEU  Ferritin     Status: None   Collection Time: 08/29/20 12:43 AM  Result Value Ref Range   Ferritin 207 11 - 307 ng/mL  Magnesium      Status: None   Collection Time: 08/29/20 12:43 AM  Result Value Ref Range   Magnesium 2.1 1.7 - 2.4 mg/dL  Glucose, capillary     Status: None   Collection Time: 08/29/20  7:09 AM  Result Value Ref Range   Glucose-Capillary 89 70 - 99 mg/dL  Glucose, capillary     Status: Abnormal   Collection Time: 08/29/20 12:06 PM  Result Value Ref Range   Glucose-Capillary 101 (H) 70 - 99 mg/dL  Glucose, capillary  Status: None   Collection Time: 08/29/20  4:15 PM  Result Value Ref Range   Glucose-Capillary 78 70 - 99 mg/dL    Recent Results (from the past 240 hour(s))  Resp Panel by RT-PCR (Flu A&B, Covid) Nasopharyngeal Swab     Status: Abnormal   Collection Time: 08/26/20 12:35 PM   Specimen: Nasopharyngeal Swab; Nasopharyngeal(NP) swabs in vial transport medium  Result Value Ref Range Status   SARS Coronavirus 2 by RT PCR POSITIVE (A) NEGATIVE Final    Comment: RESULT CALLED TO, READ BACK BY AND VERIFIED WITH: RN E.BANKS ON [id number] AT 1339 BY E.PARRISH (NOTE) SARS-CoV-2 target nucleic acids are DETECTED.  The SARS-CoV-2 RNA is generally detectable in upper respiratory specimens during the acute phase of infection. Positive results are indicative of the presence of the identified virus, but do not rule out bacterial infection or co-infection with other pathogens not detected by the test. Clinical correlation with patient history and other diagnostic information is necessary to determine patient infection status. The expected result is Negative.  Fact Sheet for Patients: EntrepreneurPulse.com.au  Fact Sheet for Healthcare Providers: IncredibleEmployment.be  This test is not yet approved or cleared by the [place] FDA and  has been authorized for detection and/or diagnosis of SARS-CoV-2 by FDA under an Emergency Use Authorization (EUA).  This EUA will remain in effect (meaning this t est can be used) for the duration of  the COVID-19  declaration under Section 564(b)(1) of the Act, 21 U.S.C. section 360bbb-3(b)(1), unless the authorization is terminated or revoked sooner.     Influenza A by PCR NEGATIVE NEGATIVE Final   Influenza B by PCR NEGATIVE NEGATIVE Final    Comment: (NOTE) The Xpert Xpress SARS-CoV-2/FLU/RSV plus assay is intended as an aid in the diagnosis of influenza from Nasopharyngeal swab specimens and should not be used as a sole basis for treatment. Nasal washings and aspirates are unacceptable for Xpert Xpress SARS-CoV-2/FLU/RSV testing.  Fact Sheet for Patients: EntrepreneurPulse.com.au  Fact Sheet for Healthcare Providers: IncredibleEmployment.be  This test is not yet approved or cleared by the [place] FDA and has been authorized for detection and/or diagnosis of SARS-CoV-2 by FDA under an Emergency Use Authorization (EUA). This EUA will remain in effect (meaning this test can be used) for the duration of the COVID-19 declaration under Section 564(b)(1) of the Act, 21 U.S.C. section 360bbb-3(b)(1), unless the authorization is terminated or revoked.  Performed at [facility], [place] [address], [place], [place] [address]   Surgical pcr screen     Status: None   Collection Time: 08/27/20  9:01 AM   Specimen: Nasal Mucosa; Nasal Swab  Result Value Ref Range Status   MRSA, PCR NEGATIVE NEGATIVE Final   Staphylococcus aureus NEGATIVE NEGATIVE Final    Comment: (NOTE) The Xpert SA Assay (FDA approved for NASAL specimens in patients 76 years of age and older), is one component of a comprehensive surveillance program. It is not intended to diagnose infection nor to guide or monitor treatment. Performed at [facility], [place] [address] [place], [place] [address]      Radiology Studies: DG C-Arm 1-60 Min  Result Date: 08/27/2020 CLINICAL DATA:  Left femoral ORIF EXAM: LEFT FEMUR 2 VIEWS; DG C-ARM 1-60 MIN COMPARISON:  11:29 a.m. FINDINGS:  Six fluoroscopic intraoperative radiographs of the left hip demonstrate ORIF of a subtrochanteric femoral fracture utilizing a intramedullary rod with proximal and distal interlocking screws. Fracture fragments are in grossly anatomic alignment on this  limited examination. No unexpected fracture or dislocation. Moderate left hip degenerative arthritis noted. Extensive heterotopic ossification surrounds the left hip. Fluoroscopy time: 2 minutes 12 seconds Fluoroscopic images: 6 Dose: Not provided, refer to operative report IMPRESSION: Left femoral ORIF as described above Electronically Signed   By: Fidela Salisbury MD   On: 08/27/2020 21:33   DG FEMUR MIN 2 VIEWS LEFT  Result Date: 08/27/2020 CLINICAL DATA:  Left femoral ORIF EXAM: LEFT FEMUR 2 VIEWS; DG C-ARM 1-60 MIN COMPARISON:  11:29 a.m. FINDINGS: Six fluoroscopic intraoperative radiographs of the left hip demonstrate ORIF of a subtrochanteric femoral fracture utilizing a intramedullary rod with proximal and distal interlocking screws. Fracture fragments are in grossly anatomic alignment on this limited examination. No unexpected fracture or dislocation. Moderate left hip degenerative arthritis noted. Extensive heterotopic ossification surrounds the left hip. Fluoroscopy time: 2 minutes 12 seconds Fluoroscopic images: 6 Dose: Not provided, refer to operative report IMPRESSION: Left femoral ORIF as described above Electronically Signed   By: Fidela Salisbury MD   On: 08/27/2020 21:33   DG FEMUR MIN 2 VIEWS LEFT  Final Result    DG C-Arm 1-60 Min  Final Result    DG CHEST PORT 1 VIEW  Final Result    CT Head Wo Contrast  Final Result    DG Hip Unilat With Pelvis 2-3 Views Left  Final Result    DG Pelvis Portable    (Results Pending)  DG FEMUR PORT MIN 2 VIEWS LEFT    (Results Pending)    Scheduled Meds:  apixaban  5 mg Oral BID   vitamin C  500 mg Oral Daily   dexamethasone (DECADRON) injection  10 mg Intravenous Q24H   docusate sodium  100  mg Oral BID   feeding supplement (GLUCERNA SHAKE)  237 mL Oral BID BM   ferrous sulfate  325 mg Oral TID PC   gabapentin  300 mg Oral Daily   gabapentin  600 mg Oral QHS   insulin aspart  0-15 Units Subcutaneous TID WC   melatonin  10 mg Oral QHS   methimazole  5 mg Oral Daily   metoprolol succinate  100 mg Oral Daily   multivitamin with minerals  1 tablet Oral Daily   Ensure Max Protein  11 oz Oral QHS   senna  1 tablet Oral BID   simvastatin  20 mg Oral QHS   zinc sulfate  220 mg Oral Daily   PRN Meds: acetaminophen, albuterol, alum & mag hydroxide-simeth, bisacodyl, chlorpheniramine-HYDROcodone, guaiFENesin-dextromethorphan, menthol-cetylpyridinium **OR** phenol, methocarbamol **OR** [DISCONTINUED] methocarbamol (ROBAXIN) IV, morphine injection, ondansetron **OR** ondansetron (ZOFRAN) IV, oxyCODONE, polyethylene glycol Continuous Infusions:  remdesivir 100 mg in NS 100 mL 100 mg (08/29/20 1028)     LOS: 3 days  Time spent: Greater than 50% of the 35 minute visit was spent in counseling/coordination of care for the patient as laid out in the A&P.   Dwyane Dee, MD [facility] 08/29/2020, 4:32 PM

## 2020-08-29 NOTE — TOC CAGE-AID Note (Signed)
Transition of Care [facility]) - CAGE-AID Screening   Patient Details  Name: Kim Orr MRN: [id number] Date of Birth: 1940-09-16  Transition of Care [facility]) CM/SW Contact:    [name], LCSWA Phone Number: 08/29/2020, 2:40 PM   Clinical Narrative: Pt participated in [place].  Pt stated she did not use substance or ETOH.  Pt was not offered resources, due to pt not using substance or ETOH.    [name], MSW, LCSW-A Pronouns:  She/Her/Hers [facility] Clinical Social Worker Direct Number:  [phone] [name]   CAGE-AID Screening:    Have You Ever Felt You Ought to Cut Down on Your Drinking or Drug Use?: No Have People Annoyed You By [employer] Your Drinking Or Drug Use?: No Have You Felt Bad Or Guilty About Your Drinking Or Drug Use?: No Have You Ever Had a Drink or Used Drugs First Thing In The Morning to Steady Your Nerves or to Get Rid of a Hangover?: No CAGE-AID Score: 0  Substance Abuse Education Offered: No

## 2020-08-30 ENCOUNTER — Inpatient Hospital Stay (HOSPITAL_COMMUNITY): Payer: Medicare HMO

## 2020-08-30 DIAGNOSIS — S7222XA Displaced subtrochanteric fracture of left femur, initial encounter for closed fracture: Secondary | ICD-10-CM | POA: Diagnosis not present

## 2020-08-30 DIAGNOSIS — R52 Pain, unspecified: Secondary | ICD-10-CM | POA: Diagnosis not present

## 2020-08-30 DIAGNOSIS — J9601 Acute respiratory failure with hypoxia: Secondary | ICD-10-CM | POA: Diagnosis not present

## 2020-08-30 LAB — CBC WITH DIFFERENTIAL/PLATELET
Abs Immature Granulocytes: 0.14 10*3/uL — ABNORMAL HIGH (ref 0.00–0.07)
Basophils Absolute: 0 10*3/uL (ref 0.0–0.1)
Basophils Relative: 0 %
Eosinophils Absolute: 0 10*3/uL (ref 0.0–0.5)
Eosinophils Relative: 0 %
HCT: 27 % — ABNORMAL LOW (ref 36.0–46.0)
Hemoglobin: 8.9 g/dL — ABNORMAL LOW (ref 12.0–15.0)
Immature Granulocytes: 2 %
Lymphocytes Relative: 11 %
Lymphs Abs: 0.9 10*3/uL (ref 0.7–4.0)
MCH: 33.6 pg (ref 26.0–34.0)
MCHC: 33 g/dL (ref 30.0–36.0)
MCV: 101.9 fL — ABNORMAL HIGH (ref 80.0–100.0)
Monocytes Absolute: 0.8 10*3/uL (ref 0.1–1.0)
Monocytes Relative: 9 %
Neutro Abs: 6.9 10*3/uL (ref 1.7–7.7)
Neutrophils Relative %: 78 %
Platelets: 187 10*3/uL (ref 150–400)
RBC: 2.65 MIL/uL — ABNORMAL LOW (ref 3.87–5.11)
RDW: 13.8 % (ref 11.5–15.5)
WBC: 8.8 10*3/uL (ref 4.0–10.5)
nRBC: 0.3 % — ABNORMAL HIGH (ref 0.0–0.2)

## 2020-08-30 LAB — C-REACTIVE PROTEIN: CRP: 6.3 mg/dL — ABNORMAL HIGH (ref <1.0)

## 2020-08-30 LAB — COMPREHENSIVE METABOLIC PANEL
ALT: 19 U/L (ref 0–44)
AST: 28 U/L (ref 15–41)
Albumin: 2.7 g/dL — ABNORMAL LOW (ref 3.5–5.0)
Alkaline Phosphatase: 56 U/L (ref 38–126)
Anion gap: 5 (ref 5–15)
BUN: 11 mg/dL (ref 8–23)
CO2: 30 mmol/L (ref 22–32)
Calcium: 8.6 mg/dL — ABNORMAL LOW (ref 8.9–10.3)
Chloride: 101 mmol/L (ref 98–111)
Creatinine, Ser: 0.62 mg/dL (ref 0.44–1.00)
GFR, Estimated: >60 mL/min (ref >60)
Glucose, Bld: 148 mg/dL — ABNORMAL HIGH (ref 70–99)
Potassium: 4.4 mmol/L (ref 3.5–5.1)
Sodium: 136 mmol/L (ref 135–145)
Total Bilirubin: 0.8 mg/dL (ref 0.3–1.2)
Total Protein: 4.8 g/dL — ABNORMAL LOW (ref 6.5–8.1)

## 2020-08-30 LAB — D-DIMER, QUANTITATIVE: D-Dimer, Quant: 0.42 ug/mL-FEU (ref 0.00–0.50)

## 2020-08-30 LAB — GLUCOSE, CAPILLARY
Glucose-Capillary: 102 mg/dL — ABNORMAL HIGH (ref 70–99)
Glucose-Capillary: 101 mg/dL — ABNORMAL HIGH (ref 70–99)
Glucose-Capillary: 157 mg/dL — ABNORMAL HIGH (ref 70–99)
Glucose-Capillary: 162 mg/dL — ABNORMAL HIGH (ref 70–99)

## 2020-08-30 LAB — MAGNESIUM: Magnesium: 2.2 mg/dL (ref 1.7–2.4)

## 2020-08-30 LAB — FERRITIN: Ferritin: 154 ng/mL (ref 11–307)

## 2020-08-30 MED ORDER — MORPHINE SULFATE (PF) 4 MG/ML IV SOLN
4.0000 mg | INTRAVENOUS | Status: DC | PRN
  Administered 2020-08-30 – 2020-08-31 (×3): 4 mg via INTRAVENOUS
  Filled 2020-08-30 (×3): qty 1

## 2020-08-30 MED ORDER — SENNOSIDES-DOCUSATE SODIUM 8.6-50 MG PO TABS
1.0000 | ORAL_TABLET | Freq: Two times a day (BID) | ORAL | Status: DC
  Administered 2020-08-30 – 2020-09-02 (×7): 1 via ORAL
  Filled 2020-08-30 (×8): qty 1

## 2020-08-30 MED ORDER — ACETAMINOPHEN 500 MG PO TABS
1000.0000 mg | ORAL_TABLET | Freq: Four times a day (QID) | ORAL | Status: DC
  Administered 2020-08-30 – 2020-09-04 (×16): 1000 mg via ORAL
  Filled 2020-08-30 (×17): qty 2

## 2020-08-30 MED ORDER — ALBUMIN HUMAN 25 % IV SOLN
25.0000 g | Freq: Four times a day (QID) | INTRAVENOUS | Status: AC
  Administered 2020-08-30 – 2020-09-01 (×8): 25 g via INTRAVENOUS
  Filled 2020-08-30 (×8): qty 100

## 2020-08-30 MED ORDER — OXYCODONE HCL 5 MG PO TABS
10.0000 mg | ORAL_TABLET | ORAL | Status: DC | PRN
  Administered 2020-08-31 – 2020-09-04 (×5): 10 mg via ORAL
  Filled 2020-08-30 (×5): qty 2

## 2020-08-30 MED ORDER — FERROUS SULFATE 325 (65 FE) MG PO TABS
325.0000 mg | ORAL_TABLET | Freq: Every day | ORAL | Status: DC
  Administered 2020-08-31 – 2020-09-04 (×4): 325 mg via ORAL
  Filled 2020-08-30 (×4): qty 1

## 2020-08-30 MED ORDER — DEXAMETHASONE 6 MG PO TABS
6.0000 mg | ORAL_TABLET | Freq: Every day | ORAL | Status: DC
  Administered 2020-08-30 – 2020-09-04 (×5): 6 mg via ORAL
  Filled 2020-08-30 (×6): qty 1

## 2020-08-30 MED ORDER — FUROSEMIDE 10 MG/ML IJ SOLN
60.0000 mg | Freq: Every day | INTRAMUSCULAR | Status: DC
  Administered 2020-08-30 – 2020-09-02 (×4): 60 mg via INTRAVENOUS
  Filled 2020-08-30 (×4): qty 6

## 2020-08-30 NOTE — Progress Notes (Signed)
Progress Note    Kim Orr   PNT:[id number]  DOB: 03-26-1940  DOA: 08/26/2020     4  PCP: Lajean Manes, MD  CC: fall at home  Hospital Course: [name] is an 80 year old female with HTN, HLD, obesity, hyperthyroidism who presented to the hospital after mechanical fall at home. After evaluation she was found to have an acute moderately displaced and angulated subtrochanteric fracture of the proximal left femur.  She was evaluated by orthopedic surgery and underwent ORIF on 08/27/2020. During evaluation on admission she was also found to be hypoxic and positive for COVID-19.  She was started on remdesivir and Decadron.  She is not typically on oxygen at home.  Interval History:  Declining some therapies and interventions this morning.  Also made a few comments along the lines of wishing she could just die.  Discussed this further in detail with her during my rounds; she has no true suicidal ideation etc. (see below). She is dealing with acute pain issues from her fracture and surgery.  We discussed further adjustment of her pain regimen today and I also called and updated her daughter on the phone after rounding on patient.  ROS: Constitutional: negative for chills, fatigue, and fevers, Respiratory: negative for cough, sputum, and wheezing, Cardiovascular: negative for chest pain, and Gastrointestinal: negative for abdominal pain  Assessment & Plan: * Closed left subtrochanteric femur fracture ([place]) - "Acute moderately displaced and angulated subtrochanteric fracture of the proximal left femur." - s/p ORIF on 7/18 with ortho - continue pain control - follow up PT evals: SNF recommended  Acute pain - not on chronic opioids at home; database reviewed as well - pain seems to be 2/2 femur fx and surgery; hope is that it should slowly improve over time - due to her pain, she has refused some therapies and also made comments to staff about "ready to die" and variations of  that. Personally discussed with patient on 7/21. She does not have true suicidal ideation nor has there be any attempt, therefore has no technical means either; her distraught is driven by pain and her regimen is being further adjusted to try and help with this - also called and discussed this with her daughter, [name], on 7/21  Acute respiratory failure with hypoxia ([place]) - Presumed due to [place] initially however given significant immobility, body habitus, and recent surgery she may have a component of compressive atelectasis as well as possible underlying OSA/OHS -At this point, she may start to become oxygen dependent until her mobility can significantly improve which was poor prior to admission or her fracture already - wean O2 as able; seems to require oxygen still intermittently.  This can easily be arranged for continuing at discharge as she is starting to develop more need for it once again  COVID-19 virus infection - continue remdesivir (5 days completed 7/21) and decadron x 10 days -Continue isolation precautions  Hyperthyroidism - continue methimazole   Class 2 obesity due to excess calories with body mass index (BMI) of 37.0 to 37.9 in adult Body mass index is 37.54 kg/m. - contributes to co-morbidities and healing   Atrial fibrillation, chronic (HCC) - on Eliquis at home - continue metoprolol  Mixed hyperlipidemia - continue statin   Essential hypertension - continue Toprol    Old records reviewed in assessment of this patient  Antimicrobials:   DVT prophylaxis: SCDs Start: 08/28/20 0027 SCDs Start: 08/26/20 1421 apixaban (ELIQUIS) tablet 5 mg   Code Status:  Code Status: DNR Family Communication: Daughter, [name]  Disposition Plan: Status is: Inpatient  Remains inpatient appropriate because:Unsafe d/c plan, IV treatments appropriate due to intensity of illness or inability to take PO, and Inpatient level of care appropriate due to severity of  illness  Dispo: The patient is from: Home              Anticipated d/c is to:  [facility]              Patient currently is not medically stable to d/c.   Difficult to place patient Yes  Risk of unplanned readmission score: Unplanned Admission- Pilot do not use: 19.85   Objective: Blood pressure 131/68, pulse 71, temperature 98.4 F (36.9 C), temperature source Oral, resp. rate 17, height 5\' 11"  (1.803 m), weight 122.1 kg, SpO2 94 %.  Examination: General appearance: alert, cooperative, and no distress; tearful and noting pain Head: Normocephalic, without obvious abnormality, atraumatic Eyes:  EOMI Lungs: clear to auscultation bilaterally Heart: regular rate and rhythm and S1, S2 normal Abdomen: normal findings: bowel sounds normal and soft, non-tender Extremities:  Left hip surgical dressings in place and compartments soft ; chronic 3+ LE edema with stasis dermatitis in RLE Skin: mobility and turgor normal Neurologic: Left lower extremity strength limited by pain from surgery otherwise no focal deficits  Consultants:  Orthopedic surgery  Procedures:    Data Reviewed: I have personally reviewed following labs and imaging studies Results for orders placed or performed during the hospital encounter of 08/26/20 (from the past 24 hour(s))  Glucose, capillary     Status: None   Collection Time: 08/29/20  4:15 PM  Result Value Ref Range   Glucose-Capillary 78 70 - 99 mg/dL  Glucose, capillary     Status: Abnormal   Collection Time: 08/29/20  9:17 PM  Result Value Ref Range   Glucose-Capillary 163 (H) 70 - 99 mg/dL  CBC with Differential/Platelet     Status: Abnormal   Collection Time: 08/30/20  2:41 AM  Result Value Ref Range   WBC 8.8 4.0 - 10.5 K/uL   RBC 2.65 (L) 3.87 - 5.11 MIL/uL   Hemoglobin 8.9 (L) 12.0 - 15.0 g/dL   HCT 27.0 (L) 36.0 - 46.0 %   MCV 101.9 (H) 80.0 - 100.0 fL   MCH 33.6 26.0 - 34.0 pg   MCHC 33.0 30.0 - 36.0 g/dL   RDW 13.8 11.5 - 15.5 %   Platelets  187 150 - 400 K/uL   nRBC 0.3 (H) 0.0 - 0.2 %   Neutrophils Relative % 78 %   Neutro Abs 6.9 1.7 - 7.7 K/uL   Lymphocytes Relative 11 %   Lymphs Abs 0.9 0.7 - 4.0 K/uL   Monocytes Relative 9 %   Monocytes Absolute 0.8 0.1 - 1.0 K/uL   Eosinophils Relative 0 %   Eosinophils Absolute 0.0 0.0 - 0.5 K/uL   Basophils Relative 0 %   Basophils Absolute 0.0 0.0 - 0.1 K/uL   Immature Granulocytes 2 %   Abs Immature Granulocytes 0.14 (H) 0.00 - 0.07 K/uL  Comprehensive metabolic panel     Status: Abnormal   Collection Time: 08/30/20  2:41 AM  Result Value Ref Range   Sodium 136 135 - 145 mmol/L   Potassium 4.4 3.5 - 5.1 mmol/L   Chloride 101 98 - 111 mmol/L   CO2 30 22 - 32 mmol/L   Glucose, Bld 148 (H) 70 - 99 mg/dL   BUN 11 8 - 23  mg/dL   Creatinine, Ser 0.62 0.44 - 1.00 mg/dL   Calcium 8.6 (L) 8.9 - 10.3 mg/dL   Total Protein 4.8 (L) 6.5 - 8.1 g/dL   Albumin 2.7 (L) 3.5 - 5.0 g/dL   AST 28 15 - 41 U/L   ALT 19 0 - 44 U/L   Alkaline Phosphatase 56 38 - 126 U/L   Total Bilirubin 0.8 0.3 - 1.2 mg/dL   GFR, Estimated >60 >60 mL/min   Anion gap 5 5 - 15  C-reactive protein     Status: Abnormal   Collection Time: 08/30/20  2:41 AM  Result Value Ref Range   CRP 6.3 (H) <1.0 mg/dL  D-dimer, quantitative     Status: None   Collection Time: 08/30/20  2:41 AM  Result Value Ref Range   D-Dimer, Quant 0.42 0.00 - 0.50 ug/mL-FEU  Ferritin     Status: None   Collection Time: 08/30/20  2:41 AM  Result Value Ref Range   Ferritin 154 11 - 307 ng/mL  Magnesium     Status: None   Collection Time: 08/30/20  2:41 AM  Result Value Ref Range   Magnesium 2.2 1.7 - 2.4 mg/dL  Glucose, capillary     Status: Abnormal   Collection Time: 08/30/20  8:11 AM  Result Value Ref Range   Glucose-Capillary 102 (H) 70 - 99 mg/dL  Glucose, capillary     Status: Abnormal   Collection Time: 08/30/20 11:36 AM  Result Value Ref Range   Glucose-Capillary 101 (H) 70 - 99 mg/dL    Recent Results (from the past  240 hour(s))  Resp Panel by RT-PCR (Flu A&B, Covid) Nasopharyngeal Swab     Status: Abnormal   Collection Time: 08/26/20 12:35 PM   Specimen: Nasopharyngeal Swab; Nasopharyngeal(NP) swabs in vial transport medium  Result Value Ref Range Status   SARS Coronavirus 2 by RT PCR POSITIVE (A) NEGATIVE Final    Comment: RESULT CALLED TO, READ BACK BY AND VERIFIED WITH: RN E.BANKS ON [id number] AT 1339 BY E.PARRISH (NOTE) SARS-CoV-2 target nucleic acids are DETECTED.  The SARS-CoV-2 RNA is generally detectable in upper respiratory specimens during the acute phase of infection. Positive results are indicative of the presence of the identified virus, but do not rule out bacterial infection or co-infection with other pathogens not detected by the test. Clinical correlation with patient history and other diagnostic information is necessary to determine patient infection status. The expected result is Negative.  Fact Sheet for Patients: EntrepreneurPulse.com.au  Fact Sheet for Healthcare Providers: IncredibleEmployment.be  This test is not yet approved or cleared by the [place] FDA and  has been authorized for detection and/or diagnosis of SARS-CoV-2 by FDA under an Emergency Use Authorization (EUA).  This EUA will remain in effect (meaning this t est can be used) for the duration of  the COVID-19 declaration under Section 564(b)(1) of the Act, 21 U.S.C. section 360bbb-3(b)(1), unless the authorization is terminated or revoked sooner.     Influenza A by PCR NEGATIVE NEGATIVE Final   Influenza B by PCR NEGATIVE NEGATIVE Final    Comment: (NOTE) The Xpert Xpress SARS-CoV-2/FLU/RSV plus assay is intended as an aid in the diagnosis of influenza from Nasopharyngeal swab specimens and should not be used as a sole basis for treatment. Nasal washings and aspirates are unacceptable for Xpert Xpress SARS-CoV-2/FLU/RSV testing.  Fact Sheet for  Patients: EntrepreneurPulse.com.au  Fact Sheet for Healthcare Providers: IncredibleEmployment.be  This test is not yet approved or cleared by  the [employer] and has been authorized for detection and/or diagnosis of SARS-CoV-2 by FDA under an Emergency Use Authorization (EUA). This EUA will remain in effect (meaning this test can be used) for the duration of the COVID-19 declaration under Section 564(b)(1) of the Act, 21 U.S.C. section 360bbb-3(b)(1), unless the authorization is terminated or revoked.  Performed at [facility], [place] [address], [place], [place] [address]   Surgical pcr screen     Status: None   Collection Time: 08/27/20  9:01 AM   Specimen: Nasal Mucosa; Nasal Swab  Result Value Ref Range Status   MRSA, PCR NEGATIVE NEGATIVE Final   Staphylococcus aureus NEGATIVE NEGATIVE Final    Comment: (NOTE) The Xpert SA Assay (FDA approved for NASAL specimens in patients 18 years of age and older), is one component of a comprehensive surveillance program. It is not intended to diagnose infection nor to guide or monitor treatment. Performed at [facility], [place] [address] [place], [place] [address]      Radiology Studies: No results found. DG FEMUR MIN 2 VIEWS LEFT  Final Result    DG C-Arm 1-60 Min  Final Result    DG CHEST PORT 1 VIEW  Final Result    CT Head Wo Contrast  Final Result    DG Hip Unilat With Pelvis 2-3 Views Left  Final Result    DG Pelvis Portable    (Results Pending)  DG FEMUR PORT MIN 2 VIEWS LEFT    (Results Pending)    Scheduled Meds:  acetaminophen  1,000 mg Oral QID   apixaban  5 mg Oral BID   vitamin C  500 mg Oral Daily   dexamethasone  6 mg Oral Daily   docusate sodium  100 mg Oral BID   feeding supplement (GLUCERNA SHAKE)  237 mL Oral BID BM   [START ON 08/31/2020] ferrous sulfate  325 mg Oral Q breakfast   furosemide  60 mg Intravenous Daily   gabapentin  300 mg Oral  Daily   gabapentin  600 mg Oral QHS   insulin aspart  0-15 Units Subcutaneous TID WC   melatonin  10 mg Oral QHS   methimazole  5 mg Oral Daily   metoprolol succinate  100 mg Oral Daily   multivitamin with minerals  1 tablet Oral Daily   Ensure Max Protein  11 oz Oral QHS   senna-docusate  1 tablet Oral BID   simvastatin  20 mg Oral QHS   zinc sulfate  220 mg Oral Daily   PRN Meds: albuterol, alum & mag hydroxide-simeth, bisacodyl, guaiFENesin-dextromethorphan, menthol-cetylpyridinium **OR** phenol, methocarbamol **OR** [DISCONTINUED] methocarbamol (ROBAXIN) IV, morphine injection, ondansetron **OR** ondansetron (ZOFRAN) IV, oxyCODONE, polyethylene glycol Continuous Infusions:  albumin human 25 g (08/30/20 1244)     LOS: 4 days  Time spent: Greater than 50% of the 35 minute visit was spent in counseling/coordination of care for the patient as laid out in the A&P.   Dwyane Dee, MD [facility] 08/30/2020, 2:09 PM

## 2020-08-30 NOTE — Progress Notes (Signed)
Physical Therapy Treatment Patient Details Name: Kim Orr MRN: [id number] DOB: [birth year] Today's Date: 08/30/2020    History of Present Illness 80 yo female presents to [facility] from [facility] on 7/17 for fall. Pt sustained Acute moderately displaced and angulated subtrochanteric fracture of  the proximal left femur. s/p L femur IMN introtrochantric on 7/18. Recent admission 04/2020 for aflutter with RVR. PMH includes chronic pain syndrome, sciatica, HTN, lymphedema, morbid obesity, HLD, OA, lap chole.    PT Comments    Pt tearful throughout session, states "I want to die, I am ready to die, just leave me alone". When PT asked if she wanted to talk to someone in the hospital about this, pt states "I want to see God". MD informed, PT recommending palliative consult.   Pt required total +2 assist for rolling and repositioning in bed, focus of session of pressure relief and attempted LE exercises, pt with very limited tolerance. PT resistant to all attempts to place lift pad for lift OOB to recliner. PT to continue to try to progress mobility.    Follow Up Recommendations  SNF;Supervision/Assistance - 24 hour     Equipment Recommendations  None recommended by PT    Recommendations for Other Services       Precautions / Restrictions Precautions Precautions: Fall Restrictions Weight Bearing Restrictions: No LLE Weight Bearing: Weight bearing as tolerated    Mobility  Bed Mobility Overal bed mobility: Needs Assistance Bed Mobility: Rolling Rolling: +2 for physical assistance;Total assist         General bed mobility comments: total +2 for roll to R for all aspects, pt resistant to movement. PT placed offweighting pillow under buttocks to protect skin integrity.    Transfers                 General transfer comment: PT brought in maximove and lift pad, attempted pull to sit and rolling for lift pad placement and pt screaming, resistant to  this.  Ambulation/Gait                 Stairs             Wheelchair Mobility    Modified Rankin (Stroke Patients Only)       Balance                                            Cognition Arousal/Alertness: Awake/alert Behavior During Therapy: Anxious (tearful) Overall Cognitive Status: Impaired/Different from baseline Area of Impairment: Following commands;Awareness;Problem solving;Safety/judgement                   Current Attention Level: Sustained   Following Commands: Follows one step commands inconsistently;Follows one step commands with increased time Safety/Judgement: Decreased awareness of safety Awareness: Intellectual Problem Solving: Requires verbal cues;Requires tactile cues;Difficulty sequencing;Decreased initiation General Comments: Pt tearful throughout session, states "I want to die, I am ready to die, just leave me alone". Pt very limited by pain and anxiety.      Exercises General Exercises - Lower Extremity Quad Sets: AROM;Both;10 reps;Supine Heel Slides: PROM;Left;Supine (x2, limited by pain)    General Comments General comments (skin integrity, edema, etc.): blistering to L dorsum of foot, swelling and rubor bilat LEs      Pertinent Vitals/Pain Pain Assessment: Faces Faces Pain Scale: Hurts whole lot Pain Location: LLE, back Pain Descriptors / Indicators: Sore;Discomfort;Grimacing;Moaning;Crying Pain  Intervention(s): Limited activity within patient's tolerance;Monitored during session;Repositioned    Home Living                      Prior Function            PT Goals (current goals can now be found in the care plan section) Progress towards PT goals: Not progressing toward goals - comment (anxiety, crying stating "I want to die")    Frequency    Min 3X/week      PT Plan Current plan remains appropriate    Co-evaluation              AM-PAC PT "6 Clicks" Mobility   Outcome  Measure  Help needed turning from your back to your side while in a flat bed without using bedrails?: Total Help needed moving from lying on your back to sitting on the side of a flat bed without using bedrails?: Total Help needed moving to and from a bed to a chair (including a wheelchair)?: Total Help needed standing up from a chair using your arms (e.g., wheelchair or bedside chair)?: Total Help needed to walk in hospital room?: Total Help needed climbing 3-5 steps with a railing? : Total 6 Click Score: 6    End of Session Equipment Utilized During Treatment: Oxygen Activity Tolerance: Patient limited by pain;Other (comment) (anxiety/depression) Patient left: in bed;with call bell/phone within reach;with bed alarm set;with nursing/sitter in room Nurse Communication: Mobility status;Other (comment) (recommendation for palliative consult) PT Visit Diagnosis: Other abnormalities of gait and mobility (R26.89);Muscle weakness (generalized) (M62.81);Pain Pain - Right/Left: Left Pain - part of body: Leg;Hip     Time: 1010-1030 PT Time Calculation (min) (ACUTE ONLY): 20 min  Charges:  $Therapeutic Activity: 8-22 mins                    Kim Orr, PT DPT Acute Rehabilitation Services Pager [phone]  Office [phone]    Kim Orr 08/30/2020, 12:58 PM

## 2020-08-30 NOTE — Assessment & Plan Note (Addendum)
-   not on chronic opioids at home; database reviewed as well - pain seems to be 2/2 femur fx and surgery; hope is that it should slowly improve over time - due to her pain, she has refused some therapies and also made comments to staff about "ready to die" and variations of that. Personally discussed with patient on 7/21. She does not have true suicidal ideation nor has there be any attempt, therefore has no technical means either; her distraught is driven by pain and her regimen is being further adjusted to try and help with this - also called and discussed this with her daughter, [name], on 7/21 - pain remained much improved the following days with no further conversations such as above

## 2020-08-30 NOTE — Progress Notes (Signed)
Subjective: 3 Days Post-Op s/p Procedure(s): INTRAMEDULLARY (IM) NAIL INTERTROCHANTRIC; LEFT   Patient is alert, oriented. Siting up in bed. Tearful, states she's "ready to go", she's already said goodbye to her family. She feels like she is going to die from this injury. No left hip pain at rest. Severe hip pain with movement. She's frustrated that she cannot turn herself well in the bed.  Denies chest pain, SOB, Calf pain. No nausea/vomiting.  Objective:  PE: VITALS:   Vitals:   08/29/20 1206 08/29/20 2125 08/30/20 0513 08/30/20 0813  BP: 135/60 (!) 150/65 127/60 131/68  Pulse: 84 86 69 71  Resp: 19 19 18 17   Temp: 98.1 F (36.7 C) 97.9 F (36.6 C) 98.5 F (36.9 C) 98.4 F (36.9 C)  TempSrc: Oral Oral Oral Oral  SpO2: 94% 91% 94% 94%  Weight:      Height:         MSK: LLE - dressings CDI. Distal sensation intact. Dorsiflexion and plantarflexion intact. + DP pulse. Blistering on dorsal foot, no change since post op Day 1. No TTP to lateral left proximal thigh. Moderate TTP to left lateral distal femur.   LABS  Results for orders placed or performed during the hospital encounter of 08/26/20 (from the past 24 hour(s))  Glucose, capillary     Status: Abnormal   Collection Time: 08/29/20 12:06 PM  Result Value Ref Range   Glucose-Capillary 101 (H) 70 - 99 mg/dL  Glucose, capillary     Status: None   Collection Time: 08/29/20  4:15 PM  Result Value Ref Range   Glucose-Capillary 78 70 - 99 mg/dL  Glucose, capillary     Status: Abnormal   Collection Time: 08/29/20  9:17 PM  Result Value Ref Range   Glucose-Capillary 163 (H) 70 - 99 mg/dL  CBC with Differential/Platelet     Status: Abnormal   Collection Time: 08/30/20  2:41 AM  Result Value Ref Range   WBC 8.8 4.0 - 10.5 K/uL   RBC 2.65 (L) 3.87 - 5.11 MIL/uL   Hemoglobin 8.9 (L) 12.0 - 15.0 g/dL   HCT 27.0 (L) 36.0 - 46.0 %   MCV 101.9 (H) 80.0 - 100.0 fL   MCH 33.6 26.0 - 34.0 pg   MCHC 33.0 30.0 - 36.0 g/dL    RDW 13.8 11.5 - 15.5 %   Platelets 187 150 - 400 K/uL   nRBC 0.3 (H) 0.0 - 0.2 %   Neutrophils Relative % 78 %   Neutro Abs 6.9 1.7 - 7.7 K/uL   Lymphocytes Relative 11 %   Lymphs Abs 0.9 0.7 - 4.0 K/uL   Monocytes Relative 9 %   Monocytes Absolute 0.8 0.1 - 1.0 K/uL   Eosinophils Relative 0 %   Eosinophils Absolute 0.0 0.0 - 0.5 K/uL   Basophils Relative 0 %   Basophils Absolute 0.0 0.0 - 0.1 K/uL   Immature Granulocytes 2 %   Abs Immature Granulocytes 0.14 (H) 0.00 - 0.07 K/uL  Comprehensive metabolic panel     Status: Abnormal   Collection Time: 08/30/20  2:41 AM  Result Value Ref Range   Sodium 136 135 - 145 mmol/L   Potassium 4.4 3.5 - 5.1 mmol/L   Chloride 101 98 - 111 mmol/L   CO2 30 22 - 32 mmol/L   Glucose, Bld 148 (H) 70 - 99 mg/dL   BUN 11 8 - 23 mg/dL   Creatinine, Ser 0.62 0.44 - 1.00 mg/dL  Calcium 8.6 (L) 8.9 - 10.3 mg/dL   Total Protein 4.8 (L) 6.5 - 8.1 g/dL   Albumin 2.7 (L) 3.5 - 5.0 g/dL   AST 28 15 - 41 U/L   ALT 19 0 - 44 U/L   Alkaline Phosphatase 56 38 - 126 U/L   Total Bilirubin 0.8 0.3 - 1.2 mg/dL   GFR, Estimated >60 >60 mL/min   Anion gap 5 5 - 15  C-reactive protein     Status: Abnormal   Collection Time: 08/30/20  2:41 AM  Result Value Ref Range   CRP 6.3 (H) <1.0 mg/dL  D-dimer, quantitative     Status: None   Collection Time: 08/30/20  2:41 AM  Result Value Ref Range   D-Dimer, Quant 0.42 0.00 - 0.50 ug/mL-FEU  Ferritin     Status: None   Collection Time: 08/30/20  2:41 AM  Result Value Ref Range   Ferritin 154 11 - 307 ng/mL  Magnesium     Status: None   Collection Time: 08/30/20  2:41 AM  Result Value Ref Range   Magnesium 2.2 1.7 - 2.4 mg/dL  Glucose, capillary     Status: Abnormal   Collection Time: 08/30/20  8:11 AM  Result Value Ref Range   Glucose-Capillary 102 (H) 70 - 99 mg/dL    No results found.  Assessment/Plan: Principal Problem:   Closed left subtrochanteric femur fracture (HCC) Active Problems:   Essential  hypertension   Mixed hyperlipidemia   Hyperthyroidism   Atrial fibrillation, chronic (HCC)   Class 2 obesity due to excess calories with body mass index (BMI) of 37.0 to 37.9 in adult   COVID-19 virus infection   Acute respiratory failure with hypoxia (HCC)  3 Days Post-Op s/p Procedure(s): INTRAMEDULLARY (IM) NAIL INTERTROCHANTRIC; LEFT Had long discussion with patient regarding working on mobility today with PT. Spoke with nursing about pre-medicating prior to PT. Hopefully she can spend some time sitting at end of bed today, if not, ideally she could be hoyer lifted into chair for some time sitting and out of bed today.  Weightbearing: WBAT LLE, continue PT and OT  Insicional and dressing care: Reinforce dressings as needed. Will plan to change tomorrow.  VTE prophylaxis: Home eliquis, Hbg stable Pain control: continue current regimen Follow - up plan: Dr. Mardelle Matte in 2 weeks   Contact information:   Weekdays 8-5 Merlene Pulling, PA-C [phone] A fter hours and holidays please check [email or web address] for group call information for [facility]  Ventura Bruns 08/30/2020, 9:54 AM

## 2020-08-30 NOTE — Progress Notes (Signed)
Pt declined having an x-ray done. She is stating she cant move, doesn't want to live anymore, and cant live like this. Pain meds were given for pain, but pt is very emotional and wanting to give up on everything.

## 2020-08-30 NOTE — Progress Notes (Signed)
Nutrition Follow-up  DOCUMENTATION CODES:   Obesity unspecified  INTERVENTION:   -D/c Glucerna shake -D/c Ensure Max -Continue MVI with minerals daily -Magic cup TID with meals, each supplement provides 290 kcal and 9 grams of protein   NUTRITION DIAGNOSIS:   Increased nutrient needs related to post-op healing as evidenced by estimated needs.  Ongoing  GOAL:   Patient will meet greater than or equal to 90% of their needs  Progressing   MONITOR:   PO intake, Supplement acceptance, Diet advancement, Labs, Weight trends, Skin, I & O's  REASON FOR ASSESSMENT:   Consult Assessment of nutrition requirement/status, Hip fracture protocol  ASSESSMENT:   80 year old female with HTN, HLD, obesity, hyper thyroidism, comes into the hospital with a fall.  This happened while walking to the bathroom, has been complained of weakness as well as a sore throat and coughing for the last few days.  In the ED she was found to have a hip fracture and she was also COVID-positive.  Orthopedic surgery consulted  7/18- s/p PROCEDURE:  INTRAMEDULLARY (IM) NAIL INTERTROCHANTRIC  Reviewed I/O's: -400 ml x 24 hours and +1.4 L since admission  UOP: 400 ml x 24 hours  Pt sleeping soundly at time of visit. She did not respond to name being called.  Pt with fair oral intake. Noted meal completion 50%. She is refusing Glucerna ans ENsure Max supplements.   Medications reviewed and include vitamin C, ferrous sulfate, and remdesivir.  Per therapy notes, recommend SNF at discharge.   Labs reviewed: CBGS: 78-163 (inpatient orders for glycemic control are 0-15 units insulin aspart TID with meals).    Diet Order:   Diet Order             Diet regular Room service appropriate? Yes; Fluid consistency: Thin  Diet effective now                   EDUCATION NEEDS:   No education needs have been identified at this time  Skin:  Skin Assessment: Skin Integrity Issues: Skin Integrity Issues::  Incisions Incisions: closed lt hip  Last BM:  08/26/20  Height:   Ht Readings from Last 1 Encounters:  08/26/20 5\' 11"  (1.803 m)    Weight:   Wt Readings from Last 1 Encounters:  08/26/20 122.1 kg    Ideal Body Weight:  70.5 kg  BMI:  Body mass index is 37.54 kg/m.  Estimated Nutritional Needs:   Kcal:  2100-2300  Protein:  125-140 grams  Fluid:  > 2 L    Loistine Chance, RD, LDN, [place] Registered Dietitian II Certified Diabetes Care and Education Specialist Please refer to [facility] for RD and/or RD on-call/weekend/after hours pager

## 2020-08-31 DIAGNOSIS — S7222XA Displaced subtrochanteric fracture of left femur, initial encounter for closed fracture: Secondary | ICD-10-CM | POA: Diagnosis not present

## 2020-08-31 LAB — CBC WITH DIFFERENTIAL/PLATELET
Abs Immature Granulocytes: 0.16 10*3/uL — ABNORMAL HIGH (ref 0.00–0.07)
Basophils Absolute: 0 10*3/uL (ref 0.0–0.1)
Basophils Relative: 0 %
Eosinophils Absolute: 0 10*3/uL (ref 0.0–0.5)
Eosinophils Relative: 0 %
HCT: 24.8 % — ABNORMAL LOW (ref 36.0–46.0)
Hemoglobin: 8.3 g/dL — ABNORMAL LOW (ref 12.0–15.0)
Immature Granulocytes: 2 %
Lymphocytes Relative: 15 %
Lymphs Abs: 1.3 10*3/uL (ref 0.7–4.0)
MCH: 33.9 pg (ref 26.0–34.0)
MCHC: 33.5 g/dL (ref 30.0–36.0)
MCV: 101.2 fL — ABNORMAL HIGH (ref 80.0–100.0)
Monocytes Absolute: 0.8 10*3/uL (ref 0.1–1.0)
Monocytes Relative: 10 %
Neutro Abs: 6.1 10*3/uL (ref 1.7–7.7)
Neutrophils Relative %: 73 %
Platelets: 209 10*3/uL (ref 150–400)
RBC: 2.45 MIL/uL — ABNORMAL LOW (ref 3.87–5.11)
RDW: 13.6 % (ref 11.5–15.5)
WBC: 8.3 10*3/uL (ref 4.0–10.5)
nRBC: 0.7 % — ABNORMAL HIGH (ref 0.0–0.2)

## 2020-08-31 LAB — COMPREHENSIVE METABOLIC PANEL
ALT: 15 U/L (ref 0–44)
AST: 20 U/L (ref 15–41)
Albumin: 3.4 g/dL — ABNORMAL LOW (ref 3.5–5.0)
Alkaline Phosphatase: 45 U/L (ref 38–126)
Anion gap: 7 (ref 5–15)
BUN: 15 mg/dL (ref 8–23)
CO2: 31 mmol/L (ref 22–32)
Calcium: 9 mg/dL (ref 8.9–10.3)
Chloride: 100 mmol/L (ref 98–111)
Creatinine, Ser: 0.6 mg/dL (ref 0.44–1.00)
GFR, Estimated: >60 mL/min (ref >60)
Glucose, Bld: 124 mg/dL — ABNORMAL HIGH (ref 70–99)
Potassium: 3.7 mmol/L (ref 3.5–5.1)
Sodium: 138 mmol/L (ref 135–145)
Total Bilirubin: 1.1 mg/dL (ref 0.3–1.2)
Total Protein: 5.3 g/dL — ABNORMAL LOW (ref 6.5–8.1)

## 2020-08-31 LAB — GLUCOSE, CAPILLARY
Glucose-Capillary: 115 mg/dL — ABNORMAL HIGH (ref 70–99)
Glucose-Capillary: 98 mg/dL (ref 70–99)
Glucose-Capillary: 177 mg/dL — ABNORMAL HIGH (ref 70–99)

## 2020-08-31 LAB — FERRITIN: Ferritin: 131 ng/mL (ref 11–307)

## 2020-08-31 LAB — D-DIMER, QUANTITATIVE: D-Dimer, Quant: 0.89 ug/mL-FEU — ABNORMAL HIGH (ref 0.00–0.50)

## 2020-08-31 LAB — C-REACTIVE PROTEIN: CRP: 3.1 mg/dL — ABNORMAL HIGH (ref <1.0)

## 2020-08-31 LAB — MAGNESIUM: Magnesium: 2.2 mg/dL (ref 1.7–2.4)

## 2020-08-31 MED ORDER — LACTULOSE 10 GM/15ML PO SOLN
20.0000 g | Freq: Two times a day (BID) | ORAL | Status: DC
  Administered 2020-08-31 – 2020-09-02 (×5): 20 g via ORAL
  Filled 2020-08-31 (×7): qty 30

## 2020-08-31 MED ORDER — POLYETHYLENE GLYCOL 3350 17 G PO PACK
17.0000 g | PACK | Freq: Every day | ORAL | Status: DC
  Administered 2020-08-31 – 2020-09-02 (×3): 17 g via ORAL
  Filled 2020-08-31 (×3): qty 1

## 2020-08-31 NOTE — Progress Notes (Signed)
Progress Note    Kim Orr   [id number]  DOB: [birth year]  DOA: 08/26/2020     5  PCP: Lajean Manes, MD  CC: fall at home  Hospital Course: Kim Orr is an 80 year old female with HTN, HLD, obesity, hyperthyroidism who presented to the hospital after mechanical fall at home. After evaluation she was found to have an acute moderately displaced and angulated subtrochanteric fracture of the proximal left femur.  She was evaluated by orthopedic surgery and underwent ORIF on 08/27/2020. During evaluation on admission she was also found to be hypoxic and positive for COVID-19.  She was started on remdesivir and Decadron.  She is not typically on oxygen at home.  Interval History:  No events overnight.  Mood is a little better this morning.  No longer saying that she wishes she would just die. Pain a little better today.  Encouraged her to try and get out of bed today if physical therapy comes to work with her.  ROS: Constitutional: negative for chills, fatigue, and fevers, Respiratory: negative for cough, sputum, and wheezing, Cardiovascular: negative for chest pain, and Gastrointestinal: negative for abdominal pain  Assessment & Plan: * Closed left subtrochanteric femur fracture ([place]) - "Acute moderately displaced and angulated subtrochanteric fracture of the proximal left femur." - s/p ORIF on 7/18 with ortho - continue pain control - follow up PT evals: SNF recommended  Acute pain - not on chronic opioids at home; database reviewed as well - pain seems to be 2/2 femur fx and surgery; hope is that it should slowly improve over time - due to her pain, she has refused some therapies and also made comments to staff about "ready to die" and variations of that. Personally discussed with patient on 7/21. She does not have true suicidal ideation nor has there be any attempt, therefore has no technical means either; her distraught is driven by pain and her regimen is being  further adjusted to try and help with this - also called and discussed this with her daughter, Kim Orr, on 7/21  Acute respiratory failure with hypoxia ([place]) - Presumed due to [place] initially however given significant immobility, body habitus, and recent surgery she may have a component of compressive atelectasis as well as possible underlying OSA/OHS -At this point, she may start to become oxygen dependent until her mobility can significantly improve which was poor prior to admission or her fracture already - wean O2 as able; seems to require oxygen still intermittently.  This can easily be arranged for continuing at discharge as she is starting to develop more need for it once again  COVID-19 virus infection - continue remdesivir (5 days completed 7/21) and decadron x 10 days -Continue isolation precautions  Hyperthyroidism - continue methimazole   Class 2 obesity due to excess calories with body mass index (BMI) of 37.0 to 37.9 in adult Body mass index is 37.54 kg/m. - contributes to co-morbidities and healing   Atrial fibrillation, chronic (HCC) - on Eliquis at home - continue metoprolol  Mixed hyperlipidemia - continue statin   Essential hypertension - continue Toprol    Old records reviewed in assessment of this patient  Antimicrobials:   DVT prophylaxis: SCDs Start: 08/28/20 0027 SCDs Start: 08/26/20 1421 apixaban (ELIQUIS) tablet 5 mg   Code Status:   Code Status: DNR Family Communication: Daughter, Kim Orr  Disposition Plan: Status is: Inpatient  Remains inpatient appropriate because:Unsafe d/c plan, IV treatments appropriate due to intensity of illness or inability to take PO,  and Inpatient level of care appropriate due to severity of illness  Dispo: The patient is from: Home              Anticipated d/c is to:  [place]              Patient currently is not medically stable to d/c.   Difficult to place patient Yes  Risk of unplanned readmission score:  Unplanned Admission- Pilot do not use: 17.45   Objective: Blood pressure 106/63, pulse 79, temperature 97.9 F (36.6 C), temperature source Oral, resp. rate 17, height '5\' 11"'$  (1.803 m), weight 122.1 kg, SpO2 99 %.  Examination: General appearance: alert, cooperative, and no distress; tearful and noting pain Head: Normocephalic, without obvious abnormality, atraumatic Eyes:  EOMI Lungs: clear to auscultation bilaterally Heart: regular rate and rhythm and S1, S2 normal Abdomen: normal findings: bowel sounds normal and soft, non-tender Extremities:  Left hip surgical dressings in place and compartments soft ; chronic 3+ LE edema with stasis dermatitis in RLE Skin: mobility and turgor normal Neurologic: Left lower extremity strength limited by pain from surgery otherwise no focal deficits  Consultants:  Orthopedic surgery  Procedures:    Data Reviewed: I have personally reviewed following labs and imaging studies Results for orders placed or performed during the hospital encounter of 08/26/20 (from the past 24 hour(s))  Glucose, capillary     Status: Abnormal   Collection Time: 08/30/20  5:02 PM  Result Value Ref Range   Glucose-Capillary 157 (H) 70 - 99 mg/dL  Glucose, capillary     Status: Abnormal   Collection Time: 08/30/20  9:13 PM  Result Value Ref Range   Glucose-Capillary 162 (H) 70 - 99 mg/dL  CBC with Differential/Platelet     Status: Abnormal   Collection Time: 08/31/20  4:21 AM  Result Value Ref Range   WBC 8.3 4.0 - 10.5 K/uL   RBC 2.45 (L) 3.87 - 5.11 MIL/uL   Hemoglobin 8.3 (L) 12.0 - 15.0 g/dL   HCT 24.8 (L) 36.0 - 46.0 %   MCV 101.2 (H) 80.0 - 100.0 fL   MCH 33.9 26.0 - 34.0 pg   MCHC 33.5 30.0 - 36.0 g/dL   RDW 13.6 11.5 - 15.5 %   Platelets 209 150 - 400 K/uL   nRBC 0.7 (H) 0.0 - 0.2 %   Neutrophils Relative % 73 %   Neutro Abs 6.1 1.7 - 7.7 K/uL   Lymphocytes Relative 15 %   Lymphs Abs 1.3 0.7 - 4.0 K/uL   Monocytes Relative 10 %   Monocytes Absolute  0.8 0.1 - 1.0 K/uL   Eosinophils Relative 0 %   Eosinophils Absolute 0.0 0.0 - 0.5 K/uL   Basophils Relative 0 %   Basophils Absolute 0.0 0.0 - 0.1 K/uL   Immature Granulocytes 2 %   Abs Immature Granulocytes 0.16 (H) 0.00 - 0.07 K/uL  Comprehensive metabolic panel     Status: Abnormal   Collection Time: 08/31/20  4:21 AM  Result Value Ref Range   Sodium 138 135 - 145 mmol/L   Potassium 3.7 3.5 - 5.1 mmol/L   Chloride 100 98 - 111 mmol/L   CO2 31 22 - 32 mmol/L   Glucose, Bld 124 (H) 70 - 99 mg/dL   BUN 15 8 - 23 mg/dL   Creatinine, Ser 0.60 0.44 - 1.00 mg/dL   Calcium 9.0 8.9 - 10.3 mg/dL   Total Protein 5.3 (L) 6.5 - 8.1 g/dL   Albumin 3.4 (  L) 3.5 - 5.0 g/dL   AST 20 15 - 41 U/L   ALT 15 0 - 44 U/L   Alkaline Phosphatase 45 38 - 126 U/L   Total Bilirubin 1.1 0.3 - 1.2 mg/dL   GFR, Estimated >60 >60 mL/min   Anion gap 7 5 - 15  C-reactive protein     Status: Abnormal   Collection Time: 08/31/20  4:21 AM  Result Value Ref Range   CRP 3.1 (H) <1.0 mg/dL  D-dimer, quantitative     Status: Abnormal   Collection Time: 08/31/20  4:21 AM  Result Value Ref Range   D-Dimer, Quant 0.89 (H) 0.00 - 0.50 ug/mL-FEU  Ferritin     Status: None   Collection Time: 08/31/20  4:21 AM  Result Value Ref Range   Ferritin 131 11 - 307 ng/mL  Magnesium     Status: None   Collection Time: 08/31/20  4:21 AM  Result Value Ref Range   Magnesium 2.2 1.7 - 2.4 mg/dL  Glucose, capillary     Status: Abnormal   Collection Time: 08/31/20 11:45 AM  Result Value Ref Range   Glucose-Capillary 115 (H) 70 - 99 mg/dL  Glucose, capillary     Status: None   Collection Time: 08/31/20  4:10 PM  Result Value Ref Range   Glucose-Capillary 98 70 - 99 mg/dL    Recent Results (from the past 240 hour(s))  Resp Panel by RT-PCR (Flu A&B, Covid) Nasopharyngeal Swab     Status: Abnormal   Collection Time: 08/26/20 12:35 PM   Specimen: Nasopharyngeal Swab; Nasopharyngeal(NP) swabs in vial transport medium  Result  Value Ref Range Status   SARS Coronavirus 2 by RT PCR POSITIVE (A) NEGATIVE Final    Comment: RESULT CALLED TO, READ BACK BY AND VERIFIED WITH: RN E.BANKS ON [id number] AT 1339 BY E.PARRISH (NOTE) SARS-CoV-2 target nucleic acids are DETECTED.  The SARS-CoV-2 RNA is generally detectable in upper respiratory specimens during the acute phase of infection. Positive results are indicative of the presence of the identified virus, but do not rule out bacterial infection or co-infection with other pathogens not detected by the test. Clinical correlation with patient history and other diagnostic information is necessary to determine patient infection status. The expected result is Negative.  Fact Sheet for Patients: EntrepreneurPulse.com.au  Fact Sheet for Healthcare Providers: IncredibleEmployment.be  This test is not yet approved or cleared by the [place] FDA and  has been authorized for detection and/or diagnosis of SARS-CoV-2 by FDA under an Emergency Use Authorization (EUA).  This EUA will remain in effect (meaning this t est can be used) for the duration of  the COVID-19 declaration under Section 564(b)(1) of the Act, 21 U.S.C. section 360bbb-3(b)(1), unless the authorization is terminated or revoked sooner.     Influenza A by PCR NEGATIVE NEGATIVE Final   Influenza B by PCR NEGATIVE NEGATIVE Final    Comment: (NOTE) The Xpert Xpress SARS-CoV-2/FLU/RSV plus assay is intended as an aid in the diagnosis of influenza from Nasopharyngeal swab specimens and should not be used as a sole basis for treatment. Nasal washings and aspirates are unacceptable for Xpert Xpress SARS-CoV-2/FLU/RSV testing.  Fact Sheet for Patients: EntrepreneurPulse.com.au  Fact Sheet for Healthcare Providers: IncredibleEmployment.be  This test is not yet approved or cleared by the [place] FDA and has been authorized for  detection and/or diagnosis of SARS-CoV-2 by FDA under an Emergency Use Authorization (EUA). This EUA will remain in effect (meaning this test can be  used) for the duration of the COVID-19 declaration under Section 564(b)(1) of the Act, 21 U.S.C. section 360bbb-3(b)(1), unless the authorization is terminated or revoked.  Performed at [facility], [place] [address], [place], [place] [address]   Surgical pcr screen     Status: None   Collection Time: 08/27/20  9:01 AM   Specimen: Nasal Mucosa; Nasal Swab  Result Value Ref Range Status   MRSA, PCR NEGATIVE NEGATIVE Final   Staphylococcus aureus NEGATIVE NEGATIVE Final    Comment: (NOTE) The Xpert SA Assay (FDA approved for NASAL specimens in patients 56 years of age and older), is one component of a comprehensive surveillance program. It is not intended to diagnose infection nor to guide or monitor treatment. Performed at [facility], [place] [address] [place], [place] [address]      Radiology Studies: DG Pelvis Portable  Result Date: 08/30/2020 CLINICAL DATA:  Left hip fracture status post ORIF EXAM: PORTABLE PELVIS 1-2 VIEWS COMPARISON:  08/27/2020, 08/26/2020 FINDINGS: Single frontal view of the pelvis demonstrates interval placement of an intramedullary rod and proximal dynamic screw traversing the comminuted proximal left femoral fracture seen previously. Alignment is near anatomic. Overlying soft tissue swelling consistent with postsurgical change. IMPRESSION: 1. ORIF of a proximal left femoral fracture with near anatomic alignment. Electronically Signed   By: Randa Ngo M.D.   On: 08/30/2020 21:06   DG FEMUR PORT MIN 2 VIEWS LEFT  Result Date: 08/30/2020 CLINICAL DATA:  Left hip ORIF EXAM: LEFT FEMUR PORTABLE 2 VIEWS COMPARISON:  08/27/2019 FINDINGS: Frontal and cross-table lateral views of the left femur are obtained. Intramedullary rod with proximal dynamic and distal interlocking screw traverses a comminuted  proximal left femur fracture. Alignment is near anatomic. Postsurgical changes in the overlying soft tissues. IMPRESSION: 1. ORIF of a proximal left femoral fracture as above. Electronically Signed   By: Randa Ngo M.D.   On: 08/30/2020 21:06   DG Pelvis Portable  Final Result    DG FEMUR PORT MIN 2 VIEWS LEFT  Final Result    DG FEMUR MIN 2 VIEWS LEFT  Final Result    DG C-Arm 1-60 Min  Final Result    DG CHEST PORT 1 VIEW  Final Result    CT Head Wo Contrast  Final Result    DG Hip Unilat With Pelvis 2-3 Views Left  Final Result      Scheduled Meds:  acetaminophen  1,000 mg Oral QID   apixaban  5 mg Oral BID   vitamin C  500 mg Oral Daily   dexamethasone  6 mg Oral Daily   docusate sodium  100 mg Oral BID   ferrous sulfate  325 mg Oral Q breakfast   furosemide  60 mg Intravenous Daily   gabapentin  300 mg Oral Daily   gabapentin  600 mg Oral QHS   insulin aspart  0-15 Units Subcutaneous TID WC   lactulose  20 g Oral BID   melatonin  10 mg Oral QHS   methimazole  5 mg Oral Daily   metoprolol succinate  100 mg Oral Daily   multivitamin with minerals  1 tablet Oral Daily   polyethylene glycol  17 g Oral Daily   senna-docusate  1 tablet Oral BID   simvastatin  20 mg Oral QHS   zinc sulfate  220 mg Oral Daily   PRN Meds: albuterol, alum & mag hydroxide-simeth, bisacodyl, guaiFENesin-dextromethorphan, menthol-cetylpyridinium **OR** phenol, methocarbamol **OR** [DISCONTINUED] methocarbamol (ROBAXIN) IV, morphine  injection, ondansetron **OR** ondansetron (ZOFRAN) IV, oxyCODONE Continuous Infusions:  albumin human 25 g (08/31/20 1120)     LOS: 5 days  Time spent: Greater than 50% of the 35 minute visit was spent in counseling/coordination of care for the patient as laid out in the A&P.   Dwyane Dee, MD [facility] 08/31/2020, 4:26 PM

## 2020-08-31 NOTE — Progress Notes (Signed)
Subjective: 4 Days Post-Op s/p Procedure(s): INTRAMEDULLARY (IM) NAIL INTERTROCHANTRIC; LEFT   Patient is alert, oriented.  Patient reports pain as mild this morning. Feels like her pain is significantly improved from yesterday. Mood significantly improved from yesterday. Feels she ready to work on getting out of bed today. No other complaints.     Objective:  PE: VITALS:   Vitals:   08/30/20 0813 08/30/20 1700 08/30/20 2057 08/31/20 0828  BP: 131/68 103/62 (!) 108/45 (!) 124/54  Pulse: 71 70 72 66  Resp: '17 17 18 18  '$ Temp: 98.4 F (36.9 C) 98.7 F (37.1 C) 97.9 F (36.6 C) 98.6 F (37 C)  TempSrc: Oral Oral Oral Oral  SpO2: 94% 93% 95% 93%  Weight:      Height:       MSK: LLE - dressings CDI. Distal sensation intact. Dorsiflexion and plantarflexion intact. + DP pulse, foot warm and well perfused. Blistering on dorsal foot. No TTP to lateral left proximal thigh. No TTP to left lateral distal femur.   LABS  Results for orders placed or performed during the hospital encounter of 08/26/20 (from the past 24 hour(s))  Glucose, capillary     Status: Abnormal   Collection Time: 08/30/20 11:36 AM  Result Value Ref Range   Glucose-Capillary 101 (H) 70 - 99 mg/dL  Glucose, capillary     Status: Abnormal   Collection Time: 08/30/20  5:02 PM  Result Value Ref Range   Glucose-Capillary 157 (H) 70 - 99 mg/dL  Glucose, capillary     Status: Abnormal   Collection Time: 08/30/20  9:13 PM  Result Value Ref Range   Glucose-Capillary 162 (H) 70 - 99 mg/dL  CBC with Differential/Platelet     Status: Abnormal   Collection Time: 08/31/20  4:21 AM  Result Value Ref Range   WBC 8.3 4.0 - 10.5 K/uL   RBC 2.45 (L) 3.87 - 5.11 MIL/uL   Hemoglobin 8.3 (L) 12.0 - 15.0 g/dL   HCT 24.8 (L) 36.0 - 46.0 %   MCV 101.2 (H) 80.0 - 100.0 fL   MCH 33.9 26.0 - 34.0 pg   MCHC 33.5 30.0 - 36.0 g/dL   RDW 13.6 11.5 - 15.5 %   Platelets 209 150 - 400 K/uL   nRBC 0.7 (H) 0.0 - 0.2 %   Neutrophils  Relative % 73 %   Neutro Abs 6.1 1.7 - 7.7 K/uL   Lymphocytes Relative 15 %   Lymphs Abs 1.3 0.7 - 4.0 K/uL   Monocytes Relative 10 %   Monocytes Absolute 0.8 0.1 - 1.0 K/uL   Eosinophils Relative 0 %   Eosinophils Absolute 0.0 0.0 - 0.5 K/uL   Basophils Relative 0 %   Basophils Absolute 0.0 0.0 - 0.1 K/uL   Immature Granulocytes 2 %   Abs Immature Granulocytes 0.16 (H) 0.00 - 0.07 K/uL  Comprehensive metabolic panel     Status: Abnormal   Collection Time: 08/31/20  4:21 AM  Result Value Ref Range   Sodium 138 135 - 145 mmol/L   Potassium 3.7 3.5 - 5.1 mmol/L   Chloride 100 98 - 111 mmol/L   CO2 31 22 - 32 mmol/L   Glucose, Bld 124 (H) 70 - 99 mg/dL   BUN 15 8 - 23 mg/dL   Creatinine, Ser 0.60 0.44 - 1.00 mg/dL   Calcium 9.0 8.9 - 10.3 mg/dL   Total Protein 5.3 (L) 6.5 - 8.1 g/dL   Albumin 3.4 (L) 3.5 -  5.0 g/dL   AST 20 15 - 41 U/L   ALT 15 0 - 44 U/L   Alkaline Phosphatase 45 38 - 126 U/L   Total Bilirubin 1.1 0.3 - 1.2 mg/dL   GFR, Estimated >60 >60 mL/min   Anion gap 7 5 - 15  C-reactive protein     Status: Abnormal   Collection Time: 08/31/20  4:21 AM  Result Value Ref Range   CRP 3.1 (H) <1.0 mg/dL  D-dimer, quantitative     Status: Abnormal   Collection Time: 08/31/20  4:21 AM  Result Value Ref Range   D-Dimer, Quant 0.89 (H) 0.00 - 0.50 ug/mL-FEU  Ferritin     Status: None   Collection Time: 08/31/20  4:21 AM  Result Value Ref Range   Ferritin 131 11 - 307 ng/mL  Magnesium     Status: None   Collection Time: 08/31/20  4:21 AM  Result Value Ref Range   Magnesium 2.2 1.7 - 2.4 mg/dL    DG Pelvis Portable  Result Date: 08/30/2020 CLINICAL DATA:  Left hip fracture status post ORIF EXAM: PORTABLE PELVIS 1-2 VIEWS COMPARISON:  08/27/2020, 08/26/2020 FINDINGS: Single frontal view of the pelvis demonstrates interval placement of an intramedullary rod and proximal dynamic screw traversing the comminuted proximal left femoral fracture seen previously. Alignment is  near anatomic. Overlying soft tissue swelling consistent with postsurgical change. IMPRESSION: 1. ORIF of a proximal left femoral fracture with near anatomic alignment. Electronically Signed   By: Randa Ngo M.D.   On: 08/30/2020 21:06   DG FEMUR PORT MIN 2 VIEWS LEFT  Result Date: 08/30/2020 CLINICAL DATA:  Left hip ORIF EXAM: LEFT FEMUR PORTABLE 2 VIEWS COMPARISON:  08/27/2019 FINDINGS: Frontal and cross-table lateral views of the left femur are obtained. Intramedullary rod with proximal dynamic and distal interlocking screw traverses a comminuted proximal left femur fracture. Alignment is near anatomic. Postsurgical changes in the overlying soft tissues. IMPRESSION: 1. ORIF of a proximal left femoral fracture as above. Electronically Signed   By: Randa Ngo M.D.   On: 08/30/2020 21:06    Assessment/Plan: Principal Problem:   Closed left subtrochanteric femur fracture (HCC) Active Problems:   Essential hypertension   Mixed hyperlipidemia   Hyperthyroidism   Atrial fibrillation, chronic (HCC)   Class 2 obesity due to excess calories with body mass index (BMI) of 37.0 to 37.9 in adult   COVID-19 virus infection   Acute respiratory failure with hypoxia (HCC)   Acute pain   4 Days Post-Op s/p Procedure(s): INTRAMEDULLARY (IM) NAIL INTERTROCHANTRIC; LEFT - Doing significantly better this morning, she feels like her pain is better controlled and she is ready to increase working with PT, would like to sit in a chair today Weightbearing: WBAT LLE, continue PT and OT  Insicional and dressing care: Reinforce dressings as needed. Will plan to change tomorrow.  VTE prophylaxis: Home eliquis, Hbg stable Pain control: continue current regimen Follow - up plan: Dr. Mardelle Matte in 2 weeks Will D/c to SNF when medically able   Contact information:   Weekdays 8-5 [address], [place] [phone] A fter hours and holidays please check [email or web address] for group call information for [facility]  Group  Ventura Bruns 08/31/2020, 8:40 AM

## 2020-08-31 NOTE — Progress Notes (Signed)
Spoke to pt;s dtr [name] regarding pt's therapy sessions. All questions and concerns were fully addressed/

## 2020-09-01 LAB — GLUCOSE, CAPILLARY
Glucose-Capillary: 108 mg/dL — ABNORMAL HIGH (ref 70–99)
Glucose-Capillary: 144 mg/dL — ABNORMAL HIGH (ref 70–99)
Glucose-Capillary: 163 mg/dL — ABNORMAL HIGH (ref 70–99)
Glucose-Capillary: 120 mg/dL — ABNORMAL HIGH (ref 70–99)

## 2020-09-01 MED ORDER — MORPHINE SULFATE (PF) 2 MG/ML IV SOLN: 2.0000 mg | INTRAVENOUS | Status: DC | PRN

## 2020-09-01 MED ORDER — DICLOFENAC SODIUM 1 % EX GEL
2.0000 g | Freq: Four times a day (QID) | CUTANEOUS | Status: DC | PRN
  Administered 2020-09-01 – 2020-09-02 (×3): 2 g via TOPICAL
  Filled 2020-09-01: qty 100

## 2020-09-01 NOTE — Plan of Care (Signed)

## 2020-09-01 NOTE — Progress Notes (Addendum)
Subjective: 5 Days Post-Op s/p Procedure(s): INTRAMEDULLARY (IM) NAIL INTERTROCHANTRIC; LEFT   Patient is alert, oriented, sitting up in bed. Patient complaining of low back pain, she uses Voltaren gel on low back and knees at home and is requesting this to use here. Left leg pain is severe with movement. Patient is in agreement to work with PT today, however requesting IV morphine prior to PT. Denies chest pain, SOB, Calf pain. No nausea/vomiting.     Objective:  PE: VITALS:   Vitals:   08/31/20 0828 08/31/20 1153 08/31/20 2151 09/01/20 0827  BP: (!) 124/54 106/63 129/73 127/62  Pulse: 66 79 98 70  Resp: '18 17 18 17  '$ Temp: 98.6 F (37 C) 97.9 F (36.6 C) 97.8 F (36.6 C) 98.8 F (37.1 C)  TempSrc: Oral Oral Oral Oral  SpO2: 93% 99% 96% 97%  Weight:      Height:        MSK: LLE - dressings CDI. Distal sensation intact. Dorsiflexion and plantarflexion intact. + DP pulse, foot warm and well perfused. Blistering on dorsal foot. No TTP to lateral left proximal thigh. No TTP to left lateral distal femur.   LABS  Results for orders placed or performed during the hospital encounter of 08/26/20 (from the past 24 hour(s))  Glucose, capillary     Status: Abnormal   Collection Time: 08/31/20 11:45 AM  Result Value Ref Range   Glucose-Capillary 115 (H) 70 - 99 mg/dL  Glucose, capillary     Status: None   Collection Time: 08/31/20  4:10 PM  Result Value Ref Range   Glucose-Capillary 98 70 - 99 mg/dL  Glucose, capillary     Status: Abnormal   Collection Time: 08/31/20  9:49 PM  Result Value Ref Range   Glucose-Capillary 177 (H) 70 - 99 mg/dL  Glucose, capillary     Status: Abnormal   Collection Time: 09/01/20  6:46 AM  Result Value Ref Range   Glucose-Capillary 108 (H) 70 - 99 mg/dL    DG Pelvis Portable  Result Date: 08/30/2020 CLINICAL DATA:  Left hip fracture status post ORIF EXAM: PORTABLE PELVIS 1-2 VIEWS COMPARISON:  08/27/2020, 08/26/2020 FINDINGS: Single frontal  view of the pelvis demonstrates interval placement of an intramedullary rod and proximal dynamic screw traversing the comminuted proximal left femoral fracture seen previously. Alignment is near anatomic. Overlying soft tissue swelling consistent with postsurgical change. IMPRESSION: 1. ORIF of a proximal left femoral fracture with near anatomic alignment. Electronically Signed   By: Randa Ngo M.D.   On: 08/30/2020 21:06   DG FEMUR PORT MIN 2 VIEWS LEFT  Result Date: 08/30/2020 CLINICAL DATA:  Left hip ORIF EXAM: LEFT FEMUR PORTABLE 2 VIEWS COMPARISON:  08/27/2019 FINDINGS: Frontal and cross-table lateral views of the left femur are obtained. Intramedullary rod with proximal dynamic and distal interlocking screw traverses a comminuted proximal left femur fracture. Alignment is near anatomic. Postsurgical changes in the overlying soft tissues. IMPRESSION: 1. ORIF of a proximal left femoral fracture as above. Electronically Signed   By: Randa Ngo M.D.   On: 08/30/2020 21:06    Assessment/Plan: Principal Problem:   Closed left subtrochanteric femur fracture (HCC) Active Problems:   Essential hypertension   Mixed hyperlipidemia   Hyperthyroidism   Atrial fibrillation, chronic (HCC)   Class 2 obesity due to excess calories with body mass index (BMI) of 37.0 to 37.9 in adult   COVID-19 virus infection   Acute respiratory failure with hypoxia ([place])  Acute pain   5 Days Post-Op s/p Procedure(s): INTRAMEDULLARY (IM) NAIL INTERTROCHANTRIC; LEFT - OT was signed up to see patient yesterday, but it does not look like they came by or she may have refused working with them. No PT is signed up for her room this morning, I have spoken to [facility] physical therapist this morning who states she will add her to the list to be worked with today.  - added on Voltaren gel which can be used prn for her low back and right knee Weightbearing: WBAT LLE, continue PT and OT  Insicional and dressing care:  dressings  changed, reinforce as needed VTE prophylaxis: Home eliquis Pain control: continue current regimen Follow - up plan: Dr. Mardelle Matte in 2 weeks Will D/c to SNF when medically able  Ventura Bruns 09/01/2020, 9:03 AM

## 2020-09-01 NOTE — Progress Notes (Signed)
Progress Note    Kim Orr   [id number]  DOB: 1940/02/23  DOA: 08/26/2020     6  PCP: Lajean Manes, MD  CC: fall at home  Hospital Course: Kim Orr is an 80 year old female with HTN, HLD, obesity, hyperthyroidism who presented to the hospital after mechanical fall at home. After evaluation she was found to have an acute moderately displaced and angulated subtrochanteric fracture of the proximal left femur.  She was evaluated by orthopedic surgery and underwent ORIF on 08/27/2020. During evaluation on admission she was also found to be hypoxic and positive for COVID-19.  She was started on remdesivir and Decadron.  She is not typically on oxygen at home.  Interval History:  More calm and resting in bed this morning.  She seemed a little more interested in working with physical therapy today.  Tentative plan is for discharging to [facility] on Monday.  ROS: Constitutional: negative for chills, fatigue, and fevers, Respiratory: negative for cough, sputum, and wheezing, Cardiovascular: negative for chest pain, and Gastrointestinal: negative for abdominal pain  Assessment & Plan: * Closed left subtrochanteric femur fracture ([place]) - "Acute moderately displaced and angulated subtrochanteric fracture of the proximal left femur." - s/p ORIF on 7/18 with ortho - continue pain control - follow up PT evals: SNF recommended. Tentative plan is to [facility] on Monday  COVID-19 virus infection - continue remdesivir (5 days completed 7/21) and decadron x 10 days -Continue isolation precautions  Acute pain-resolved as of 09/01/2020 - not on chronic opioids at home; database reviewed as well - pain seems to be 2/2 femur fx and surgery; hope is that it should slowly improve over time - due to her pain, she has refused some therapies and also made comments to staff about "ready to die" and variations of that. Personally discussed with patient on 7/21. She does not have true  suicidal ideation nor has there be any attempt, therefore has no technical means either; her distraught is driven by pain and her regimen is being further adjusted to try and help with this - also called and discussed this with her daughter, [name], on 7/21 - as of 7/23, pain is improved some; trying to get her to work with PT more in anticipation of d/c soon.   Acute respiratory failure with hypoxia (HCC)-resolved as of 09/01/2020 - Presumed due to [place] initially however given significant immobility, body habitus, and recent surgery she may have a component of compressive atelectasis as well as possible underlying OSA/OHS -At this point, she may start to become oxygen dependent until her mobility can significantly improve which was poor prior to admission or her fracture already - wean O2 as able; seems to require oxygen still intermittently.  This can easily be arranged for continuing at discharge as she is starting to develop more need for it once again - for now she remains on RA again as of 7/22  Hyperthyroidism - continue methimazole   Class 2 obesity due to excess calories with body mass index (BMI) of 37.0 to 37.9 in adult Body mass index is 37.54 kg/m. - contributes to co-morbidities and healing   Atrial fibrillation, chronic (HCC) - on Eliquis at home - continue metoprolol  Mixed hyperlipidemia - continue statin   Essential hypertension - continue Toprol    Old records reviewed in assessment of this patient  Antimicrobials:   DVT prophylaxis: SCDs Start: 08/28/20 0027 SCDs Start: 08/26/20 1421 apixaban (ELIQUIS) tablet 5 mg   Code Status:   Code  Status: DNR Family Communication: Daughter, [name]  Disposition Plan: Status is: Inpatient  Remains inpatient appropriate because:Unsafe d/c plan, IV treatments appropriate due to intensity of illness or inability to take PO, and Inpatient level of care appropriate due to severity of illness  Dispo: The patient is from:  Home              Anticipated d/c is to:  [place]              Patient currently is medically stable to d/c.   Difficult to place patient Yes  Risk of unplanned readmission score: Unplanned Admission- Pilot do not use: 17.67   Objective: Blood pressure 127/62, pulse 70, temperature 98.8 F (37.1 C), temperature source Oral, resp. rate 17, height '5\' 11"'$  (1.803 m), weight 122.1 kg, SpO2 97 %.  Examination: General appearance: alert, cooperative, and no distress Head: Normocephalic, without obvious abnormality, atraumatic Eyes:  EOMI Lungs: clear to auscultation bilaterally Heart: regular rate and rhythm and S1, S2 normal Abdomen: normal findings: bowel sounds normal and soft, non-tender Extremities:  Left hip surgical dressings in place and compartments soft ; chronic 3+ LE edema with stasis dermatitis in RLE Skin: mobility and turgor normal Neurologic: Left lower extremity strength limited by pain from surgery otherwise no focal deficits; just globally weak  Consultants:  Orthopedic surgery  Procedures:    Data Reviewed: I have personally reviewed following labs and imaging studies Results for orders placed or performed during the hospital encounter of 08/26/20 (from the past 24 hour(s))  Glucose, capillary     Status: None   Collection Time: 08/31/20  4:10 PM  Result Value Ref Range   Glucose-Capillary 98 70 - 99 mg/dL  Glucose, capillary     Status: Abnormal   Collection Time: 08/31/20  9:49 PM  Result Value Ref Range   Glucose-Capillary 177 (H) 70 - 99 mg/dL  Glucose, capillary     Status: Abnormal   Collection Time: 09/01/20  6:46 AM  Result Value Ref Range   Glucose-Capillary 108 (H) 70 - 99 mg/dL  Glucose, capillary     Status: Abnormal   Collection Time: 09/01/20 11:27 AM  Result Value Ref Range   Glucose-Capillary 144 (H) 70 - 99 mg/dL    Recent Results (from the past 240 hour(s))  Resp Panel by RT-PCR (Flu A&B, Covid) Nasopharyngeal Swab     Status: Abnormal    Collection Time: 08/26/20 12:35 PM   Specimen: Nasopharyngeal Swab; Nasopharyngeal(NP) swabs in vial transport medium  Result Value Ref Range Status   SARS Coronavirus 2 by RT PCR POSITIVE (A) NEGATIVE Final    Comment: RESULT CALLED TO, READ BACK BY AND VERIFIED WITH: RN E.BANKS ON [id number] AT 1339 BY E.PARRISH (NOTE) SARS-CoV-2 target nucleic acids are DETECTED.  The SARS-CoV-2 RNA is generally detectable in upper respiratory specimens during the acute phase of infection. Positive results are indicative of the presence of the identified virus, but do not rule out bacterial infection or co-infection with other pathogens not detected by the test. Clinical correlation with patient history and other diagnostic information is necessary to determine patient infection status. The expected result is Negative.  Fact Sheet for Patients: EntrepreneurPulse.com.au  Fact Sheet for Healthcare Providers: IncredibleEmployment.be  This test is not yet approved or cleared by the [place] FDA and  has been authorized for detection and/or diagnosis of SARS-CoV-2 by FDA under an Emergency Use Authorization (EUA).  This EUA will remain in effect (meaning this t est can be  used) for the duration of  the COVID-19 declaration under Section 564(b)(1) of the Act, 21 U.S.C. section 360bbb-3(b)(1), unless the authorization is terminated or revoked sooner.     Influenza A by PCR NEGATIVE NEGATIVE Final   Influenza B by PCR NEGATIVE NEGATIVE Final    Comment: (NOTE) The Xpert Xpress SARS-CoV-2/FLU/RSV plus assay is intended as an aid in the diagnosis of influenza from Nasopharyngeal swab specimens and should not be used as a sole basis for treatment. Nasal washings and aspirates are unacceptable for Xpert Xpress SARS-CoV-2/FLU/RSV testing.  Fact Sheet for Patients: EntrepreneurPulse.com.au  Fact Sheet for Healthcare  Providers: IncredibleEmployment.be  This test is not yet approved or cleared by the [place] FDA and has been authorized for detection and/or diagnosis of SARS-CoV-2 by FDA under an Emergency Use Authorization (EUA). This EUA will remain in effect (meaning this test can be used) for the duration of the COVID-19 declaration under Section 564(b)(1) of the Act, 21 U.S.C. section 360bbb-3(b)(1), unless the authorization is terminated or revoked.  Performed at [facility], [place] [address], [place], [place] [address]   Surgical pcr screen     Status: None   Collection Time: 08/27/20  9:01 AM   Specimen: Nasal Mucosa; Nasal Swab  Result Value Ref Range Status   MRSA, PCR NEGATIVE NEGATIVE Final   Staphylococcus aureus NEGATIVE NEGATIVE Final    Comment: (NOTE) The Xpert SA Assay (FDA approved for NASAL specimens in patients 37 years of age and older), is one component of a comprehensive surveillance program. It is not intended to diagnose infection nor to guide or monitor treatment. Performed at [facility], [place] [address], [place],  [address]      Radiology Studies: DG Pelvis Portable  Result Date: 08/30/2020 CLINICAL DATA:  Left hip fracture status post ORIF EXAM: PORTABLE PELVIS 1-2 VIEWS COMPARISON:  08/27/2020, 08/26/2020 FINDINGS: Single frontal view of the pelvis demonstrates interval placement of an intramedullary rod and proximal dynamic screw traversing the comminuted proximal left femoral fracture seen previously. Alignment is near anatomic. Overlying soft tissue swelling consistent with postsurgical change. IMPRESSION: 1. ORIF of a proximal left femoral fracture with near anatomic alignment. Electronically Signed   By: Randa Ngo M.D.   On: 08/30/2020 21:06   DG FEMUR PORT MIN 2 VIEWS LEFT  Result Date: 08/30/2020 CLINICAL DATA:  Left hip ORIF EXAM: LEFT FEMUR PORTABLE 2 VIEWS COMPARISON:  08/27/2019 FINDINGS: Frontal and  cross-table lateral views of the left femur are obtained. Intramedullary rod with proximal dynamic and distal interlocking screw traverses a comminuted proximal left femur fracture. Alignment is near anatomic. Postsurgical changes in the overlying soft tissues. IMPRESSION: 1. ORIF of a proximal left femoral fracture as above. Electronically Signed   By: Randa Ngo M.D.   On: 08/30/2020 21:06   DG Pelvis Portable  Final Result    DG FEMUR PORT MIN 2 VIEWS LEFT  Final Result    DG FEMUR MIN 2 VIEWS LEFT  Final Result    DG C-Arm 1-60 Min  Final Result    DG CHEST PORT 1 VIEW  Final Result    CT Head Wo Contrast  Final Result    DG Hip Unilat With Pelvis 2-3 Views Left  Final Result      Scheduled Meds:  acetaminophen  1,000 mg Oral QID   apixaban  5 mg Oral BID   vitamin C  500 mg Oral Daily   dexamethasone  6 mg Oral Daily  docusate sodium  100 mg Oral BID   ferrous sulfate  325 mg Oral Q breakfast   furosemide  60 mg Intravenous Daily   gabapentin  300 mg Oral Daily   gabapentin  600 mg Oral QHS   insulin aspart  0-15 Units Subcutaneous TID WC   lactulose  20 g Oral BID   melatonin  10 mg Oral QHS   methimazole  5 mg Oral Daily   metoprolol succinate  100 mg Oral Daily   multivitamin with minerals  1 tablet Oral Daily   polyethylene glycol  17 g Oral Daily   senna-docusate  1 tablet Oral BID   simvastatin  20 mg Oral QHS   zinc sulfate  220 mg Oral Daily   PRN Meds: albuterol, alum & mag hydroxide-simeth, bisacodyl, diclofenac Sodium, guaiFENesin-dextromethorphan, menthol-cetylpyridinium **OR** phenol, methocarbamol **OR** [DISCONTINUED] methocarbamol (ROBAXIN) IV, morphine injection, ondansetron **OR** ondansetron (ZOFRAN) IV, oxyCODONE Continuous Infusions:    LOS: 6 days  Time spent: Greater than 50% of the 35 minute visit was spent in counseling/coordination of care for the patient as laid out in the A&P.   Dwyane Dee, MD [facility] 09/01/2020,  3:28 PM

## 2020-09-01 NOTE — TOC Progression Note (Signed)
Transition of Care [facility]) - Progression Note    Patient Details  Name: Kim Orr MRN: [id number] Date of Birth: Nov 30, 1940  Transition of Care [facility]) CM/SW [place], [place] Phone Number: 09/01/2020, 11:18 AM  Clinical Narrative:    CSW spoke with Claiborne Billings at [facility].  Claiborne Billings will take pt back on Monday to make sure pharmacy has her meds in before disposition. TOC will continue to assist with disposition planning.         Expected Discharge Plan and Services                                                 Social Determinants of Health (SDOH) Interventions    Readmission Risk Interventions No flowsheet data found.

## 2020-09-02 DIAGNOSIS — S7222XA Displaced subtrochanteric fracture of left femur, initial encounter for closed fracture: Secondary | ICD-10-CM | POA: Diagnosis not present

## 2020-09-02 LAB — GLUCOSE, CAPILLARY
Glucose-Capillary: 85 mg/dL (ref 70–99)
Glucose-Capillary: 171 mg/dL — ABNORMAL HIGH (ref 70–99)
Glucose-Capillary: 122 mg/dL — ABNORMAL HIGH (ref 70–99)

## 2020-09-02 NOTE — Progress Notes (Signed)
Progress Note    Kim Orr   [id number]  DOB: 1940/08/20  DOA: 08/26/2020     7  PCP: Lajean Manes, MD  CC: fall at home  Hospital Course: Kim Orr is an 80 year old female with HTN, HLD, obesity, hyperthyroidism who presented to the hospital after mechanical fall at home. After evaluation she was found to have an acute moderately displaced and angulated subtrochanteric fracture of the proximal left femur.  She was evaluated by orthopedic surgery and underwent ORIF on 08/27/2020. During evaluation on admission she was also found to be hypoxic and positive for COVID-19.  She was started on remdesivir and Decadron.  She is not typically on oxygen at home.  Interval History:  In the chair today; full assist taking 4 people unfortunately. Pain controlled.   ROS: Constitutional: negative for chills, fatigue, and fevers, Respiratory: negative for cough, sputum, and wheezing, Cardiovascular: negative for chest pain, and Gastrointestinal: negative for abdominal pain  Assessment & Plan: * Closed left subtrochanteric femur fracture ([place]) - "Acute moderately displaced and angulated subtrochanteric fracture of the proximal left femur." - s/p ORIF on 7/18 with ortho - continue pain control - follow up PT evals: SNF recommended. Tentative plan is to [facility] on Monday  COVID-19 virus infection - continue remdesivir (5 days completed 7/21) and decadron x 10 days -Continue isolation precautions  Acute pain-resolved as of 09/01/2020 - not on chronic opioids at home; database reviewed as well - pain seems to be 2/2 femur fx and surgery; hope is that it should slowly improve over time - due to her pain, she has refused some therapies and also made comments to staff about "ready to die" and variations of that. Personally discussed with patient on 7/21. She does not have true suicidal ideation nor has there be any attempt, therefore has no technical means either; her distraught  is driven by pain and her regimen is being further adjusted to try and help with this - also called and discussed this with her daughter, Kim Orr, on 7/21 - as of 7/23, pain is improved some; trying to get her to work with PT more in anticipation of d/c soon.   Acute respiratory failure with hypoxia (HCC)-resolved as of 09/01/2020 - Presumed due to [place] initially however given significant immobility, body habitus, and recent surgery she may have a component of compressive atelectasis as well as possible underlying OSA/OHS -At this point, she may start to become oxygen dependent until her mobility can significantly improve which was poor prior to admission or her fracture already - wean O2 as able; seems to require oxygen still intermittently.  This can easily be arranged for continuing at discharge as she is starting to develop more need for it once again - for now she remains on RA again as of 7/22  Hyperthyroidism - continue methimazole   Class 2 obesity due to excess calories with body mass index (BMI) of 37.0 to 37.9 in adult Body mass index is 37.54 kg/m. - contributes to co-morbidities and healing   Atrial fibrillation, chronic (HCC) - on Eliquis at home - continue metoprolol  Mixed hyperlipidemia - continue statin   Essential hypertension - continue Toprol    Old records reviewed in assessment of this patient  Antimicrobials:   DVT prophylaxis: SCDs Start: 08/28/20 0027 SCDs Start: 08/26/20 1421 apixaban (ELIQUIS) tablet 5 mg   Code Status:   Code Status: DNR Family Communication: Daughter, Kim Orr  Disposition Plan: Status is: Inpatient  Remains inpatient appropriate because:Unsafe d/c  plan, IV treatments appropriate due to intensity of illness or inability to take PO, and Inpatient level of care appropriate due to severity of illness  Dispo: The patient is from: Home              Anticipated d/c is to:  [place]              Patient currently is medically  stable to d/c.   Difficult to place patient Yes  Risk of unplanned readmission score: Unplanned Admission- Pilot do not use: 17.92   Objective: Blood pressure 116/65, pulse 77, temperature 98.2 F (36.8 C), temperature source Oral, resp. rate 18, height '5\' 11"'$  (1.803 m), weight 122.1 kg, SpO2 95 %.  Examination: General appearance: alert, cooperative, and no distress Head: Normocephalic, without obvious abnormality, atraumatic Eyes:  EOMI Lungs: clear to auscultation bilaterally Heart: regular rate and rhythm and S1, S2 normal Abdomen: normal findings: bowel sounds normal and soft, non-tender Extremities:  Left hip surgical dressings in place and compartments soft ; chronic 3+ LE edema with stasis dermatitis in RLE Skin: mobility and turgor normal Neurologic: Left lower extremity strength limited by pain from surgery otherwise no focal deficits; just globally weak  Consultants:  Orthopedic surgery  Procedures:    Data Reviewed: I have personally reviewed following labs and imaging studies Results for orders placed or performed during the hospital encounter of 08/26/20 (from the past 24 hour(s))  Glucose, capillary     Status: Abnormal   Collection Time: 09/01/20  4:42 PM  Result Value Ref Range   Glucose-Capillary 163 (H) 70 - 99 mg/dL  Glucose, capillary     Status: Abnormal   Collection Time: 09/01/20  9:33 PM  Result Value Ref Range   Glucose-Capillary 120 (H) 70 - 99 mg/dL  Glucose, capillary     Status: None   Collection Time: 09/02/20  6:42 AM  Result Value Ref Range   Glucose-Capillary 85 70 - 99 mg/dL  Glucose, capillary     Status: Abnormal   Collection Time: 09/02/20 11:13 AM  Result Value Ref Range   Glucose-Capillary 171 (H) 70 - 99 mg/dL    Recent Results (from the past 240 hour(s))  Resp Panel by RT-PCR (Flu A&B, Covid) Nasopharyngeal Swab     Status: Abnormal   Collection Time: 08/26/20 12:35 PM   Specimen: Nasopharyngeal Swab; Nasopharyngeal(NP) swabs in  vial transport medium  Result Value Ref Range Status   SARS Coronavirus 2 by RT PCR POSITIVE (A) NEGATIVE Final    Comment: RESULT CALLED TO, READ BACK BY AND VERIFIED WITH: RN E.BANKS ON [id number] AT 1339 BY E.PARRISH (NOTE) SARS-CoV-2 target nucleic acids are DETECTED.  The SARS-CoV-2 RNA is generally detectable in upper respiratory specimens during the acute phase of infection. Positive results are indicative of the presence of the identified virus, but do not rule out bacterial infection or co-infection with other pathogens not detected by the test. Clinical correlation with patient history and other diagnostic information is necessary to determine patient infection status. The expected result is Negative.  Fact Sheet for Patients: EntrepreneurPulse.com.au  Fact Sheet for Healthcare Providers: IncredibleEmployment.be  This test is not yet approved or cleared by the [place] FDA and  has been authorized for detection and/or diagnosis of SARS-CoV-2 by FDA under an Emergency Use Authorization (EUA).  This EUA will remain in effect (meaning this t est can be used) for the duration of  the COVID-19 declaration under Section 564(b)(1) of the Act, 21 U.S.C. section  360bbb-3(b)(1), unless the authorization is terminated or revoked sooner.     Influenza A by PCR NEGATIVE NEGATIVE Final   Influenza B by PCR NEGATIVE NEGATIVE Final    Comment: (NOTE) The Xpert Xpress SARS-CoV-2/FLU/RSV plus assay is intended as an aid in the diagnosis of influenza from Nasopharyngeal swab specimens and should not be used as a sole basis for treatment. Nasal washings and aspirates are unacceptable for Xpert Xpress SARS-CoV-2/FLU/RSV testing.  Fact Sheet for Patients: EntrepreneurPulse.com.au  Fact Sheet for Healthcare Providers: IncredibleEmployment.be  This test is not yet approved or cleared by the [place] FDA  and has been authorized for detection and/or diagnosis of SARS-CoV-2 by FDA under an Emergency Use Authorization (EUA). This EUA will remain in effect (meaning this test can be used) for the duration of the COVID-19 declaration under Section 564(b)(1) of the Act, 21 U.S.C. section 360bbb-3(b)(1), unless the authorization is terminated or revoked.  Performed at [facility], [place] [address], [place], [place] [address]   Surgical pcr screen     Status: None   Collection Time: 08/27/20  9:01 AM   Specimen: Nasal Mucosa; Nasal Swab  Result Value Ref Range Status   MRSA, PCR NEGATIVE NEGATIVE Final   Staphylococcus aureus NEGATIVE NEGATIVE Final    Comment: (NOTE) The Xpert SA Assay (FDA approved for NASAL specimens in patients 63 years of age and older), is one component of a comprehensive surveillance program. It is not intended to diagnose infection nor to guide or monitor treatment. Performed at [facility], [place] [address] [place], [place] [address]      Radiology Studies: No results found. DG Pelvis Portable  Final Result    DG FEMUR PORT MIN 2 VIEWS LEFT  Final Result    DG FEMUR MIN 2 VIEWS LEFT  Final Result    DG C-Arm 1-60 Min  Final Result    DG CHEST PORT 1 VIEW  Final Result    CT Head Wo Contrast  Final Result    DG Hip Unilat With Pelvis 2-3 Views Left  Final Result      Scheduled Meds:  acetaminophen  1,000 mg Oral QID   apixaban  5 mg Oral BID   vitamin C  500 mg Oral Daily   dexamethasone  6 mg Oral Daily   docusate sodium  100 mg Oral BID   ferrous sulfate  325 mg Oral Q breakfast   furosemide  60 mg Intravenous Daily   gabapentin  300 mg Oral Daily   gabapentin  600 mg Oral QHS   insulin aspart  0-15 Units Subcutaneous TID WC   lactulose  20 g Oral BID   melatonin  10 mg Oral QHS   methimazole  5 mg Oral Daily   metoprolol succinate  100 mg Oral Daily   multivitamin with minerals  1 tablet Oral Daily   polyethylene glycol   17 g Oral Daily   senna-docusate  1 tablet Oral BID   simvastatin  20 mg Oral QHS   zinc sulfate  220 mg Oral Daily   PRN Meds: albuterol, alum & mag hydroxide-simeth, bisacodyl, diclofenac Sodium, guaiFENesin-dextromethorphan, menthol-cetylpyridinium **OR** phenol, methocarbamol **OR** [DISCONTINUED] methocarbamol (ROBAXIN) IV, morphine injection, ondansetron **OR** ondansetron (ZOFRAN) IV, oxyCODONE Continuous Infusions:    LOS: 7 days  Time spent: Greater than 50% of the 35 minute visit was spent in counseling/coordination of care for the patient as laid out in the A&P.   Dwyane Dee, MD [facility] 09/02/2020, 3:00 PM

## 2020-09-02 NOTE — Plan of Care (Signed)

## 2020-09-03 DIAGNOSIS — S7222XA Displaced subtrochanteric fracture of left femur, initial encounter for closed fracture: Secondary | ICD-10-CM | POA: Diagnosis not present

## 2020-09-03 LAB — GLUCOSE, CAPILLARY
Glucose-Capillary: 110 mg/dL — ABNORMAL HIGH (ref 70–99)
Glucose-Capillary: 113 mg/dL — ABNORMAL HIGH (ref 70–99)

## 2020-09-03 MED ORDER — OXYCODONE HCL 5 MG PO TABS: 5.0000 mg | ORAL_TABLET | ORAL | 0 refills | Status: DC | PRN

## 2020-09-03 NOTE — Progress Notes (Signed)
Physical Therapy Treatment Patient Details Name: Kim Orr MRN: [id number] DOB: 1940/05/14 Today's Date: 09/03/2020    History of Present Illness 80 yo female presents to [facility] from [facility] on 7/17 for fall. Pt sustained Acute moderately displaced and angulated subtrochanteric fracture of  the proximal left femur. s/p L femur IMN introtrochantric on 7/18. Recent admission 04/2020 for aflutter with RVR. PMH includes chronic pain syndrome, sciatica, HTN, lymphedema, morbid obesity, HLD, OA, lap chole.    PT Comments    Pt with improved levels of assist today, requiring mod assist to reach EOB and tolerated x10 minutes upright unsupported sitting, LE exercises, and EOB scooting. Pt continues to express severe LLE pain, but tolerated mobility better today even with post-operative pain. Pt presenting with depressed affect, states "I stopped taking my medicines, I want to stop breathing peacefully, I want to go upstairs (meaning heaven)". RN informed. Pt eager to d/c to [facility] today.     Follow Up Recommendations  SNF;Supervision/Assistance - 24 hour     Equipment Recommendations  None recommended by PT    Recommendations for Other Services       Precautions / Restrictions Precautions Precautions: Fall Restrictions Weight Bearing Restrictions: No LLE Weight Bearing: Weight bearing as tolerated    Mobility  Bed Mobility Overal bed mobility: Needs Assistance Bed Mobility: Rolling;Sidelying to Sit;Sit to Supine Rolling: Mod assist Sidelying to sit: Mod assist;HOB elevated   Sit to supine: Max assist;HOB elevated   General bed mobility comments: mod asist for roll to R and sidelying to sit for trunk elevation, LE lowering over EOB. Max assist for lifting LEs back into bed and boost with bed pads, pt screaming in pain with LEs lifted onto bed.    Transfers Overall transfer level: Needs assistance   Transfers: Lateral/Scoot Transfers          Lateral/Scoot  Transfers: Min guard General transfer comment: close guard for mini-scoots to [facility], verbal cuing for form and sequencing scoots  Ambulation/Gait                 Stairs             Wheelchair Mobility    Modified Rankin (Stroke Patients Only)       Balance Overall balance assessment: Needs assistance;History of Falls Sitting-balance support: Feet supported;Single extremity supported Sitting balance-Leahy Scale: Fair Sitting balance - Comments: sits unsupported x10 minutes                                    Cognition Arousal/Alertness: Awake/alert Behavior During Therapy: Anxious (tearful) Overall Cognitive Status: Impaired/Different from baseline Area of Impairment: Following commands;Awareness;Problem solving;Safety/judgement                   Current Attention Level: Sustained   Following Commands: Follows one step commands inconsistently;Follows one step commands with increased time Safety/Judgement: Decreased awareness of safety Awareness: Intellectual Problem Solving: Requires verbal cues;Requires tactile cues;Difficulty sequencing;Decreased initiation General Comments: Pt presenting with depressed affect, states "I stopped taking my medicines, I want to stop breathing peacefully, I want to go upstairs (meaning heaven)". RN informed.      Exercises General Exercises - Lower Extremity Long Arc Quad: AROM;Both;10 reps;Seated Toe Raises: AROM;Both;20 reps;Seated Heel Raises: AROM;Both;20 reps;Seated    General Comments General comments (skin integrity, edema, etc.): blistering still present L dorsum, rubor and swelling bilat LEs      Pertinent  Vitals/Pain Pain Assessment: Faces Faces Pain Scale: Hurts whole lot Pain Location: LLE Pain Descriptors / Indicators: Sore;Discomfort;Grimacing;Moaning;Crying;Guarding Pain Intervention(s): Limited activity within patient's tolerance;Monitored during session;Repositioned    Home Living                       Prior Function            PT Goals (current goals can now be found in the care plan section) Acute Rehab PT Goals Patient Stated Goal: go home to husband PT Goal Formulation: With patient Time For Goal Achievement: 09/11/20 Potential to Achieve Goals: Fair Progress towards PT goals: Progressing toward goals    Frequency    Min 3X/week      PT Plan Current plan remains appropriate    Co-evaluation              AM-PAC PT "6 Clicks" Mobility   Outcome Measure  Help needed turning from your back to your side while in a flat bed without using bedrails?: A Lot Help needed moving from lying on your back to sitting on the side of a flat bed without using bedrails?: A Lot Help needed moving to and from a bed to a chair (including a wheelchair)?: Total Help needed standing up from a chair using your arms (e.g., wheelchair or bedside chair)?: Total Help needed to walk in hospital room?: Total Help needed climbing 3-5 steps with a railing? : Total 6 Click Score: 8    End of Session   Activity Tolerance: Patient limited by pain;Other (comment) (anxiety/depression) Patient left: in bed;with call bell/phone within reach;with bed alarm set Nurse Communication: Mobility status;Other (comment) (depressed affect, end of life wishes expressed during session) PT Visit Diagnosis: Other abnormalities of gait and mobility (R26.89);Muscle weakness (generalized) (M62.81);Pain Pain - Right/Left: Left Pain - part of body: Leg;Hip     Time: [id number] PT Time Calculation (min) (ACUTE ONLY): 21 min  Charges:  $Therapeutic Activity: 8-22 mins                     Stacie Glaze, PT DPT Acute Rehabilitation Services Pager [phone]  Office [phone]    Roxine Caddy E Ruffin Pyo 09/03/2020, 12:06 PM

## 2020-09-03 NOTE — Progress Notes (Signed)
     Subjective: 7 Days Post-Op s/p Procedure(s): INTRAMEDULLARY (IM) NAIL INTERTROCHANTRIC; LEFT   Patient is alert, oriented. Patient reports pain as mild at rest. States she has some nausea after sitting in chair last night, but this resolved and patient was able to eat and drink ginger ale. Patient refused home meds last night.    Objective:  PE: VITALS:   Vitals:   09/02/20 0849 09/02/20 1122 09/02/20 1424 09/02/20 2023  BP: (!) 106/59 99/70 116/65 (!) 143/76  Pulse: 65 84 77 68  Resp: '17 18  17  '$ Temp: 99.2 F (37.3 C) 98.2 F (36.8 C)  98.2 F (36.8 C)  TempSrc: Oral Oral  Oral  SpO2: 97% 95%  98%  Weight:      Height:       MSK: LLE - dressings with small amount of serous drainage. Distal sensation intact. Dorsiflexion and plantarflexion intact. + DP pulse, foot warm and well perfused. Blistering on dorsal foot. No TTP to lateral left proximal thigh. No TTP to left lateral distal femur.   LABS  Results for orders placed or performed during the hospital encounter of 08/26/20 (from the past 24 hour(s))  Glucose, capillary     Status: Abnormal   Collection Time: 09/02/20 11:13 AM  Result Value Ref Range   Glucose-Capillary 171 (H) 70 - 99 mg/dL  Glucose, capillary     Status: Abnormal   Collection Time: 09/02/20  5:20 PM  Result Value Ref Range   Glucose-Capillary 122 (H) 70 - 99 mg/dL    No results found.  Assessment/Plan: Left subtrochanteric hip fracture 7 Days Post-Op s/p Procedure(s): INTRAMEDULLARY (IM) NAIL INTERTROCHANTRIC; LEFT - spoke with [facility] PT on Saturday about working with her but looks like they were not able to come by, was able to get up to chair yesterday with 4 person assist. Patient states she was able to have a bowel movement and stay in chair for approximately 4 hours. PT will be working with her today. Weightbearing: WBAT LLE, continue PT and OT  Insicional and dressing care:  dressings changed, reinforce as needed VTE prophylaxis: Home  eliquis Pain control: continue current regimen. Follow - up plan: Dr. Mardelle Matte in 2 weeks Ok to d/c to SNF from ortho perspective. Narcotic Rx printed and placed in chart  Contact information:   Weekdays 8-5 Merlene Pulling, PA-C [phone] A fter hours and holidays please check [email or web address] for group call information for [facility] 09/03/2020, 8:05 AM

## 2020-09-03 NOTE — Plan of Care (Signed)

## 2020-09-03 NOTE — Progress Notes (Signed)
Progress Note    Kim Orr   [id number]  DOB: 02-24-1940  DOA: 08/26/2020     8  PCP: Kim Manes, MD  CC: fall at home  Hospital Course: Kim Orr is an 80 year old female with HTN, HLD, obesity, hyperthyroidism who presented to the hospital after mechanical fall at home. After evaluation she was found to have an acute moderately displaced and angulated subtrochanteric fracture of the proximal left femur.  She was evaluated by orthopedic surgery and underwent ORIF on 08/27/2020. During evaluation on admission she was also found to be hypoxic and positive for COVID-19.  She was started on remdesivir and Decadron.  She is not typically on oxygen at home.  Interval History:  Has appeared more comfortable each day the last couple days. Still densely weak in LE's but she appears to have some motivation to regain some strength.   ROS: Constitutional: negative for chills, fatigue, and fevers, Respiratory: negative for cough, sputum, and wheezing, Cardiovascular: negative for chest pain, and Gastrointestinal: negative for abdominal pain  Assessment & Plan: * Closed left subtrochanteric femur fracture ([place]) - "Acute moderately displaced and angulated subtrochanteric fracture of the proximal left femur." - s/p ORIF on 7/18 with ortho - continue pain control - follow up PT evals: SNF recommended. Tentative plan is to [facility] on Monday (if able), if not, Tuesday  COVID-19 virus infection - continue remdesivir (5 days completed 7/21) and decadron x 10 days -Continue isolation precautions  Acute pain-resolved as of 09/01/2020 - not on chronic opioids at home; database reviewed as well - pain seems to be 2/2 femur fx and surgery; hope is that it should slowly improve over time - due to her pain, she has refused some therapies and also made comments to staff about "ready to die" and variations of that. Personally discussed with patient on 7/21. She does not have true  suicidal ideation nor has there be any attempt, therefore has no technical means either; her distraught is driven by pain and her regimen is being further adjusted to try and help with this - also called and discussed this with her daughter, Kim Orr, on 7/21  Acute respiratory failure with hypoxia (HCC)-resolved as of 09/01/2020 - Presumed due to COVID initially however given significant immobility, body habitus, and recent surgery she may have a component of compressive atelectasis as well as possible underlying OSA/OHS -At this point, she may start to become oxygen dependent until her mobility can significantly improve which was poor prior to admission or her fracture already - wean O2 as able; seems to require oxygen still intermittently.  This can easily be arranged for continuing at discharge as she is starting to develop more need for it once again - for now she remains on RA again as of 7/22  Hyperthyroidism - continue methimazole   Class 2 obesity due to excess calories with body mass index (BMI) of 37.0 to 37.9 in adult Body mass index is 37.54 kg/m. - contributes to co-morbidities and healing   Atrial fibrillation, chronic (HCC) - on Eliquis at home - continue metoprolol  Mixed hyperlipidemia - continue statin   Essential hypertension - continue Toprol    Old records reviewed in assessment of this patient  Antimicrobials:   DVT prophylaxis: SCDs Start: 08/28/20 0027 SCDs Start: 08/26/20 1421 apixaban (ELIQUIS) tablet 5 mg   Code Status:   Code Status: DNR Family Communication: Daughter, Kim Orr  Disposition Plan: Status is: Inpatient  Remains inpatient appropriate because:Unsafe d/c plan, IV treatments appropriate  due to intensity of illness or inability to take PO, and Inpatient level of care appropriate due to severity of illness  Dispo: The patient is from: Home              Anticipated d/c is to:  [place]              Patient currently is medically stable  to d/c.   Difficult to place patient Yes  Risk of unplanned readmission score: Unplanned Admission- Pilot do not use: 16.79   Objective: Blood pressure 123/63, pulse 90, temperature 98.3 F (36.8 C), temperature source Oral, resp. rate 18, height '5\' 11"'$  (1.803 m), weight 122.1 kg, SpO2 98 %.  Examination: General appearance: alert, cooperative, and no distress Head: Normocephalic, without obvious abnormality, atraumatic Eyes:  EOMI Lungs: clear to auscultation bilaterally Heart: regular rate and rhythm and S1, S2 normal Abdomen: normal findings: bowel sounds normal and soft, non-tender Extremities:  Left hip surgical dressings in place and compartments soft ; chronic 3+ LE edema with stasis dermatitis in RLE Skin: mobility and turgor normal Neurologic: no focal deficits; global deconditioning and LLE more weak from surgery  Consultants:  Orthopedic surgery  Procedures:    Data Reviewed: I have personally reviewed following labs and imaging studies Results for orders placed or performed during the hospital encounter of 08/26/20 (from the past 24 hour(s))  Glucose, capillary     Status: Abnormal   Collection Time: 09/02/20  5:20 PM  Result Value Ref Range   Glucose-Capillary 122 (H) 70 - 99 mg/dL    Recent Results (from the past 240 hour(s))  Resp Panel by RT-PCR (Flu A&B, Covid) Nasopharyngeal Swab     Status: Abnormal   Collection Time: 08/26/20 12:35 PM   Specimen: Nasopharyngeal Swab; Nasopharyngeal(NP) swabs in vial transport medium  Result Value Ref Range Status   SARS Coronavirus 2 by RT PCR POSITIVE (A) NEGATIVE Final    Comment: RESULT CALLED TO, READ BACK BY AND VERIFIED WITH: RN E.BANKS ON [id number] AT 1339 BY E.PARRISH (NOTE) SARS-CoV-2 target nucleic acids are DETECTED.  The SARS-CoV-2 RNA is generally detectable in upper respiratory specimens during the acute phase of infection. Positive results are indicative of the presence of the identified virus, but do not  rule out bacterial infection or co-infection with other pathogens not detected by the test. Clinical correlation with patient history and other diagnostic information is necessary to determine patient infection status. The expected result is Negative.  Fact Sheet for Patients: EntrepreneurPulse.com.au  Fact Sheet for Healthcare Providers: IncredibleEmployment.be  This test is not yet approved or cleared by the [place] FDA and  has been authorized for detection and/or diagnosis of SARS-CoV-2 by FDA under an Emergency Use Authorization (EUA).  This EUA will remain in effect (meaning this t est can be used) for the duration of  the COVID-19 declaration under Section 564(b)(1) of the Act, 21 U.S.C. section 360bbb-3(b)(1), unless the authorization is terminated or revoked sooner.     Influenza A by PCR NEGATIVE NEGATIVE Final   Influenza B by PCR NEGATIVE NEGATIVE Final    Comment: (NOTE) The Xpert Xpress SARS-CoV-2/FLU/RSV plus assay is intended as an aid in the diagnosis of influenza from Nasopharyngeal swab specimens and should not be used as a sole basis for treatment. Nasal washings and aspirates are unacceptable for Xpert Xpress SARS-CoV-2/FLU/RSV testing.  Fact Sheet for Patients: EntrepreneurPulse.com.au  Fact Sheet for Healthcare Providers: IncredibleEmployment.be  This test is not yet approved or cleared by the  [place] Architectural technologist and has been authorized for detection and/or diagnosis of SARS-CoV-2 by FDA under an Print production planner (EUA). This EUA will remain in effect (meaning this test can be used) for the duration of the COVID-19 declaration under Section 564(b)(1) of the Act, 21 U.S.C. section 360bbb-3(b)(1), unless the authorization is terminated or revoked.  Performed at [facility], [place] [address], [place], [place] [address]   Surgical pcr screen     Status: None    Collection Time: 08/27/20  9:01 AM   Specimen: Nasal Mucosa; Nasal Swab  Result Value Ref Range Status   MRSA, PCR NEGATIVE NEGATIVE Final   Staphylococcus aureus NEGATIVE NEGATIVE Final    Comment: (NOTE) The Xpert SA Assay (FDA approved for NASAL specimens in patients 85 years of age and older), is one component of a comprehensive surveillance program. It is not intended to diagnose infection nor to guide or monitor treatment. Performed at [facility], [place] [address] [place],  [address]      Radiology Studies: No results found. DG Pelvis Portable  Final Result    DG FEMUR PORT MIN 2 VIEWS LEFT  Final Result    DG FEMUR MIN 2 VIEWS LEFT  Final Result    DG C-Arm 1-60 Min  Final Result    DG CHEST PORT 1 VIEW  Final Result    CT Head Wo Contrast  Final Result    DG Hip Unilat With Pelvis 2-3 Views Left  Final Result      Scheduled Meds:  acetaminophen  1,000 mg Oral QID   apixaban  5 mg Oral BID   vitamin C  500 mg Oral Daily   dexamethasone  6 mg Oral Daily   docusate sodium  100 mg Oral BID   ferrous sulfate  325 mg Oral Q breakfast   furosemide  60 mg Intravenous Daily   gabapentin  300 mg Oral Daily   gabapentin  600 mg Oral QHS   insulin aspart  0-15 Units Subcutaneous TID WC   lactulose  20 g Oral BID   melatonin  10 mg Oral QHS   methimazole  5 mg Oral Daily   metoprolol succinate  100 mg Oral Daily   multivitamin with minerals  1 tablet Oral Daily   polyethylene glycol  17 g Oral Daily   senna-docusate  1 tablet Oral BID   simvastatin  20 mg Oral QHS   zinc sulfate  220 mg Oral Daily   PRN Meds: albuterol, alum & mag hydroxide-simeth, bisacodyl, diclofenac Sodium, guaiFENesin-dextromethorphan, menthol-cetylpyridinium **OR** phenol, methocarbamol **OR** [DISCONTINUED] methocarbamol (ROBAXIN) IV, morphine injection, ondansetron **OR** ondansetron (ZOFRAN) IV, oxyCODONE Continuous Infusions:    LOS: 8 days  Time spent: Greater than  50% of the 35 minute visit was spent in counseling/coordination of care for the patient as laid out in the A&P.   Dwyane Dee, MD [facility] 09/03/2020, 1:48 PM

## 2020-09-03 NOTE — TOC Progression Note (Signed)
Transition of Care [facility]) - Progression Note    Patient Details  Name: Kim Orr MRN: [id number] Date of Birth: 1940/05/28  Transition [facility] Care [facility] CM/SW Contact  Milinda Antis, [place] Phone Number: 09/03/2020, 10:19 AM  Clinical Narrative:    CSW spoke with Claiborne Billings at [facility].  The facility will need rehab notes within the last 24 hours to accept the patient.    PT notified.          Expected Discharge Plan and Services                                                 Social Determinants of Health (SDOH) Interventions    Readmission Risk Interventions No flowsheet data found.

## 2020-09-03 NOTE — Progress Notes (Signed)
Pt refused medication and CBG check. Patient educated and encouraged twice.  Will monitor

## 2020-09-03 NOTE — Progress Notes (Signed)
Pt refused most of her meds this shift & CBG monitoring ; daughter at bedside aware.

## 2020-09-04 DIAGNOSIS — R5383 Other fatigue: Secondary | ICD-10-CM | POA: Diagnosis not present

## 2020-09-04 DIAGNOSIS — U071 COVID-19: Secondary | ICD-10-CM | POA: Diagnosis not present

## 2020-09-04 DIAGNOSIS — I5043 Acute on chronic combined systolic (congestive) and diastolic (congestive) heart failure: Secondary | ICD-10-CM | POA: Diagnosis not present

## 2020-09-04 DIAGNOSIS — R52 Pain, unspecified: Secondary | ICD-10-CM | POA: Diagnosis not present

## 2020-09-04 DIAGNOSIS — E0501 Thyrotoxicosis with diffuse goiter with thyrotoxic crisis or storm: Secondary | ICD-10-CM | POA: Diagnosis not present

## 2020-09-04 DIAGNOSIS — S7222XD Displaced subtrochanteric fracture of left femur, subsequent encounter for closed fracture with routine healing: Secondary | ICD-10-CM | POA: Diagnosis not present

## 2020-09-04 DIAGNOSIS — Z7401 Bed confinement status: Secondary | ICD-10-CM | POA: Diagnosis not present

## 2020-09-04 DIAGNOSIS — G4700 Insomnia, unspecified: Secondary | ICD-10-CM | POA: Diagnosis not present

## 2020-09-04 DIAGNOSIS — M1712 Unilateral primary osteoarthritis, left knee: Secondary | ICD-10-CM | POA: Diagnosis not present

## 2020-09-04 DIAGNOSIS — I4821 Permanent atrial fibrillation: Secondary | ICD-10-CM | POA: Diagnosis not present

## 2020-09-04 DIAGNOSIS — R5381 Other malaise: Secondary | ICD-10-CM | POA: Diagnosis not present

## 2020-09-04 DIAGNOSIS — I4892 Unspecified atrial flutter: Secondary | ICD-10-CM | POA: Diagnosis not present

## 2020-09-04 DIAGNOSIS — J9601 Acute respiratory failure with hypoxia: Secondary | ICD-10-CM | POA: Diagnosis not present

## 2020-09-04 DIAGNOSIS — S7222XA Displaced subtrochanteric fracture of left femur, initial encounter for closed fracture: Secondary | ICD-10-CM | POA: Diagnosis not present

## 2020-09-04 DIAGNOSIS — S72022D Displaced fracture of epiphysis (separation) (upper) of left femur, subsequent encounter for closed fracture with routine healing: Secondary | ICD-10-CM | POA: Diagnosis not present

## 2020-09-04 DIAGNOSIS — K5903 Drug induced constipation: Secondary | ICD-10-CM | POA: Diagnosis not present

## 2020-09-04 DIAGNOSIS — E0590 Thyrotoxicosis, unspecified without thyrotoxic crisis or storm: Secondary | ICD-10-CM | POA: Diagnosis not present

## 2020-09-04 DIAGNOSIS — M25511 Pain in right shoulder: Secondary | ICD-10-CM | POA: Diagnosis not present

## 2020-09-04 DIAGNOSIS — Z743 Need for continuous supervision: Secondary | ICD-10-CM | POA: Diagnosis not present

## 2020-09-04 DIAGNOSIS — E785 Hyperlipidemia, unspecified: Secondary | ICD-10-CM | POA: Diagnosis not present

## 2020-09-04 DIAGNOSIS — G609 Hereditary and idiopathic neuropathy, unspecified: Secondary | ICD-10-CM | POA: Diagnosis not present

## 2020-09-04 DIAGNOSIS — M79605 Pain in left leg: Secondary | ICD-10-CM | POA: Diagnosis not present

## 2020-09-04 DIAGNOSIS — S79929A Unspecified injury of unspecified thigh, initial encounter: Secondary | ICD-10-CM | POA: Diagnosis not present

## 2020-09-04 DIAGNOSIS — M1711 Unilateral primary osteoarthritis, right knee: Secondary | ICD-10-CM | POA: Diagnosis not present

## 2020-09-04 DIAGNOSIS — S72002D Fracture of unspecified part of neck of left femur, subsequent encounter for closed fracture with routine healing: Secondary | ICD-10-CM | POA: Diagnosis not present

## 2020-09-04 DIAGNOSIS — M6281 Muscle weakness (generalized): Secondary | ICD-10-CM | POA: Diagnosis not present

## 2020-09-04 DIAGNOSIS — R600 Localized edema: Secondary | ICD-10-CM | POA: Diagnosis not present

## 2020-09-04 DIAGNOSIS — I509 Heart failure, unspecified: Secondary | ICD-10-CM | POA: Diagnosis not present

## 2020-09-04 DIAGNOSIS — E782 Mixed hyperlipidemia: Secondary | ICD-10-CM | POA: Diagnosis not present

## 2020-09-04 DIAGNOSIS — I10 Essential (primary) hypertension: Secondary | ICD-10-CM | POA: Diagnosis not present

## 2020-09-04 DIAGNOSIS — E669 Obesity, unspecified: Secondary | ICD-10-CM | POA: Diagnosis not present

## 2020-09-04 LAB — GLUCOSE, CAPILLARY
Glucose-Capillary: 76 mg/dL (ref 70–99)
Glucose-Capillary: 111 mg/dL — ABNORMAL HIGH (ref 70–99)

## 2020-09-04 MED ORDER — FERROUS SULFATE 325 (65 FE) MG PO TABS: 325.0000 mg | ORAL_TABLET | Freq: Every day | ORAL | 3 refills | Status: DC

## 2020-09-04 NOTE — TOC Transition Note (Signed)
Transition of Care [facility]) - CM/SW Discharge Note   Patient Details  Name: ADEAN RAIOLA MRN: [id number] Date of Birth: 1940-06-19  Transition of Care [facility]) CM/SW Contact:  Milinda Antis, [place] Phone Number: 09/04/2020, 11:50 AM   Clinical Narrative:    Patient will DC to: [facility]  Anticipated DC date: 09/04/2020 Family notified: Yes Transport by: Corey Harold   Per MD patient ready for DC to . RN to call report prior to discharge [phone]. RN, patient, patient's family, and facility notified of DC. Discharge Summary and FL2 sent to facility. DC packet on chart. Ambulance transport will be requested for patient when RN reports that the patient is ready.   CSW will sign off for now as social work intervention is no longer needed. Please consult [place] again if new needs arise.     Final next level of care: Skilled Nursing Facility Barriers to Discharge: Barriers Resolved   Patient Goals and CMS Choice        Discharge Placement              Patient chooses bed at:  [facility]) Patient to be transferred to facility by: [place] Name of family member notified: Judyann Munson (Daughter)   [phone] Patient and family notified of of transfer: 09/04/20  Discharge Plan and Services                                     Social Determinants of Health (SDOH) Interventions     Readmission Risk Interventions No flowsheet data found.

## 2020-09-04 NOTE — Discharge Summary (Signed)
Physician Discharge Summary   ARIANE HOWINGTON [id number] DOB: January 01, 1941 DOA: 08/26/2020  PCP: Lajean Manes, MD  Admit date: 08/26/2020 Discharge date: 09/04/2020  Admitted From: home Disposition:  SNF Discharging physician: Dwyane Dee, MD  Recommendations for Outpatient Follow-up:  Follow up with orthopedic surgery; 2 weeks post op, needs appt made for first week of Aug if not already done  Home Health:  Equipment/Devices:   Patient discharged to SNF in Discharge Condition: stable Risk of unplanned readmission score: Unplanned Admission- Pilot do not use: 17  CODE STATUS: Full Diet recommendation:  Diet Orders (From admission, onward)     Start     Ordered   09/04/20 0000  Diet general        09/04/20 1104   08/28/20 0027  Diet regular Room service appropriate? Yes; Fluid consistency: Thin  Diet effective now       Question Answer Comment  Room service appropriate? Yes   Fluid consistency: Thin      08/28/20 0026            Hospital Course: Ms. Larivee is an 80 year old female with HTN, HLD, obesity, hyperthyroidism who presented to the hospital after mechanical fall at home. After evaluation she was found to have an acute moderately displaced and angulated subtrochanteric fracture of the proximal left femur.  She was evaluated by orthopedic surgery and underwent ORIF on 08/27/2020. During evaluation on admission she was also found to be hypoxic and positive for COVID-19.  She was started on remdesivir and Decadron.  She is not typically on oxygen at home. She was treated with 5 days remdesivir and 10 days decadron in the hospital and weaned down to room air prior to discharge.  See below for further A&P.   * Closed left subtrochanteric femur fracture ([place]) - "Acute moderately displaced and angulated subtrochanteric fracture of the proximal left femur." - s/p ORIF on 7/18 with ortho - continue pain control - follow up PT evals: SNF recommended. Discharge  to [facility] once bed avail and insurance approves  Acute pain-resolved as of 09/01/2020 - not on chronic opioids at home; database reviewed as well - pain seems to be 2/2 femur fx and surgery; hope is that it should slowly improve over time - due to her pain, she has refused some therapies and also made comments to staff about "ready to die" and variations of that. Personally discussed with patient on 7/21. She does not have true suicidal ideation nor has there be any attempt, therefore has no technical means either; her distraught is driven by pain and her regimen is being further adjusted to try and help with this - also called and discussed this with her daughter, [name], on 7/21 - pain remained much improved the following days with no further conversations such as above  Acute respiratory failure with hypoxia (HCC)-resolved as of 09/01/2020 - Presumed due to COVID initially however given significant immobility, body habitus, and recent surgery she may have a component of compressive atelectasis as well as possible underlying OSA/OHS -At this point, she may start to become oxygen dependent until her mobility can significantly improve which was poor prior to admission or her fracture already - wean O2 as able; seems to require oxygen still intermittently.  This can easily be arranged for continuing at discharge as she is starting to develop more need for it once again - for now she remains on RA again as of 7/22  COVID-19 virus infection-resolved as of 09/04/2020 -  remdesivir (  5 days completed 7/21) and decadron x 10 days completed on 7/26  Hyperthyroidism - continue methimazole   Class 2 obesity due to excess calories with body mass index (BMI) of 37.0 to 37.9 in adult Body mass index is 37.54 kg/m. - contributes to co-morbidities and healing   Atrial fibrillation, chronic (HCC) - on Eliquis at home - continue metoprolol  Mixed hyperlipidemia - continue statin   Essential  hypertension - continue Toprol   Principal Diagnosis: Closed left subtrochanteric femur fracture [facility])  Discharge Diagnoses: Active Hospital Problems   Diagnosis Date Noted   Closed left subtrochanteric femur fracture ([place]) 08/28/2020    Priority: High   Hyperthyroidism 05/03/2020    Priority: Low   Atrial fibrillation, chronic ([place]) 08/26/2020   Class 2 obesity due to excess calories with body mass index (BMI) of 37.0 to 37.9 in adult 08/26/2020   Essential hypertension 07/01/2017   Mixed hyperlipidemia 07/01/2017    Resolved Hospital Problems   Diagnosis Date Noted Date Resolved   Acute pain 08/30/2020 09/01/2020    Priority: Medium   COVID-19 virus infection 08/28/2020 09/04/2020    Priority: Medium   Acute respiratory failure with hypoxia [facility]) 08/28/2020 09/01/2020    Priority: Medium    Discharge Instructions     Diet general   Complete by: As directed    Discharge wound care:   Complete by: As directed    Reinforce dressing   Increase activity slowly   Complete by: As directed       Allergies as of 09/04/2020   No Known Allergies      Medication List     TAKE these medications    apixaban 5 MG Tabs tablet Commonly known as: ELIQUIS Take 1 tablet (5 mg total) by mouth 2 (two) times daily.   calcium carbonate 500 MG chewable tablet Commonly known as: TUMS - dosed in mg elemental calcium Chew 2 tablets by mouth in the morning.   Centrum Silver 50+Women Tabs Take 1 tablet by mouth daily.   cyclobenzaprine 10 MG tablet Commonly known as: FLEXERIL Take 10 mg by mouth 3 (three) times daily as needed for muscle spasms.   diclofenac Sodium 1 % Gel Commonly known as: VOLTAREN Apply 2 g topically daily as needed (for pain).   ferrous sulfate 325 (65 FE) MG tablet Take 1 tablet (325 mg total) by mouth daily with breakfast. Start taking on: September 05, 2020   gabapentin 300 MG capsule Commonly known as: NEURONTIN Take 300-600 mg by mouth See admin  instructions. Take 300 mg by mouth in the morning and 600 mg at bedtime   Melatonin 10 MG Tabs Take 10 mg by mouth at bedtime.   methimazole 5 MG tablet Commonly known as: TAPAZOLE Take 5 mg by mouth daily.   metoprolol succinate 100 MG 24 hr tablet Commonly known as: TOPROL-XL Take 1 tablet (100 mg total) by mouth daily. Take with or immediately following a meal.   oxyCODONE 5 MG immediate release tablet Commonly known as: Roxicodone Take 1-2 tablets (5-10 mg total) by mouth every 4 (four) hours as needed for severe pain.   potassium chloride SA 20 MEQ tablet Commonly known as: KLOR-CON Take 2 tablets (40 mEq total) by mouth in the morning.   raloxifene 60 MG tablet Commonly known as: EVISTA Take 60 mg by mouth at bedtime.   senna-docusate 8.6-50 MG tablet Commonly known as: Senokot-S Take 2 tablets by mouth See admin instructions. Take 2 tablets by mouth in the  evening as needed for constipation   simvastatin 20 MG tablet Commonly known as: ZOCOR Take 20 mg by mouth at bedtime.   Torsemide 40 MG Tabs Take 1 tablet by mouth daily.               Discharge Care Instructions  (From admission, onward)           Start     Ordered   09/04/20 0000  Discharge wound care:       Comments: Reinforce dressing   09/04/20 1104            Follow-up Information     Marchia Bond, MD. Schedule an appointment as soon as possible for a visit in 1 week(s).   Specialty: Orthopedic Surgery Contact information: [address] [place] [address] [phone]                No Known Allergies  Consultations: Orthopedic surgery  Discharge Exam: BP (!) 121/58 (BP Location: Right Arm)   Pulse 73   Temp 99.4 F (37.4 C) (Oral)   Resp 15   Ht '5\' 11"'$  (1.803 m)   Wt 122.1 kg   SpO2 98%   BMI 37.54 kg/m  General appearance: alert, cooperative, and no distress Head: Normocephalic, without obvious abnormality, atraumatic Eyes:   EOMI Lungs: clear to auscultation bilaterally Heart: regular rate and rhythm and S1, S2 normal Abdomen: normal findings: bowel sounds normal and soft, non-tender Extremities:  Left hip surgical dressings in place and compartments soft ; chronic 3+ LE edema with stasis dermatitis in RLE Skin: mobility and turgor normal Neurologic: no focal deficits; global deconditioning and LLE more weak from surgery  The results of significant diagnostics from this hospitalization (including imaging, microbiology, ancillary and laboratory) are listed below for reference.   Microbiology: Recent Results (from the past 240 hour(s))  Resp Panel by RT-PCR (Flu A&B, Covid) Nasopharyngeal Swab     Status: Abnormal   Collection Time: 08/26/20 12:35 PM   Specimen: Nasopharyngeal Swab; Nasopharyngeal(NP) swabs in vial transport medium  Result Value Ref Range Status   SARS Coronavirus 2 by RT PCR POSITIVE (A) NEGATIVE Final    Comment: RESULT CALLED TO, READ BACK BY AND VERIFIED WITH: RN E.BANKS ON [id number] AT 1339 BY E.PARRISH (NOTE) SARS-CoV-2 target nucleic acids are DETECTED.  The SARS-CoV-2 RNA is generally detectable in upper respiratory specimens during the acute phase of infection. Positive results are indicative of the presence of the identified virus, but do not rule out bacterial infection or co-infection with other pathogens not detected by the test. Clinical correlation with patient history and other diagnostic information is necessary to determine patient infection status. The expected result is Negative.  Fact Sheet for Patients: EntrepreneurPulse.com.au  Fact Sheet for Healthcare Providers: IncredibleEmployment.be  This test is not yet approved or cleared by the [place] FDA and  has been authorized for detection and/or diagnosis of SARS-CoV-2 by FDA under an Emergency Use Authorization (EUA).  This EUA will remain in effect (meaning this t est can  be used) for the duration of  the COVID-19 declaration under Section 564(b)(1) of the Act, 21 U.S.C. section 360bbb-3(b)(1), unless the authorization is terminated or revoked sooner.     Influenza A by PCR NEGATIVE NEGATIVE Final   Influenza B by PCR NEGATIVE NEGATIVE Final    Comment: (NOTE) The Xpert Xpress SARS-CoV-2/FLU/RSV plus assay is intended as an aid in the diagnosis of influenza from Nasopharyngeal swab specimens and should  not be used as a sole basis for treatment. Nasal washings and aspirates are unacceptable for Xpert Xpress SARS-CoV-2/FLU/RSV testing.  Fact Sheet for Patients: EntrepreneurPulse.com.au  Fact Sheet for Healthcare Providers: IncredibleEmployment.be  This test is not yet approved or cleared by the [place] FDA and has been authorized for detection and/or diagnosis of SARS-CoV-2 by FDA under an Emergency Use Authorization (EUA). This EUA will remain in effect (meaning this test can be used) for the duration of the COVID-19 declaration under Section 564(b)(1) of the Act, 21 U.S.C. section 360bbb-3(b)(1), unless the authorization is terminated or revoked.  Performed at [facility], [place] [address], [place], [place] [address]   Surgical pcr screen     Status: None   Collection Time: 08/27/20  9:01 AM   Specimen: Nasal Mucosa; Nasal Swab  Result Value Ref Range Status   MRSA, PCR NEGATIVE NEGATIVE Final   Staphylococcus aureus NEGATIVE NEGATIVE Final    Comment: (NOTE) The Xpert SA Assay (FDA approved for NASAL specimens in patients 36 years of age and older), is one component of a comprehensive surveillance program. It is not intended to diagnose infection nor to guide or monitor treatment. Performed at [facility], [place] [address], [place], [place] [address]      Labs: BNP (last 3 results) Recent Labs    05/03/20 1751 08/26/20 1103  BNP 242.4* [id number]   Basic Metabolic Panel: Recent Labs   Lab 08/29/20 0043 08/30/20 0241 08/31/20 0421  NA 136 136 138  K 4.2 4.4 3.7  CL 103 101 100  CO2 '28 30 31  '$ GLUCOSE 137* 148* 124*  BUN '12 11 15  '$ CREATININE 0.59 0.62 0.60  CALCIUM 8.3* 8.6* 9.0  MG 2.1 2.2 2.2   Liver Function Tests: Recent Labs  Lab 08/29/20 0043 08/30/20 0241 08/31/20 0421  AST 35 28 20  ALT '23 19 15  '$ ALKPHOS 52 56 45  BILITOT 0.6 0.8 1.1  PROT 4.5* 4.8* 5.3*  ALBUMIN 2.7* 2.7* 3.4*   No results for input(s): LIPASE, AMYLASE in the last 168 hours. No results for input(s): AMMONIA in the last 168 hours. CBC: Recent Labs  Lab 08/29/20 0043 08/30/20 0241 08/31/20 0421  WBC 8.0 8.8 8.3  NEUTROABS 6.5 6.9 6.1  HGB 7.9* 8.9* 8.3*  HCT 24.3* 27.0* 24.8*  MCV 103.4* 101.9* 101.2*  PLT 184 187 209   Cardiac Enzymes: No results for input(s): CKTOTAL, CKMB, CKMBINDEX, TROPONINI in the last 168 hours. BNP: Invalid input(s): POCBNP CBG: Recent Labs  Lab 09/02/20 1113 09/02/20 1720 09/03/20 1650 09/03/20 2122 09/04/20 0650  GLUCAP 171* 122* 110* 113* 76   D-Dimer No results for input(s): DDIMER in the last 72 hours. Hgb A1c No results for input(s): HGBA1C in the last 72 hours. Lipid Profile No results for input(s): CHOL, HDL, LDLCALC, TRIG, CHOLHDL, LDLDIRECT in the last 72 hours. Thyroid function studies No results for input(s): TSH, T4TOTAL, T3FREE, THYROIDAB in the last 72 hours.  Invalid input(s): FREET3 Anemia work up No results for input(s): VITAMINB12, FOLATE, FERRITIN, TIBC, IRON, RETICCTPCT in the last 72 hours. Urinalysis No results found for: COLORURINE, APPEARANCEUR, [place], [place], [place], [place], [place], [place], PROTEINUR, UROBILINOGEN, NITRITE, LEUKOCYTESUR Sepsis Labs Invalid input(s): PROCALCITONIN,  WBC,  LACTICIDVEN Microbiology Recent Results (from the past 240 hour(s))  Resp Panel by RT-PCR (Flu A&B, Covid) Nasopharyngeal Swab     Status: Abnormal   Collection Time: 08/26/20 12:35 PM   Specimen:  Nasopharyngeal Swab; Nasopharyngeal(NP) swabs in vial  transport medium  Result Value Ref Range Status   SARS Coronavirus 2 by RT PCR POSITIVE (A) NEGATIVE Final    Comment: RESULT CALLED TO, READ BACK BY AND VERIFIED WITH: RN E.BANKS ON [id number] AT 1339 BY E.PARRISH (NOTE) SARS-CoV-2 target nucleic acids are DETECTED.  The SARS-CoV-2 RNA is generally detectable in upper respiratory specimens during the acute phase of infection. Positive results are indicative of the presence of the identified virus, but do not rule out bacterial infection or co-infection with other pathogens not detected by the test. Clinical correlation with patient history and other diagnostic information is necessary to determine patient infection status. The expected result is Negative.  Fact Sheet for Patients: EntrepreneurPulse.com.au  Fact Sheet for Healthcare Providers: IncredibleEmployment.be  This test is not yet approved or cleared by the [place] FDA and  has been authorized for detection and/or diagnosis of SARS-CoV-2 by FDA under an Emergency Use Authorization (EUA).  This EUA will remain in effect (meaning this t est can be used) for the duration of  the COVID-19 declaration under Section 564(b)(1) of the Act, 21 U.S.C. section 360bbb-3(b)(1), unless the authorization is terminated or revoked sooner.     Influenza A by PCR NEGATIVE NEGATIVE Final   Influenza B by PCR NEGATIVE NEGATIVE Final    Comment: (NOTE) The Xpert Xpress SARS-CoV-2/FLU/RSV plus assay is intended as an aid in the diagnosis of influenza from Nasopharyngeal swab specimens and should not be used as a sole basis for treatment. Nasal washings and aspirates are unacceptable for Xpert Xpress SARS-CoV-2/FLU/RSV testing.  Fact Sheet for Patients: EntrepreneurPulse.com.au  Fact Sheet for Healthcare Providers: IncredibleEmployment.be  This test is not yet  approved or cleared by the [place] FDA and has been authorized for detection and/or diagnosis of SARS-CoV-2 by FDA under an Emergency Use Authorization (EUA). This EUA will remain in effect (meaning this test can be used) for the duration of the COVID-19 declaration under Section 564(b)(1) of the Act, 21 U.S.C. section 360bbb-3(b)(1), unless the authorization is terminated or revoked.  Performed at [facility], [place] [address], [place], [place] [address]   Surgical pcr screen     Status: None   Collection Time: 08/27/20  9:01 AM   Specimen: Nasal Mucosa; Nasal Swab  Result Value Ref Range Status   MRSA, PCR NEGATIVE NEGATIVE Final   Staphylococcus aureus NEGATIVE NEGATIVE Final    Comment: (NOTE) The Xpert SA Assay (FDA approved for NASAL specimens in patients 38 years of age and older), is one component of a comprehensive surveillance program. It is not intended to diagnose infection nor to guide or monitor treatment. Performed at [facility], [place] [address] [place], [place] [address]     Procedures/Studies: CT Head Wo Contrast  Result Date: 08/26/2020 CLINICAL DATA:  Poly trauma EXAM: CT HEAD WITHOUT CONTRAST TECHNIQUE: Contiguous axial images were obtained from the base of the skull through the vertex without intravenous contrast. COMPARISON:  None. FINDINGS: Brain: No evidence of acute infarction, hemorrhage, hydrocephalus, extra-axial collection or mass lesion/mass effect. Vascular: No hyperdense vessel or unexpected calcification. Skull: Normal. Negative for fracture or focal lesion. Sinuses/Orbits: Mild polypoid mucosal thickening of the maxillary sinuses. Other: None. IMPRESSION: No acute intracranial abnormality. Electronically Signed   By: Fidela Salisbury M.D.   On: 08/26/2020 12:14   DG Pelvis Portable  Result Date: 08/30/2020 CLINICAL DATA:  Left hip fracture status post ORIF EXAM: PORTABLE PELVIS 1-2 VIEWS COMPARISON:  08/27/2020, 08/26/2020  FINDINGS: Single frontal  view of the pelvis demonstrates interval placement of an intramedullary rod and proximal dynamic screw traversing the comminuted proximal left femoral fracture seen previously. Alignment is near anatomic. Overlying soft tissue swelling consistent with postsurgical change. IMPRESSION: 1. ORIF of a proximal left femoral fracture with near anatomic alignment. Electronically Signed   By: Randa Ngo M.D.   On: 08/30/2020 21:06   DG CHEST PORT 1 VIEW  Result Date: 08/26/2020 CLINICAL DATA:  Left hip fracture, COVID-19 positive EXAM: PORTABLE CHEST 1 VIEW COMPARISON:  05/08/2020 FINDINGS: Single frontal view of the chest demonstrates an unremarkable cardiac silhouette. Stable ectasia of the thoracic aorta. No airspace disease, effusion, or pneumothorax. Mild chronic scarring at the lung bases. No acute bony abnormalities. IMPRESSION: 1. Stable chest, no acute process. Electronically Signed   By: Randa Ngo M.D.   On: 08/26/2020 19:34   DG C-Arm 1-60 Min  Result Date: 08/27/2020 CLINICAL DATA:  Left femoral ORIF EXAM: LEFT FEMUR 2 VIEWS; DG C-ARM 1-60 MIN COMPARISON:  11:29 a.m. FINDINGS: Six fluoroscopic intraoperative radiographs of the left hip demonstrate ORIF of a subtrochanteric femoral fracture utilizing a intramedullary rod with proximal and distal interlocking screws. Fracture fragments are in grossly anatomic alignment on this limited examination. No unexpected fracture or dislocation. Moderate left hip degenerative arthritis noted. Extensive heterotopic ossification surrounds the left hip. Fluoroscopy time: 2 minutes 12 seconds Fluoroscopic images: 6 Dose: Not provided, refer to operative report IMPRESSION: Left femoral ORIF as described above Electronically Signed   By: Fidela Salisbury MD   On: 08/27/2020 21:33   DG Hip Unilat With Pelvis 2-3 Views Left  Result Date: 08/26/2020 CLINICAL DATA:  Fall with left hip pain EXAM: DG HIP (WITH OR WITHOUT PELVIS) 2-3V LEFT  COMPARISON:  None. FINDINGS: Acute moderately displaced and angulated subtrochanteric fracture of the proximal left femur. Fracture involvement of the intertrochanteric region difficult to exclude. Moderate osteoarthritis of both hips. Extensive heterotopic ossification about the left hip. No dislocation. No additional fractures are identified. IMPRESSION: Acute moderately displaced and angulated subtrochanteric fracture of the proximal left femur. Electronically Signed   By: Davina Poke D.O.   On: 08/26/2020 12:05   DG FEMUR MIN 2 VIEWS LEFT  Result Date: 08/27/2020 CLINICAL DATA:  Left femoral ORIF EXAM: LEFT FEMUR 2 VIEWS; DG C-ARM 1-60 MIN COMPARISON:  11:29 a.m. FINDINGS: Six fluoroscopic intraoperative radiographs of the left hip demonstrate ORIF of a subtrochanteric femoral fracture utilizing a intramedullary rod with proximal and distal interlocking screws. Fracture fragments are in grossly anatomic alignment on this limited examination. No unexpected fracture or dislocation. Moderate left hip degenerative arthritis noted. Extensive heterotopic ossification surrounds the left hip. Fluoroscopy time: 2 minutes 12 seconds Fluoroscopic images: 6 Dose: Not provided, refer to operative report IMPRESSION: Left femoral ORIF as described above Electronically Signed   By: Fidela Salisbury MD   On: 08/27/2020 21:33   DG FEMUR PORT MIN 2 VIEWS LEFT  Result Date: 08/30/2020 CLINICAL DATA:  Left hip ORIF EXAM: LEFT FEMUR PORTABLE 2 VIEWS COMPARISON:  08/27/2019 FINDINGS: Frontal and cross-table lateral views of the left femur are obtained. Intramedullary rod with proximal dynamic and distal interlocking screw traverses a comminuted proximal left femur fracture. Alignment is near anatomic. Postsurgical changes in the overlying soft tissues. IMPRESSION: 1. ORIF of a proximal left femoral fracture as above. Electronically Signed   By: Randa Ngo M.D.   On: 08/30/2020 21:06     Time coordinating discharge:  Over 30 minutes    Shanon Brow  Sharone Picchi, MD  [facility] 09/04/2020, 11:05 AM

## 2020-09-04 NOTE — Progress Notes (Signed)
Occupational Therapy Treatment Patient Details Name: Kim Orr MRN: [id number] DOB: [birth year] Today's Date: 09/04/2020    History of present illness 80 yo female presents to [facility] from [facility] on 7/17 for fall. Pt sustained Acute moderately displaced and angulated subtrochanteric fracture of  the proximal left femur. s/p L femur IMN introtrochantric on 7/18. Recent admission 04/2020 for aflutter with RVR. PMH includes chronic pain syndrome, sciatica, HTN, lymphedema, morbid obesity, HLD, OA, lap chole.   OT comments  Pt presented with in bed with daughter in the room. Pt completed supine to sitting with moderate assist, HOB elevated and step by step cues. Pt then completed while sitting at EOB self feeding and hygiene with set up. Attempted to engage in sit to stand from elevated surface with about 4 in clearance from bed level. Pt then completed sitting to supine with max assist . Pt currently with functional limitations due to the deficits listed below (see OT Problem List).  Pt will benefit from skilled OT to increase their safety and independence with ADL and functional mobility for ADL to facilitate discharge to venue listed below.    Follow Up Recommendations  SNF;Supervision/Assistance - 24 hour    Equipment Recommendations  Other (comment)    Recommendations for Other Services      Precautions / Restrictions Precautions Precautions: Fall Restrictions LLE Weight Bearing: Weight bearing as tolerated       Mobility Bed Mobility Overal bed mobility: Needs Assistance Bed Mobility: Rolling;Supine to Sit;Sit to Supine Rolling: Mod assist;Max assist;+2 for physical assistance;+2 for safety/equipment Sidelying to sit: Mod assist;HOB elevated Supine to sit: Max assist Sit to supine: Max assist;HOB elevated        Transfers Overall transfer level: Needs assistance   Transfers: Lateral/Scoot Transfers          Lateral/Scoot Transfers: Max assist       Balance Overall balance assessment: Needs assistance;History of Falls Sitting-balance support: Feet supported;Single extremity supported Sitting balance-Leahy Scale: Fair                                     ADL either performed or assessed with clinical judgement   ADL Overall ADL's : Needs assistance/impaired Eating/Feeding: Independent;Sitting   Grooming: Wash/dry hands;Wash/dry face;Set up;Sitting   Upper Body Bathing: Minimal assistance;Cueing for safety;Cueing for sequencing;Sitting   Lower Body Bathing: Total assistance;+2 for physical assistance;+2 for safety/equipment;Bed level   Upper Body Dressing : Min guard;Cueing for safety;Cueing for sequencing;Sitting   Lower Body Dressing: Total assistance;+2 for physical assistance;+2 for safety/equipment;Bed level   Toilet Transfer: Total assistance;+2 for physical assistance;+2 for safety/equipment   Toileting- Clothing Manipulation and Hygiene: +2 for physical assistance;Total assistance;+2 for safety/equipment;Bed level               Vision       Perception     Praxis      Cognition Arousal/Alertness: Awake/alert   Overall Cognitive Status:  (Pt reports feeling a little out of sorts)                         Following Commands: Follows one step commands consistently                Exercises     Shoulder Instructions       General Comments      Pertinent Vitals/ Pain  Pain Assessment: Faces Faces Pain Scale: Hurts even more Pain Location: LLE Pain Descriptors / Indicators: Sore;Discomfort;Grimacing;Moaning;Crying;Guarding Pain Intervention(s): Limited activity within patient's tolerance  Home Living                                          Prior Functioning/Environment              Frequency  Min 2X/week        Progress Toward Goals  OT Goals(current goals can now be found in the care plan section)  Progress towards OT goals:  Progressing toward goals  Acute Rehab OT Goals Patient Stated Goal: go home to husband OT Goal Formulation: With patient Time For Goal Achievement: 08/29/20 Potential to Achieve Goals: Fair ADL Goals Pt Will Perform Upper Body Bathing: with set-up;sitting Pt Will Perform Lower Body Bathing: with min guard assist;sitting/lateral leans Pt Will Perform Upper Body Dressing: with modified independence Pt Will Perform Lower Body Dressing: with min guard assist;sit to/from stand Pt Will Transfer to Toilet: stand pivot transfer;with min guard assist;bedside commode  Plan Discharge plan remains appropriate    Co-evaluation                 AM-PAC OT "6 Clicks" Daily Activity     Outcome Measure   Help from another person eating meals?: None Help from another person taking care of personal grooming?: A Little Help from another person toileting, which includes using toliet, bedpan, or urinal?: Total Help from another person bathing (including washing, rinsing, drying)?: A Lot Help from another person to put on and taking off regular upper body clothing?: A Little Help from another person to put on and taking off regular lower body clothing?: Total 6 Click Score: 14    End of Session Equipment Utilized During Treatment: Gait belt;Rolling walker  OT Visit Diagnosis: Other abnormalities of gait and mobility (R26.89);Repeated falls (R29.6);Muscle weakness (generalized) (M62.81);History of falling (Z91.81);Pain   Activity Tolerance Patient limited by fatigue;Patient limited by pain   Patient Left in bed;with call bell/phone within reach;with family/visitor present   Nurse Communication          Time: [id number] OT Time Calculation (min): 48 min  Charges: OT General Charges $OT Visit: 1 Visit OT Treatments $Self Care/Home Management : 38-52 mins  Joeseph Amor OTR/L  Acute Rehab Services  [phone] office number [phone] pager number    Joeseph Amor 09/04/2020,  10:30 AM

## 2020-09-04 NOTE — Consult Note (Signed)
   [facility] Inpatient Consult   09/04/2020  [name] 19-Jul-1940 [id number]  [facility] [ACO] Patient:  Kim Orr Medicare  High risk score for unplanned readmission risk  Reviewed for disposition and patient is for a skilled nursing level of care. Patient was from [place] prior to admission.  Natividad Brood, RN BSN [facility] Liaison  [phone] business mobile phone Toll free office [phone]  Fax number: [phone] [email or web address]

## 2020-09-04 NOTE — Progress Notes (Signed)
[facility] here to transport pt to [facility] ; daughter at bedside; pt in stable condition.Pt report given to [facility] receiving RN earlier.

## 2020-09-06 DIAGNOSIS — E0590 Thyrotoxicosis, unspecified without thyrotoxic crisis or storm: Secondary | ICD-10-CM | POA: Diagnosis not present

## 2020-09-06 DIAGNOSIS — G4700 Insomnia, unspecified: Secondary | ICD-10-CM | POA: Diagnosis not present

## 2020-09-06 DIAGNOSIS — I5043 Acute on chronic combined systolic (congestive) and diastolic (congestive) heart failure: Secondary | ICD-10-CM | POA: Diagnosis not present

## 2020-09-06 DIAGNOSIS — I4892 Unspecified atrial flutter: Secondary | ICD-10-CM | POA: Diagnosis not present

## 2020-09-10 DIAGNOSIS — R600 Localized edema: Secondary | ICD-10-CM | POA: Diagnosis not present

## 2020-09-10 DIAGNOSIS — S72022D Displaced fracture of epiphysis (separation) (upper) of left femur, subsequent encounter for closed fracture with routine healing: Secondary | ICD-10-CM | POA: Diagnosis not present

## 2020-09-10 DIAGNOSIS — M79605 Pain in left leg: Secondary | ICD-10-CM | POA: Diagnosis not present

## 2020-09-11 DIAGNOSIS — S72002D Fracture of unspecified part of neck of left femur, subsequent encounter for closed fracture with routine healing: Secondary | ICD-10-CM | POA: Diagnosis not present

## 2020-09-11 DIAGNOSIS — I4821 Permanent atrial fibrillation: Secondary | ICD-10-CM | POA: Diagnosis not present

## 2020-09-11 DIAGNOSIS — I10 Essential (primary) hypertension: Secondary | ICD-10-CM | POA: Diagnosis not present

## 2020-09-11 DIAGNOSIS — E782 Mixed hyperlipidemia: Secondary | ICD-10-CM | POA: Diagnosis not present

## 2020-09-12 DIAGNOSIS — I10 Essential (primary) hypertension: Secondary | ICD-10-CM | POA: Diagnosis not present

## 2020-09-12 DIAGNOSIS — S72002D Fracture of unspecified part of neck of left femur, subsequent encounter for closed fracture with routine healing: Secondary | ICD-10-CM | POA: Diagnosis not present

## 2020-09-12 DIAGNOSIS — M25511 Pain in right shoulder: Secondary | ICD-10-CM | POA: Diagnosis not present

## 2020-09-12 DIAGNOSIS — M79605 Pain in left leg: Secondary | ICD-10-CM | POA: Diagnosis not present

## 2020-09-12 DIAGNOSIS — M1712 Unilateral primary osteoarthritis, left knee: Secondary | ICD-10-CM | POA: Diagnosis not present

## 2020-09-12 DIAGNOSIS — K5903 Drug induced constipation: Secondary | ICD-10-CM | POA: Diagnosis not present

## 2020-09-18 DIAGNOSIS — S72002D Fracture of unspecified part of neck of left femur, subsequent encounter for closed fracture with routine healing: Secondary | ICD-10-CM | POA: Diagnosis not present

## 2020-09-18 DIAGNOSIS — M79605 Pain in left leg: Secondary | ICD-10-CM | POA: Diagnosis not present

## 2020-09-21 DIAGNOSIS — M79605 Pain in left leg: Secondary | ICD-10-CM | POA: Diagnosis not present

## 2020-09-21 DIAGNOSIS — S72002D Fracture of unspecified part of neck of left femur, subsequent encounter for closed fracture with routine healing: Secondary | ICD-10-CM | POA: Diagnosis not present

## 2020-10-12 DIAGNOSIS — M1711 Unilateral primary osteoarthritis, right knee: Secondary | ICD-10-CM | POA: Diagnosis not present

## 2020-10-12 DIAGNOSIS — S7222XD Displaced subtrochanteric fracture of left femur, subsequent encounter for closed fracture with routine healing: Secondary | ICD-10-CM | POA: Diagnosis not present

## 2020-10-13 DIAGNOSIS — M6281 Muscle weakness (generalized): Secondary | ICD-10-CM | POA: Diagnosis not present

## 2020-10-13 DIAGNOSIS — S72002D Fracture of unspecified part of neck of left femur, subsequent encounter for closed fracture with routine healing: Secondary | ICD-10-CM | POA: Diagnosis not present

## 2020-10-15 DIAGNOSIS — M6281 Muscle weakness (generalized): Secondary | ICD-10-CM | POA: Diagnosis not present

## 2020-10-15 DIAGNOSIS — S72002D Fracture of unspecified part of neck of left femur, subsequent encounter for closed fracture with routine healing: Secondary | ICD-10-CM | POA: Diagnosis not present

## 2020-10-16 DIAGNOSIS — M6281 Muscle weakness (generalized): Secondary | ICD-10-CM | POA: Diagnosis not present

## 2020-10-16 DIAGNOSIS — S72002D Fracture of unspecified part of neck of left femur, subsequent encounter for closed fracture with routine healing: Secondary | ICD-10-CM | POA: Diagnosis not present

## 2020-10-17 DIAGNOSIS — E569 Vitamin deficiency, unspecified: Secondary | ICD-10-CM | POA: Diagnosis not present

## 2020-10-17 DIAGNOSIS — M6281 Muscle weakness (generalized): Secondary | ICD-10-CM | POA: Diagnosis not present

## 2020-10-17 DIAGNOSIS — S72002D Fracture of unspecified part of neck of left femur, subsequent encounter for closed fracture with routine healing: Secondary | ICD-10-CM | POA: Diagnosis not present

## 2020-10-18 DIAGNOSIS — M6281 Muscle weakness (generalized): Secondary | ICD-10-CM | POA: Diagnosis not present

## 2020-10-18 DIAGNOSIS — M79605 Pain in left leg: Secondary | ICD-10-CM | POA: Diagnosis not present

## 2020-10-18 DIAGNOSIS — E876 Hypokalemia: Secondary | ICD-10-CM | POA: Diagnosis not present

## 2020-10-18 DIAGNOSIS — S72002D Fracture of unspecified part of neck of left femur, subsequent encounter for closed fracture with routine healing: Secondary | ICD-10-CM | POA: Diagnosis not present

## 2020-10-18 DIAGNOSIS — R112 Nausea with vomiting, unspecified: Secondary | ICD-10-CM | POA: Diagnosis not present

## 2020-10-19 DIAGNOSIS — S72002D Fracture of unspecified part of neck of left femur, subsequent encounter for closed fracture with routine healing: Secondary | ICD-10-CM | POA: Diagnosis not present

## 2020-10-19 DIAGNOSIS — M6281 Muscle weakness (generalized): Secondary | ICD-10-CM | POA: Diagnosis not present

## 2020-10-20 DIAGNOSIS — M6281 Muscle weakness (generalized): Secondary | ICD-10-CM | POA: Diagnosis not present

## 2020-10-20 DIAGNOSIS — S72002D Fracture of unspecified part of neck of left femur, subsequent encounter for closed fracture with routine healing: Secondary | ICD-10-CM | POA: Diagnosis not present

## 2020-10-22 DIAGNOSIS — S72002D Fracture of unspecified part of neck of left femur, subsequent encounter for closed fracture with routine healing: Secondary | ICD-10-CM | POA: Diagnosis not present

## 2020-10-22 DIAGNOSIS — E569 Vitamin deficiency, unspecified: Secondary | ICD-10-CM | POA: Diagnosis not present

## 2020-10-22 DIAGNOSIS — M6281 Muscle weakness (generalized): Secondary | ICD-10-CM | POA: Diagnosis not present

## 2020-10-23 DIAGNOSIS — M79604 Pain in right leg: Secondary | ICD-10-CM | POA: Diagnosis not present

## 2020-10-23 DIAGNOSIS — S72002D Fracture of unspecified part of neck of left femur, subsequent encounter for closed fracture with routine healing: Secondary | ICD-10-CM | POA: Diagnosis not present

## 2020-10-23 DIAGNOSIS — M6281 Muscle weakness (generalized): Secondary | ICD-10-CM | POA: Diagnosis not present

## 2020-10-23 DIAGNOSIS — K649 Unspecified hemorrhoids: Secondary | ICD-10-CM | POA: Diagnosis not present

## 2020-10-23 DIAGNOSIS — K5903 Drug induced constipation: Secondary | ICD-10-CM | POA: Diagnosis not present

## 2020-10-24 DIAGNOSIS — M6281 Muscle weakness (generalized): Secondary | ICD-10-CM | POA: Diagnosis not present

## 2020-10-24 DIAGNOSIS — S72002D Fracture of unspecified part of neck of left femur, subsequent encounter for closed fracture with routine healing: Secondary | ICD-10-CM | POA: Diagnosis not present

## 2020-10-25 DIAGNOSIS — I509 Heart failure, unspecified: Secondary | ICD-10-CM | POA: Diagnosis not present

## 2020-10-25 DIAGNOSIS — I4892 Unspecified atrial flutter: Secondary | ICD-10-CM | POA: Diagnosis not present

## 2020-10-25 DIAGNOSIS — S72002D Fracture of unspecified part of neck of left femur, subsequent encounter for closed fracture with routine healing: Secondary | ICD-10-CM | POA: Diagnosis not present

## 2020-10-25 DIAGNOSIS — M6281 Muscle weakness (generalized): Secondary | ICD-10-CM | POA: Diagnosis not present

## 2020-10-25 DIAGNOSIS — M79605 Pain in left leg: Secondary | ICD-10-CM | POA: Diagnosis not present

## 2020-11-07 DIAGNOSIS — E569 Vitamin deficiency, unspecified: Secondary | ICD-10-CM | POA: Diagnosis not present

## 2020-11-09 DIAGNOSIS — M1711 Unilateral primary osteoarthritis, right knee: Secondary | ICD-10-CM | POA: Diagnosis not present

## 2020-11-13 DIAGNOSIS — M6281 Muscle weakness (generalized): Secondary | ICD-10-CM | POA: Diagnosis not present

## 2020-11-13 DIAGNOSIS — S72002D Fracture of unspecified part of neck of left femur, subsequent encounter for closed fracture with routine healing: Secondary | ICD-10-CM | POA: Diagnosis not present

## 2020-11-22 DIAGNOSIS — D6869 Other thrombophilia: Secondary | ICD-10-CM | POA: Diagnosis not present

## 2020-11-22 DIAGNOSIS — I10 Essential (primary) hypertension: Secondary | ICD-10-CM | POA: Diagnosis not present

## 2020-11-22 DIAGNOSIS — I5020 Unspecified systolic (congestive) heart failure: Secondary | ICD-10-CM | POA: Diagnosis not present

## 2020-11-22 DIAGNOSIS — I480 Paroxysmal atrial fibrillation: Secondary | ICD-10-CM | POA: Diagnosis not present

## 2020-11-22 DIAGNOSIS — E0590 Thyrotoxicosis, unspecified without thyrotoxic crisis or storm: Secondary | ICD-10-CM | POA: Diagnosis not present

## 2020-11-22 DIAGNOSIS — Z79899 Other long term (current) drug therapy: Secondary | ICD-10-CM | POA: Diagnosis not present

## 2020-11-22 DIAGNOSIS — E7800 Pure hypercholesterolemia, unspecified: Secondary | ICD-10-CM | POA: Diagnosis not present

## 2020-12-02 ENCOUNTER — Encounter (HOSPITAL_BASED_OUTPATIENT_CLINIC_OR_DEPARTMENT_OTHER): Payer: Self-pay

## 2020-12-03 DIAGNOSIS — R52 Pain, unspecified: Secondary | ICD-10-CM | POA: Diagnosis not present

## 2020-12-03 DIAGNOSIS — R600 Localized edema: Secondary | ICD-10-CM | POA: Diagnosis not present

## 2020-12-03 DIAGNOSIS — L0390 Cellulitis, unspecified: Secondary | ICD-10-CM | POA: Diagnosis not present

## 2020-12-03 DIAGNOSIS — R2243 Localized swelling, mass and lump, lower limb, bilateral: Secondary | ICD-10-CM | POA: Diagnosis not present

## 2020-12-07 DIAGNOSIS — L0390 Cellulitis, unspecified: Secondary | ICD-10-CM | POA: Diagnosis not present

## 2020-12-07 DIAGNOSIS — R600 Localized edema: Secondary | ICD-10-CM | POA: Diagnosis not present

## 2020-12-07 DIAGNOSIS — R2243 Localized swelling, mass and lump, lower limb, bilateral: Secondary | ICD-10-CM | POA: Diagnosis not present

## 2020-12-07 NOTE — Telephone Encounter (Signed)
Contacted pt. Appointment scheduled for 11/15.  Buford Dresser, MD  You 4 days ago   Can we either bring her in with me in Nov sometime or [place] sooner? Thanks!

## 2020-12-14 DIAGNOSIS — M170 Bilateral primary osteoarthritis of knee: Secondary | ICD-10-CM | POA: Diagnosis not present

## 2020-12-17 DIAGNOSIS — R52 Pain, unspecified: Secondary | ICD-10-CM | POA: Diagnosis not present

## 2020-12-17 DIAGNOSIS — K649 Unspecified hemorrhoids: Secondary | ICD-10-CM | POA: Diagnosis not present

## 2020-12-17 DIAGNOSIS — L89151 Pressure ulcer of sacral region, stage 1: Secondary | ICD-10-CM | POA: Diagnosis not present

## 2020-12-21 DIAGNOSIS — K567 Ileus, unspecified: Secondary | ICD-10-CM | POA: Diagnosis not present

## 2020-12-24 DIAGNOSIS — Z7689 Persons encountering health services in other specified circumstances: Secondary | ICD-10-CM | POA: Diagnosis not present

## 2020-12-24 DIAGNOSIS — R194 Change in bowel habit: Secondary | ICD-10-CM | POA: Diagnosis not present

## 2020-12-24 DIAGNOSIS — K567 Ileus, unspecified: Secondary | ICD-10-CM | POA: Diagnosis not present

## 2020-12-25 ENCOUNTER — Ambulatory Visit (INDEPENDENT_AMBULATORY_CARE_PROVIDER_SITE_OTHER): Payer: Medicare HMO | Admitting: Cardiology

## 2020-12-25 ENCOUNTER — Other Ambulatory Visit: Payer: Self-pay

## 2020-12-25 ENCOUNTER — Encounter (HOSPITAL_BASED_OUTPATIENT_CLINIC_OR_DEPARTMENT_OTHER): Payer: Self-pay | Admitting: Cardiology

## 2020-12-25 VITALS — BP 120/62 | HR 77 | Ht 70.0 in

## 2020-12-25 DIAGNOSIS — I340 Nonrheumatic mitral (valve) insufficiency: Secondary | ICD-10-CM

## 2020-12-25 DIAGNOSIS — E782 Mixed hyperlipidemia: Secondary | ICD-10-CM | POA: Diagnosis not present

## 2020-12-25 DIAGNOSIS — Z8679 Personal history of other diseases of the circulatory system: Secondary | ICD-10-CM

## 2020-12-25 DIAGNOSIS — R600 Localized edema: Secondary | ICD-10-CM | POA: Diagnosis not present

## 2020-12-25 DIAGNOSIS — I071 Rheumatic tricuspid insufficiency: Secondary | ICD-10-CM | POA: Diagnosis not present

## 2020-12-25 DIAGNOSIS — I10 Essential (primary) hypertension: Secondary | ICD-10-CM

## 2020-12-25 DIAGNOSIS — I890 Lymphedema, not elsewhere classified: Secondary | ICD-10-CM | POA: Diagnosis not present

## 2020-12-25 DIAGNOSIS — I429 Cardiomyopathy, unspecified: Secondary | ICD-10-CM

## 2020-12-25 NOTE — Progress Notes (Signed)
Cardiology Office Note:    Date:  12/25/2020   ID:  Kim Orr, DOB February 25, 1940, MRN [id number]  PCP:  Lajean Manes, MD  Cardiologist:  Buford Dresser, MD  Referring MD: Lajean Manes, MD   CC: follow up  History of Present Illness:    Kim Orr is a 80 y.o. female with a hx of hyperthyroidism on methimazole, hypertension, lymphedema, hyperlipidemia, atrial flutter diagnosed 04/2020 who is seen for post hospital follow up. Last seen in the hospital 05/06/2020.  Today: She is accompanied by her husband. Overall she is feeling good.   On 08/26/2020 she had a mechanical fall and fractured her left hip. In the hospital she was found to be infected with Covid as well. She was in rehab for 7 weeks. She moves around with the help of her wheelchair, lift-chair, and hospital bed at home.  Since her last visit, she believes her LE edema has been stable, and she produces urine consistently while on torsemide.   Lisinopril was discontinued, and Metoprolol was decreased to 100 mg once daily due to low blood pressures.  She denies any palpitations, chest pain, or shortness of breath. No lightheadedness, headaches, syncope, orthopnea, or PND.   Past Medical History:  Diagnosis Date   Atrial fibrillation, chronic ([place]) 08/26/2020   DNR (do not resuscitate) 08/26/2020   Essential hypertension 07/01/2017   Hemorrhoids 03/16/2017   History of nephrolithiasis 04/14/2016   Hyperlipidemia 07/01/2017   Hyperthyroidism 05/03/2020   Multinodular goiter 06/01/2018   Formatting of this note is different from the original. 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  Risk of malignancy 5-15%     Osteoarthritis 07/01/2017   Sciatica 05/03/2020   Status post cholecystectomy 03/16/2017   Formatting of this note might be different from the original. Robotic cholecystectomy performed 1/30 @ [place] of this note might be different from the  original. Robotic cholecystectomy performed 1/30 @ [facility]    Past Surgical History:  Procedure Laterality Date   CARDIOVERSION N/A 05/08/2020   Procedure: CARDIOVERSION;  Surgeon: Sanda Klein, MD;  Location: [place];  Service: Cardiovascular;  Laterality: N/A;   INTRAMEDULLARY (IM) NAIL INTERTROCHANTERIC Left 08/27/2020   Procedure: INTRAMEDULLARY (IM) NAIL INTERTROCHANTRIC; LEFT;  Surgeon: Marchia Bond, MD;  Location: [place];  Service: Orthopedics;  Laterality: Left;   TEE WITHOUT CARDIOVERSION N/A 05/08/2020   Procedure: TRANSESOPHAGEAL ECHOCARDIOGRAM (TEE);  Surgeon: Sanda Klein, MD;  Location: [facility];  Service: Cardiovascular;  Laterality: N/A;    Current Medications: Current Outpatient Medications on File Prior to Visit  Medication Sig   apixaban (ELIQUIS) 5 MG TABS tablet Take 1 tablet (5 mg total) by mouth 2 (two) times daily.   calcium carbonate (TUMS - DOSED IN MG ELEMENTAL CALCIUM) 500 MG chewable tablet Chew 2 tablets by mouth in the morning.   cyclobenzaprine (FLEXERIL) 10 MG tablet Take 10 mg by mouth 3 (three) times daily as needed for muscle spasms.   diclofenac Sodium (VOLTAREN) 1 % GEL Apply 2 g topically daily as needed (for pain).   gabapentin (NEURONTIN) 300 MG capsule Take 300-600 mg by mouth See admin instructions. Take 300 mg by mouth in the morning and 600 mg at bedtime   Melatonin 10 MG TABS Take 10 mg by mouth at bedtime.   methimazole (TAPAZOLE) 5 MG tablet Take 5 mg by mouth daily.   metoprolol succinate (TOPROL-XL) 100 MG 24 hr tablet Take 1 tablet (100 mg total) by mouth daily.  Take with or immediately following a meal.   Multiple Vitamins-Minerals (CENTRUM SILVER 50+WOMEN) TABS Take 1 tablet by mouth daily.   oxyCODONE (ROXICODONE) 5 MG immediate release tablet Take 1-2 tablets (5-10 mg total) by mouth every 4 (four) hours as needed for severe pain.   potassium chloride SA (KLOR-CON) 20 MEQ tablet Take 2 tablets (40 mEq total) by mouth in the  morning.   raloxifene (EVISTA) 60 MG tablet Take 60 mg by mouth at bedtime.   senna-docusate (SENOKOT-S) 8.6-50 MG tablet Take 2 tablets by mouth See admin instructions. Take 2 tablets by mouth in the evening as needed for constipation   simvastatin (ZOCOR) 20 MG tablet Take 20 mg by mouth at bedtime.   Torsemide 40 MG TABS Take 1 tablet by mouth daily.   No current facility-administered medications on file prior to visit.     Allergies:   Patient has no known allergies.   Social History   Tobacco Use   Smoking status: Never   Smokeless tobacco: Never    Family History: family history includes Breast cancer in her mother; Heart disease in her father.  ROS:   Please see the history of present illness. (+) Fall (+) Left hip fracture (+) Bilateral LE edema All other systems are reviewed and negative.    EKGs/Labs/Other Studies Reviewed:    The following studies were reviewed today:  TEE 05/08/2020:  1. Left ventricular ejection fraction, by estimation, is 35 to 40%. The  left ventricle has moderately decreased function.   2. Right ventricular systolic function is moderately reduced. The right  ventricular size is mildly enlarged. There is normal pulmonary artery  systolic pressure.   3. Left atrial size was severely dilated. No left atrial/left atrial  appendage thrombus was detected. The LAA emptying velocity was 60 cm/s.   4. The pericardial effusion is circumferential.   5. The mitral valve is normal in structure. Mild to moderate mitral valve  regurgitation.   6. Tricuspid valve regurgitation is moderate.   7. The aortic valve is tricuspid. Aortic valve regurgitation is not  visualized. Mild aortic valve sclerosis is present, with no evidence of  aortic valve stenosis.   8. There is mild (Grade II) atheroma plaque.   Comparison(s): Compared with 05/04/2020, the left ventricular systolic  function has worsened (heart rate on today's study is 145 bpm, as opposed  to  73 bpm on the 3/25 study).   Echo 05/04/20  1. Left ventricular ejection fraction, by estimation, is 55 to 60%. The  left ventricle has normal function. The left ventricle has no regional  wall motion abnormalities. Left ventricular diastolic function could not  be evaluated.   2. Right ventricular systolic function is mildly reduced. The right  ventricular size is moderately enlarged. There is normal pulmonary artery  systolic pressure.   3. The mitral valve is normal in structure. Moderate mitral valve  regurgitation. No evidence of mitral stenosis.   4. Tricuspid valve regurgitation is moderate.   5. The aortic valve is tricuspid. Aortic valve regurgitation is not  visualized. Mild aortic valve sclerosis is present, with no evidence of  aortic valve stenosis.   6. The inferior vena cava is dilated in size with <50% respiratory  variability, suggesting right atrial pressure of 15 mmHg.   EKG:  Personally reviewed 12/25/2020: nsr, iRBBB at 77 bpm 06/18/2020: Normal sinus rhythm, iRBBB at 66 bpm  Recent Labs: 05/03/2020: TSH 4.920 08/26/2020: B Natriuretic Peptide 91.3 08/31/2020: ALT 15; BUN 15;  Creatinine, Ser 0.60; Hemoglobin 8.3; Magnesium 2.2; Platelets 209; Potassium 3.7; Sodium 138   Recent Lipid Panel No results found for: CHOL, TRIG, HDL, CHOLHDL, VLDL, LDLCALC, LDLDIRECT  Physical Exam:    VS:  BP 120/62 (BP Location: Left Arm, Patient Position: Sitting, Cuff Size: Large)   Pulse 77   Ht 5\' 10"  (1.778 m)   BMI 38.62 kg/m     Wt Readings from Last 3 Encounters:  08/26/20 269 lb 2.9 oz (122.1 kg)  06/18/20 269 lb 1.6 oz (122.1 kg)  05/11/20 269 lb 10 oz (122.3 kg)    GEN: Well nourished, well developed in no acute distress HEENT: Normal, moist mucous membranes NECK: No JVD CARDIAC: regular rhythm, normal S1 and S2, no rubs or gallops. 1/6 systolic murmur. VASCULAR: Radial and DP pulses 2+ bilaterally. No carotid bruits RESPIRATORY:  Clear to auscultation without  rales, wheezing or rhonchi  ABDOMEN: Soft, non-tender, non-distended MUSCULOSKELETAL:  moves all 4 limbs independently, in wheelchair SKIN: Warm and dry, chronic bilateral nonpitting edema (brawny) with chronic skin changes NEUROLOGIC:  Alert and oriented x 3. No focal neuro deficits noted. PSYCHIATRIC:  Normal affect    ASSESSMENT:    1. History of atrial flutter   2. Essential hypertension   3. Mixed hyperlipidemia   4. Bilateral leg edema   5. Moderate mitral regurgitation   6. Moderate tricuspid regurgitation   7. Lymphedema of both lower extremities   8. Cardiomyopathy, unspecified type [facility])     PLAN:    Essential hypertension -has been running lower, had to drop lisinopril and cut metoprolol dose -stable today, continue current medications   Moderate mitral regurgitation  Moderate tricuspid regurgitation  Cardiomyopathy  -reduced EF on TEE but normal on TTE -appears euvolemic, soft murmur -had hypotension on higher dose of metoprolol and lisinopril -continue current dose of metoprolol, no room for additional medications -continue torsemide   Lymphedema of both lower extremities -nonpitting, stable -continue current management   Mixed hyperlipidemia Per [facility] 11/22/20, Tchol 149, HDL 44, LDL 82, TG 132 -continue simvastatin   Atrial flutter Seen during hospitalization 04/2020.  -In sinus rhythm today.  -Continue anticoagulation, CHA2DS2/VAS Stroke Risk Points=4  -no bleeding on apixaban  Cardiac risk counseling and prevention recommendations: -recommend heart healthy/Mediterranean diet, with whole grains, fruits, vegetable, fish, lean meats, nuts, and olive oil. Limit salt. -recommend moderate walking, 3-5 times/week for 30-50 minutes each session. Aim for at least 150 minutes.week. Goal should be pace of 3 miles/hours, or walking 1.5 miles in 30 minutes -recommend avoidance of tobacco products. Avoid excess alcohol. -ASCVD risk score: The ASCVD Risk score  (Arnett DK, et al., 2019) failed to calculate for the following reasons:   The 2019 ASCVD risk score is only valid for ages 28 to 57    Plan for follow up: 1 year with me or sooner as needed.  Buford Dresser, MD, PhD, [facility]    Medication Adjustments/Labs and Tests Ordered: Current medicines are reviewed at length with the patient today.  Concerns regarding medicines are outlined above.   Orders Placed This Encounter  Procedures   EKG 12-Lead    No orders of the defined types were placed in this encounter.  Patient Instructions  Medication Instructions:  Your Physician recommend you continue on your current medication as directed.    *If you need a refill on your cardiac medications before your next appointment, please call your pharmacy*   Lab Work: None ordered today  Testing/Procedures: None ordered today   Follow-Up: At [facility], you and your health needs are our priority.  As part of our continuing mission to provide you with exceptional heart care, we have created designated Provider Care Teams.  These Care Teams include your primary Cardiologist (physician) and Advanced Practice Providers (APPs -  Physician Assistants and Nurse Practitioners) who all work together to provide you with the care you need, when you need it.  We recommend signing up for the patient portal called "MyChart".  Sign up information is provided on this After Visit Summary.  MyChart is used to connect with patients for Virtual Visits (Telemedicine).  Patients are able to view lab/test results, encounter notes, upcoming appointments, etc.  Non-urgent messages can be sent to your provider as well.   To learn more about what you can do with MyChart, go to [email or web address].    Your next appointment:   1 year(s)  The format for your next appointment:   In Person  Provider:   Buford Dresser, MD      Buford Dresser, MD, PhD,  [facility]   I,Mathew Stumpf,acting as a scribe for Buford Dresser, MD.,have documented all relevant documentation on the behalf of Buford Dresser, MD,as directed by  Buford Dresser, MD while in the presence of Buford Dresser, MD.  I, Buford Dresser, MD, have reviewed all documentation for this visit. The documentation on 12/25/20 for the exam, diagnosis, procedures, and orders are all accurate and complete.  Signed, Buford Dresser, MD PhD 12/25/2020 12:14 PM    [place]

## 2020-12-25 NOTE — Patient Instructions (Signed)

## 2021-01-14 DIAGNOSIS — Z961 Presence of intraocular lens: Secondary | ICD-10-CM | POA: Diagnosis not present

## 2021-01-24 DIAGNOSIS — I10 Essential (primary) hypertension: Secondary | ICD-10-CM | POA: Diagnosis not present

## 2021-03-15 DIAGNOSIS — M19011 Primary osteoarthritis, right shoulder: Secondary | ICD-10-CM | POA: Diagnosis not present

## 2021-03-15 DIAGNOSIS — M1711 Unilateral primary osteoarthritis, right knee: Secondary | ICD-10-CM | POA: Diagnosis not present

## 2021-06-03 DIAGNOSIS — D6869 Other thrombophilia: Secondary | ICD-10-CM | POA: Diagnosis not present

## 2021-06-03 DIAGNOSIS — I480 Paroxysmal atrial fibrillation: Secondary | ICD-10-CM | POA: Diagnosis not present

## 2021-06-03 DIAGNOSIS — I10 Essential (primary) hypertension: Secondary | ICD-10-CM | POA: Diagnosis not present

## 2021-06-21 DIAGNOSIS — M1711 Unilateral primary osteoarthritis, right knee: Secondary | ICD-10-CM | POA: Diagnosis not present

## 2021-06-21 DIAGNOSIS — M19011 Primary osteoarthritis, right shoulder: Secondary | ICD-10-CM | POA: Diagnosis not present

## 2021-06-26 ENCOUNTER — Telehealth: Payer: Self-pay | Admitting: Cardiology

## 2021-06-26 NOTE — Telephone Encounter (Signed)
No chest pain since  ? ?Was just sitting in chair, none since then  ? ?Scheduled patient appointment with Dr Harrell Gave for 5/19, reviewed ED precautions  ?Patient stated she just hits a button at [facility] and someone comes to assist ? ?Spoke with Sharyn Lull and she will try to arrange transportation. She will call back if unable to get patient here for appointment  ?

## 2021-06-26 NOTE — Telephone Encounter (Signed)
Pt c/o of Chest Pain: STAT if CP now or developed within 24 hours ? ?1. Are you having CP right now? no ? ?2. Are you experiencing any other symptoms (ex. SOB, nausea, vomiting, sweating)? no ? ?3. How long have you been experiencing CP? Today, but not currently with the patient ? ?4. Is your CP continuous or coming and going? Came and went ? ?5. Have you taken Nitroglycerin? No, never had chest pain before. ??  ? ?Merry Lofty with [place] states the patient had chest pain today around 11:45 am. She says it lasted for 10 minutes and has not had it since. She says the patient has never had chest pain before. She states the patient's BP was 1307/76 HR 77 and then 126/78 a few minutes later. She says the patient has a history of afib, but she does not believe the patient is in afib. She states the patient is wheelchair bound and she told the patient to call her directly if she has chest pain again. She would like the call back to come to her at: [phone] ? ?

## 2021-06-28 ENCOUNTER — Ambulatory Visit (HOSPITAL_BASED_OUTPATIENT_CLINIC_OR_DEPARTMENT_OTHER): Payer: Medicare HMO | Admitting: Cardiology

## 2021-06-28 ENCOUNTER — Encounter (HOSPITAL_BASED_OUTPATIENT_CLINIC_OR_DEPARTMENT_OTHER): Payer: Self-pay | Admitting: Cardiology

## 2021-06-28 VITALS — BP 133/66 | HR 69 | Ht 70.0 in | Wt 269.0 lb

## 2021-06-28 DIAGNOSIS — I340 Nonrheumatic mitral (valve) insufficiency: Secondary | ICD-10-CM | POA: Diagnosis not present

## 2021-06-28 DIAGNOSIS — I890 Lymphedema, not elsewhere classified: Secondary | ICD-10-CM

## 2021-06-28 DIAGNOSIS — R079 Chest pain, unspecified: Secondary | ICD-10-CM

## 2021-06-28 DIAGNOSIS — R072 Precordial pain: Secondary | ICD-10-CM

## 2021-06-28 DIAGNOSIS — E782 Mixed hyperlipidemia: Secondary | ICD-10-CM

## 2021-06-28 DIAGNOSIS — Z8679 Personal history of other diseases of the circulatory system: Secondary | ICD-10-CM | POA: Diagnosis not present

## 2021-06-28 DIAGNOSIS — I071 Rheumatic tricuspid insufficiency: Secondary | ICD-10-CM

## 2021-06-28 MED ORDER — NITROGLYCERIN 0.4 MG SL SUBL: 0.4000 mg | SUBLINGUAL_TABLET | SUBLINGUAL | 3 refills | Status: AC | PRN

## 2021-06-28 NOTE — Patient Instructions (Addendum)
Medication Instructions:   For as needed Nitroglycerin, if you develop chest pain: Sit and rest 5 minutes. If chest pain does not resolve place 1 nitroglycerin under your tongue and wait 5 minutes. If chest pain does not resolve, place a 2nd nitroglycerin under your tongue and wait 5 more minutes. If chest pain does not resolve, place a 3rd nitroglycerin under your tongue and seek emergency services.    *If you need a refill on your cardiac medications before your next appointment, please call your pharmacy*   Lab Work: None ordered today   Testing/Procedures: Cardiac PET Scan [facility]    Follow-Up: At [facility], you and your health needs are our priority.  As part of our continuing mission to provide you with exceptional heart care, we have created designated Provider Care Teams.  These Care Teams include your primary Cardiologist (physician) and Advanced Practice Providers (APPs -  Physician Assistants and Nurse Practitioners) who all work together to provide you with the care you need, when you need it.  We recommend signing up for the patient portal called "MyChart".  Sign up information is provided on this After Visit Summary.  MyChart is used to connect with patients for Virtual Visits (Telemedicine).  Patients are able to view lab/test results, encounter notes, upcoming appointments, etc.  Non-urgent messages can be sent to your provider as well.   To learn more about what you can do with MyChart, go to [email or web address].    Your next appointment:   3 month(s)  The format for your next appointment:   In Person  Provider:   Buford Dresser, MD{  How to Prepare for Your Cardiac PET/CT Stress Test:  1. Please do not take these medications before your test:   Medications that may interfere with the cardiac pharmacological stress agent (ex. nitrates or beta-blockers) the day of the exam. (Metoprolol) Theophylline containing medications for 12  hours. Dipyridamole 48 hours prior to the test. Your remaining medications may be taken with water.  2. Nothing to eat or drink, except water, 3 hours prior to arrival time.   NO caffeine/decaffeinated products, or chocolate 12 hours prior to arrival.  3. NO perfume, cologne or lotion  4. Total time is 1 to 2 hours; you may want to bring reading material for the waiting time.  5. Please report to Admitting at the [facility] Main Entrance 60 minutes early for your test.  [place], [place] [address]  Diabetic Preparation:  Hold oral medications. You may take NPH and Lantus insulin. Do not take Humalog or Humulin R (Regular Insulin) the day of your test. Check blood sugars prior to leaving the house. If able to eat breakfast prior to 3 hour fasting, you may take all medications, including your insulin, Do not worry if you miss your breakfast dose of insulin - start at your next meal.  IF YOU THINK YOU MAY BE PREGNANT, OR ARE NURSING PLEASE INFORM THE TECHNOLOGIST.  In preparation for your appointment, medication and supplies will be purchased.  Appointment availability is limited, so if you need to cancel or reschedule, please call the Radiology Department at [phone]  24 hours in advance to avoid a cancellation fee of $100.00  What to Expect After you Arrive:  Once you arrive and check in for your appointment, you will be taken to a preparation room within the Radiology Department.  A technologist or Nurse will obtain your medical history, verify that you are correctly  prepped for the exam, and explain the procedure.  Afterwards,  an IV will be started in your arm and electrodes will be placed on your skin for EKG monitoring during the stress portion of the exam. Then you will be escorted to the PET/CT scanner.  There, staff will get you positioned on the scanner and obtain a blood pressure and EKG.  During the exam, you will continue to be connected to the  EKG and blood pressure machines.  A small, safe amount of a radioactive tracer will be injected in your IV to obtain a series of pictures of your heart along with an injection of a stress agent.    After your Exam:  It is recommended that you eat a meal and drink a caffeinated beverage to counter act any effects of the stress agent.  Drink plenty of fluids for the remainder of the day and urinate frequently for the first couple of hours after the exam.  Your doctor will inform you of your test results within 7-10 business days.  For questions about your test or how to prepare for your test, please call: Marchia Bond, Cardiac Imaging Nurse Navigator  Gordy Clement, Cardiac Imaging Nurse Navigator Office: [phone]

## 2021-06-28 NOTE — Progress Notes (Signed)
Cardiology Office Note:    Date:  06/28/2021   ID:  Kim Orr, DOB August 27, 1940, MRN [id number]  PCP:  Kim Manes, MD  Cardiologist:  Kim Dresser, MD  Referring MD: Kim Manes, MD   CC: follow up  History of Present Illness:    Kim Orr is a 81 y.o. female with a hx of hyperthyroidism on methimazole, hypertension, lymphedema, hyperlipidemia, atrial flutter diagnosed 04/2020 who is seen for post hospital follow up. Last seen in the hospital 05/06/2020.  On 08/26/2020 she had a mechanical fall and fractured her left hip. In the hospital she was found to be infected with Covid as well. She was in rehab for 7 weeks. She moved around with the help of her wheelchair, lift-chair, and hospital bed at home.  At her last appointment, she believed her LE edema was stable, and she produced urine consistently while on torsemide. Lisinopril was discontinued, and Metoprolol was decreased to 100 mg once daily due to low blood pressures.  Her caretaker, Kim Orr with [facility], called the office on 06/26/21 regarding sudden chest pain of the patient, prompting today's visit. The pain lasted for 10 minutes, and has not recurred. She did not believe the patient was currently in afib.   Today:    She is accompanied by her husband. She continues to live at the [employer].  Patient experienced severe chest pain two days ago when reclining in an easy chair, as she was holding her tablet. She noted that her pain was severe enough to make her cry. She had not taken any medicine or eaten before the pain began. The pain lasted about 10 minutes before resolving spontaneously, and was very sharp. Since then she has not experienced any chest pain.   Her typical diet may include a bagel and juice after 7:00 AM, and maybe crackers and cheese after 11:00 AM. She had not taken pills recently.  She denies any palpitations, shortness of breath. No lightheadedness,  headaches, syncope, orthopnea, or PND.   Past Medical History:  Diagnosis Date   Atrial fibrillation, chronic ([place]) 08/26/2020   DNR (do not resuscitate) 08/26/2020   Essential hypertension 07/01/2017   Hemorrhoids 03/16/2017   History of nephrolithiasis 04/14/2016   Hyperlipidemia 07/01/2017   Hyperthyroidism 05/03/2020   Multinodular goiter 06/01/2018   Formatting of this note is different from the original. 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  Risk of malignancy 5-15%     Osteoarthritis 07/01/2017   Sciatica 05/03/2020   Status post cholecystectomy 03/16/2017   Formatting of this note might be different from the original. Robotic cholecystectomy performed 1/30 @ [place] of this note might be different from the original. Robotic cholecystectomy performed 1/30 @ [facility]    Past Surgical History:  Procedure Laterality Date   CARDIOVERSION N/A 05/08/2020   Procedure: CARDIOVERSION;  Surgeon: Sanda Klein, MD;  Location: [facility];  Service: Cardiovascular;  Laterality: N/A;   INTRAMEDULLARY (IM) NAIL INTERTROCHANTERIC Left 08/27/2020   Procedure: INTRAMEDULLARY (IM) NAIL INTERTROCHANTRIC; LEFT;  Surgeon: Marchia Bond, MD;  Location: [place];  Service: Orthopedics;  Laterality: Left;   TEE WITHOUT CARDIOVERSION N/A 05/08/2020   Procedure: TRANSESOPHAGEAL ECHOCARDIOGRAM (TEE);  Surgeon: Sanda Klein, MD;  Location: [facility];  Service: Cardiovascular;  Laterality: N/A;    Current Medications: Current Outpatient Medications on File Prior to Visit  Medication Sig   acetaminophen (TYLENOL) 650 MG CR tablet Take 650 mg by mouth every 8 (eight)  hours as needed for pain.   apixaban (ELIQUIS) 5 MG TABS tablet Take 1 tablet (5 mg total) by mouth 2 (two) times daily.   calcium carbonate (TUMS - DOSED IN MG ELEMENTAL CALCIUM) 500 MG chewable tablet Chew 2 tablets by mouth in the morning.   cyclobenzaprine (FLEXERIL) 10 MG tablet Take  10 mg by mouth 3 (three) times daily as needed for muscle spasms.   diclofenac Sodium (VOLTAREN) 1 % GEL Apply 2 g topically daily as needed (for pain).   gabapentin (NEURONTIN) 300 MG capsule Take 300-600 mg by mouth See admin instructions. Take 300 mg by mouth in the morning and 600 mg at bedtime   Melatonin 10 MG TABS Take 10 mg by mouth at bedtime.   methimazole (TAPAZOLE) 5 MG tablet Take 5 mg by mouth daily.   metoprolol succinate (TOPROL-XL) 50 MG 24 hr tablet Take 50 mg by mouth daily.   Multiple Vitamins-Minerals (CENTRUM SILVER 50+WOMEN) TABS Take 1 tablet by mouth daily.   oxyCODONE (ROXICODONE) 5 MG immediate release tablet Take 1-2 tablets (5-10 mg total) by mouth every 4 (four) hours as needed for severe pain.   potassium chloride SA (KLOR-CON) 20 MEQ tablet Take 2 tablets (40 mEq total) by mouth in the morning.   raloxifene (EVISTA) 60 MG tablet Take 60 mg by mouth at bedtime.   senna-docusate (SENOKOT-S) 8.6-50 MG tablet Take 2 tablets by mouth See admin instructions. Take 2 tablets by mouth in the evening as needed for constipation   simvastatin (ZOCOR) 20 MG tablet Take 20 mg by mouth at bedtime.   Torsemide 40 MG TABS Take 1 tablet by mouth daily.   No current facility-administered medications on file prior to visit.     Allergies:   Patient has no known allergies.   Social History   Tobacco Use   Smoking status: Never   Smokeless tobacco: Never    Family History: family history includes Breast cancer in her mother; Heart disease in her father.  ROS:   Please see the history of present illness. (+) Chest Pain (+) Edema All other systems are reviewed and negative.    EKGs/Labs/Other Studies Reviewed:    The following studies were reviewed today:  TEE 05/08/2020:  1. Left ventricular ejection fraction, by estimation, is 35 to 40%. The  left ventricle has moderately decreased function.   2. Right ventricular systolic function is moderately reduced. The right   ventricular size is mildly enlarged. There is normal pulmonary artery  systolic pressure.   3. Left atrial size was severely dilated. No left atrial/left atrial  appendage thrombus was detected. The LAA emptying velocity was 60 cm/s.   4. The pericardial effusion is circumferential.   5. The mitral valve is normal in structure. Mild to moderate mitral valve  regurgitation.   6. Tricuspid valve regurgitation is moderate.   7. The aortic valve is tricuspid. Aortic valve regurgitation is not  visualized. Mild aortic valve sclerosis is present, with no evidence of  aortic valve stenosis.   8. There is mild (Grade II) atheroma plaque.   Comparison(s): Compared with 05/04/2020, the left ventricular systolic  function has worsened (heart rate on today's study is 145 bpm, as opposed  to 73 bpm on the 3/25 study).   Echo 05/04/20  1. Left ventricular ejection fraction, by estimation, is 55 to 60%. The  left ventricle has normal function. The left ventricle has no regional  wall motion abnormalities. Left ventricular diastolic function could not  be evaluated.   2. Right ventricular systolic function is mildly reduced. The right  ventricular size is moderately enlarged. There is normal pulmonary artery  systolic pressure.   3. The mitral valve is normal in structure. Moderate mitral valve  regurgitation. No evidence of mitral stenosis.   4. Tricuspid valve regurgitation is moderate.   5. The aortic valve is tricuspid. Aortic valve regurgitation is not  visualized. Mild aortic valve sclerosis is present, with no evidence of  aortic valve stenosis.   6. The inferior vena cava is dilated in size with <50% respiratory  variability, suggesting right atrial pressure of 15 mmHg.   EKG:  Personally reviewed 06/28/21: NSR at 69 bpm, iRBBB 12/25/2020: nsr, iRBBB at 77 bpm 06/18/2020: Normal sinus rhythm, iRBBB at 66 bpm  Recent Labs: 08/26/2020: B Natriuretic Peptide 91.3 08/31/2020: ALT 15; BUN 15;  Creatinine, Ser 0.60; Hemoglobin 8.3; Magnesium 2.2; Platelets 209; Potassium 3.7; Sodium 138   Recent Lipid Panel No results found for: CHOL, TRIG, HDL, CHOLHDL, VLDL, LDLCALC, LDLDIRECT  Physical Exam:    VS:  BP 133/66 (BP Location: Left Arm, Patient Position: Sitting, Cuff Size: Large)   Pulse 69   Ht '5\' 10"'$  (1.778 m)   Wt 269 lb (122 kg)   BMI 38.60 kg/m     Wt Readings from Last 3 Encounters:  06/28/21 269 lb (122 kg)  08/26/20 269 lb 2.9 oz (122.1 kg)  06/18/20 269 lb 1.6 oz (122.1 kg)    GEN: Well nourished, well developed in no acute distress HEENT: Normal, moist mucous membranes NECK: No JVD CARDIAC: regular rhythm, normal S1 and S2, no rubs or gallops. 1/6 systolic murmur. VASCULAR: Radial and DP pulses 2+ bilaterally. No carotid bruits RESPIRATORY:  Clear to auscultation without rales, wheezing or rhonchi  ABDOMEN: Soft, non-tender, non-distended MUSCULOSKELETAL:  moves all 4 limbs independently, in wheelchair SKIN: Warm and dry, chronic bilateral nonpitting edema (brawny) with chronic skin changes NEUROLOGIC:  Alert and oriented x 3. No focal neuro deficits noted. PSYCHIATRIC:  Normal affect    ASSESSMENT:    1. Chest pain of uncertain etiology   2. Precordial pain   3. Moderate mitral regurgitation   4. Moderate tricuspid regurgitation   5. Lymphedema of both lower extremities   6. History of atrial flutter   7. Mixed hyperlipidemia      PLAN:    Chest pain -discussed potential etiologies of this -discussed treadmill stress, nuclear stress/lexiscan, and CT coronary angiography. Discussed pros and cons of each, including but not limited to false positive/false negative risk, radiation risk, and risk of IV contrast dye. Based on shared decision making, decision was made to cardiac PET. Discussed lexiscan use for test -prescribed PRN SL NG, instructed on use -instructed on red flag warning signs that need immediate medical attention  Essential  hypertension -had to drop lisinopril and cut metoprolol dose in the past due to hypotension -stable today, continue current medications   Moderate mitral regurgitation  Moderate tricuspid regurgitation  Cardiomyopathy  -reduced EF on TEE but normal on TTE -appears euvolemic, soft murmur -had hypotension on higher dose of metoprolol and lisinopril -continue current dose of metoprolol, no room for additional medications -continue torsemide   Lymphedema of both lower extremities -nonpitting, stable -continue current management   Mixed hyperlipidemia -continue simvastatin   Atrial flutter Seen during hospitalization 04/2020.  -In sinus rhythm today.  -Continue anticoagulation, CHA2DS2/VAS Stroke Risk Points=4  -no bleeding on apixaban  Cardiac risk counseling and prevention recommendations: -recommend  heart healthy/Mediterranean diet, with whole grains, fruits, vegetable, fish, lean meats, nuts, and olive oil. Limit salt. -recommend moderate walking, 3-5 times/week for 30-50 minutes each session. Aim for at least 150 minutes.week. Goal should be pace of 3 miles/hours, or walking 1.5 miles in 30 minutes -recommend avoidance of tobacco products. Avoid excess alcohol. -ASCVD risk score: The ASCVD Risk score (Arnett DK, et al., 2019) failed to calculate for the following reasons:   The 2019 ASCVD risk score is only valid for ages 18 to 60    Plan for follow up: Follow up in 3 months, or sooner if needed.   Kim Dresser, MD, PhD, [facility]    Medication Adjustments/Labs and Tests Ordered: Current medicines are reviewed at length with the patient today.  Concerns regarding medicines are outlined above.   Orders Placed This Encounter  Procedures   NM PET CT CARDIAC PERFUSION MULTI W/ABSOLUTE BLOODFLOW   Cardiac Stress Test: Informed Consent Details: Physician/Practitioner Attestation; Transcribe to consent form and obtain patient signature   EKG  12-Lead    Meds ordered this encounter  Medications   nitroGLYCERIN (NITROSTAT) 0.4 MG SL tablet    Sig: Place 1 tablet (0.4 mg total) under the tongue every 5 (five) minutes as needed for chest pain.    Dispense:  25 tablet    Refill:  3    Patient Instructions  Medication Instructions:   For as needed Nitroglycerin, if you develop chest pain: Sit and rest 5 minutes. If chest pain does not resolve place 1 nitroglycerin under your tongue and wait 5 minutes. If chest pain does not resolve, place a 2nd nitroglycerin under your tongue and wait 5 more minutes. If chest pain does not resolve, place a 3rd nitroglycerin under your tongue and seek emergency services.    *If you need a refill on your cardiac medications before your next appointment, please call your pharmacy*   Lab Work: None ordered today   Testing/Procedures: Cardiac PET Scan [facility]    Follow-Up: At [facility], you and your health needs are our priority.  As part of our continuing mission to provide you with exceptional heart care, we have created designated Provider Care Teams.  These Care Teams include your primary Cardiologist (physician) and Advanced Practice Providers (APPs -  Physician Assistants and Nurse Practitioners) who all work together to provide you with the care you need, when you need it.  We recommend signing up for the patient portal called "MyChart".  Sign up information is provided on this After Visit Summary.  MyChart is used to connect with patients for Virtual Visits (Telemedicine).  Patients are able to view lab/test results, encounter notes, upcoming appointments, etc.  Non-urgent messages can be sent to your provider as well.   To learn more about what you can do with MyChart, go to [email or web address].    Your next appointment:   3 month(s)  The format for your next appointment:   In Person  Provider:   Buford Dresser, MD{  How to Prepare for Your Cardiac  PET/CT Stress Test:  1. Please do not take these medications before your test:   Medications that may interfere with the cardiac pharmacological stress agent (ex. nitrates or beta-blockers) the day of the exam. (Metoprolol) Theophylline containing medications for 12 hours. Dipyridamole 48 hours prior to the test. Your remaining medications may be taken with water.  2. Nothing to eat or drink, except water, 3 hours prior to arrival time.  NO caffeine/decaffeinated products, or chocolate 12 hours prior to arrival.  3. NO perfume, cologne or lotion  4. Total time is 1 to 2 hours; you may want to bring reading material for the waiting time.  5. Please report to Admitting at the [facility] Main Entrance 60 minutes early for your test.  [place], [place] [address]  Diabetic Preparation:  Hold oral medications. You may take NPH and Lantus insulin. Do not take Humalog or Humulin R (Regular Insulin) the day of your test. Check blood sugars prior to leaving the house. If able to eat breakfast prior to 3 hour fasting, you may take all medications, including your insulin, Do not worry if you miss your breakfast dose of insulin - start at your next meal.  IF YOU THINK YOU MAY BE PREGNANT, OR ARE NURSING PLEASE INFORM THE TECHNOLOGIST.  In preparation for your appointment, medication and supplies will be purchased.  Appointment availability is limited, so if you need to cancel or reschedule, please call the Radiology Department at [phone]  24 hours in advance to avoid a cancellation fee of $100.00  What to Expect After you Arrive:  Once you arrive and check in for your appointment, you will be taken to a preparation room within the Radiology Department.  A technologist or Nurse will obtain your medical history, verify that you are correctly prepped for the exam, and explain the procedure.  Afterwards,  an IV will be started in your arm and electrodes will be  placed on your skin for EKG monitoring during the stress portion of the exam. Then you will be escorted to the PET/CT scanner.  There, staff will get you positioned on the scanner and obtain a blood pressure and EKG.  During the exam, you will continue to be connected to the EKG and blood pressure machines.  A small, safe amount of a radioactive tracer will be injected in your IV to obtain a series of pictures of your heart along with an injection of a stress agent.    After your Exam:  It is recommended that you eat a meal and drink a caffeinated beverage to counter act any effects of the stress agent.  Drink plenty of fluids for the remainder of the day and urinate frequently for the first couple of hours after the exam.  Your doctor will inform you of your test results within 7-10 business days.  For questions about your test or how to prepare for your test, please call: Marchia Bond, Cardiac Imaging Nurse Navigator  Gordy Clement, Cardiac Imaging Nurse Navigator Office: [phone]            Kim Dresser, MD, PhD, [place]   I,Mathew Stumpf,acting as a scribe for Kim Dresser, MD.,have documented all relevant documentation on the behalf of Kim Dresser, MD,as directed by  Kim Dresser, MD while in the presence of Kim Dresser, MD.  I, Kim Dresser, MD, have reviewed all documentation for this visit. The documentation on 06/28/21 for the exam, diagnosis, procedures, and orders are all accurate and complete.  Signed, Kim Dresser, MD PhD 06/28/2021 5:13 PM    [facility]

## 2021-08-12 DIAGNOSIS — E7800 Pure hypercholesterolemia, unspecified: Secondary | ICD-10-CM | POA: Diagnosis not present

## 2021-08-12 DIAGNOSIS — E0590 Thyrotoxicosis, unspecified without thyrotoxic crisis or storm: Secondary | ICD-10-CM | POA: Diagnosis not present

## 2021-08-12 DIAGNOSIS — I340 Nonrheumatic mitral (valve) insufficiency: Secondary | ICD-10-CM | POA: Diagnosis not present

## 2021-08-12 DIAGNOSIS — D6869 Other thrombophilia: Secondary | ICD-10-CM | POA: Diagnosis not present

## 2021-08-12 DIAGNOSIS — I890 Lymphedema, not elsewhere classified: Secondary | ICD-10-CM | POA: Diagnosis not present

## 2021-08-12 DIAGNOSIS — M810 Age-related osteoporosis without current pathological fracture: Secondary | ICD-10-CM | POA: Diagnosis not present

## 2021-08-12 DIAGNOSIS — Z79899 Other long term (current) drug therapy: Secondary | ICD-10-CM | POA: Diagnosis not present

## 2021-08-12 DIAGNOSIS — Z0000 Encounter for general adult medical examination without abnormal findings: Secondary | ICD-10-CM | POA: Diagnosis not present

## 2021-08-12 DIAGNOSIS — I10 Essential (primary) hypertension: Secondary | ICD-10-CM | POA: Diagnosis not present

## 2021-08-12 DIAGNOSIS — G8929 Other chronic pain: Secondary | ICD-10-CM | POA: Diagnosis not present

## 2021-08-12 DIAGNOSIS — I480 Paroxysmal atrial fibrillation: Secondary | ICD-10-CM | POA: Diagnosis not present

## 2021-09-13 ENCOUNTER — Telehealth (HOSPITAL_COMMUNITY): Payer: Self-pay | Admitting: Emergency Medicine

## 2021-09-13 DIAGNOSIS — M19011 Primary osteoarthritis, right shoulder: Secondary | ICD-10-CM | POA: Diagnosis not present

## 2021-09-13 DIAGNOSIS — M170 Bilateral primary osteoarthritis of knee: Secondary | ICD-10-CM | POA: Diagnosis not present

## 2021-09-13 DIAGNOSIS — M1711 Unilateral primary osteoarthritis, right knee: Secondary | ICD-10-CM | POA: Diagnosis not present

## 2021-09-13 NOTE — Telephone Encounter (Signed)
Reaching out to patient to offer assistance regarding upcoming cardiac imaging study; pt verbalizes understanding of appt date/time, parking situation and where to check in, pre-test NPO status and medications ordered, and verified current allergies; name and call back number provided for further questions should they arise Marchia Bond RN [facility] [phone] office [phone] cell  Arrival 1030 [address] main entrance Does not walk, immobile

## 2021-09-17 ENCOUNTER — Encounter (HOSPITAL_COMMUNITY)
Admission: RE | Admit: 2021-09-17 | Discharge: 2021-09-17 | Disposition: A | Discharge status: Home / Self Care | Payer: Medicare HMO | Source: Ambulatory Visit | Attending: Cardiology | Admitting: Cardiology

## 2021-09-17 DIAGNOSIS — R079 Chest pain, unspecified: Secondary | ICD-10-CM | POA: Insufficient documentation

## 2021-09-17 DIAGNOSIS — R072 Precordial pain: Secondary | ICD-10-CM | POA: Diagnosis not present

## 2021-09-17 MED ORDER — REGADENOSON 0.4 MG/5ML IV SOLN: 0.4000 mg | Freq: Once | INTRAVENOUS | Status: AC

## 2021-09-17 MED ORDER — RUBIDIUM RB82 GENERATOR (RUBYFILL)
29.8000 | PACK | Freq: Once | INTRAVENOUS | Status: AC
  Administered 2021-09-17: 29.8 via INTRAVENOUS

## 2021-09-17 MED ORDER — REGADENOSON 0.4 MG/5ML IV SOLN
INTRAVENOUS | Status: AC
  Administered 2021-09-17: 0.4 mg via INTRAVENOUS
  Filled 2021-09-17: qty 5

## 2021-09-17 MED ORDER — RUBIDIUM RB82 GENERATOR (RUBYFILL)
29.9000 | PACK | Freq: Once | INTRAVENOUS | Status: AC
  Administered 2021-09-17: 29.9 via INTRAVENOUS

## 2021-09-19 LAB — NM PET CT CARDIAC PERFUSION MULTI W/ABSOLUTE BLOODFLOW
MBFR: 1.84
Nuc Rest EF: 73 %
Nuc Stress EF: 79 %
Rest MBF: 1.36 ml/g/min
Rest Nuclear Isotope Dose: 30 mCi
ST Depression (mm): 0 mm
Stress MBF: 2.5 ml/g/min
Stress Nuclear Isotope Dose: 30 mCi
TID: 1.11

## 2021-10-01 ENCOUNTER — Encounter (HOSPITAL_BASED_OUTPATIENT_CLINIC_OR_DEPARTMENT_OTHER): Payer: Self-pay | Admitting: Cardiology

## 2021-10-01 ENCOUNTER — Ambulatory Visit (HOSPITAL_BASED_OUTPATIENT_CLINIC_OR_DEPARTMENT_OTHER): Payer: Medicare HMO | Admitting: Cardiology

## 2021-10-01 VITALS — BP 130/78 | HR 77 | Ht 70.0 in

## 2021-10-01 DIAGNOSIS — Z8679 Personal history of other diseases of the circulatory system: Secondary | ICD-10-CM

## 2021-10-01 DIAGNOSIS — I340 Nonrheumatic mitral (valve) insufficiency: Secondary | ICD-10-CM | POA: Diagnosis not present

## 2021-10-01 DIAGNOSIS — I890 Lymphedema, not elsewhere classified: Secondary | ICD-10-CM

## 2021-10-01 DIAGNOSIS — Z712 Person consulting for explanation of examination or test findings: Secondary | ICD-10-CM

## 2021-10-01 DIAGNOSIS — E782 Mixed hyperlipidemia: Secondary | ICD-10-CM | POA: Diagnosis not present

## 2021-10-01 DIAGNOSIS — I071 Rheumatic tricuspid insufficiency: Secondary | ICD-10-CM

## 2021-10-01 DIAGNOSIS — I10 Essential (primary) hypertension: Secondary | ICD-10-CM | POA: Diagnosis not present

## 2021-10-01 NOTE — Patient Instructions (Signed)

## 2021-10-01 NOTE — Progress Notes (Signed)
Cardiology Office Note:    Date:  10/01/2021   ID:  Kim Orr, DOB [birth year], MRN [id number]  PCP:  Lajean Manes, MD  Cardiologist:  Buford Dresser, MD  Referring MD: Lajean Manes, MD   CC: follow up  History of Present Illness:    Kim Orr is a 81 y.o. female with a hx of hyperthyroidism on methimazole, hypertension, lymphedema, hyperlipidemia, atrial flutter diagnosed 04/2020 who is seen for follow up. She had been seen in the hospital 05/06/2020.  On 08/26/2020 she had a mechanical fall and fractured her left hip. In the hospital she was found to be infected with Covid as well. She was in rehab for 7 weeks. She moved around with the help of her wheelchair, lift-chair, and hospital bed at home.  At her appointment on 12/25/2021. Lisinopril was discontinued, and Metoprolol was decreased to 100 mg once daily due to low blood pressures.  At her last appointment she reported an isolated episode of severe chest pain that resolved spontaneously. After shared decision making a cardiac PET was ordered. This showed normal LV perfusion and no evidence of ischemia or infarction.  Today:   She is accompanied by her husband. She continues to live at the [employer] [facility].  She is feeling okay with no recurring chest pain. However, she continues to suffer from chronic back pain. When she first wakes up in the morning, she has difficulty with getting up from the hospital bed.   Lately her breathing has been stable. Her husband notes that she is dyspneic while transitioning from her chair to her bed.  She confirms having a healthy appetite.  Recently she had a blister on the sole of her left foot which caused more difficulty with walking.  Typically she drinks prune juice every day, with Miralax mixed in every 3 days.  She denies any palpitations, or peripheral edema. No lightheadedness, headaches, syncope, orthopnea, or PND.    Past Medical History:   Diagnosis Date   Atrial fibrillation, chronic ([place]) 08/26/2020   DNR (do not resuscitate) 08/26/2020   Essential hypertension 07/01/2017   Hemorrhoids 03/16/2017   History of nephrolithiasis 04/14/2016   Hyperlipidemia 07/01/2017   Hyperthyroidism 05/03/2020   Multinodular goiter 06/01/2018   Formatting of this note is different from the original. 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  Risk of malignancy 5-15%     Osteoarthritis 07/01/2017   Sciatica 05/03/2020   Status post cholecystectomy 03/16/2017   Formatting of this note might be different from the original. Robotic cholecystectomy performed 1/30 @ [place] of this note might be different from the original. Robotic cholecystectomy performed 1/30 @ [facility]    Past Surgical History:  Procedure Laterality Date   CARDIOVERSION N/A 05/08/2020   Procedure: CARDIOVERSION;  Surgeon: Sanda Klein, MD;  Location: [place];  Service: Cardiovascular;  Laterality: N/A;   INTRAMEDULLARY (IM) NAIL INTERTROCHANTERIC Left 08/27/2020   Procedure: INTRAMEDULLARY (IM) NAIL INTERTROCHANTRIC; LEFT;  Surgeon: Marchia Bond, MD;  Location: [place];  Service: Orthopedics;  Laterality: Left;   TEE WITHOUT CARDIOVERSION N/A 05/08/2020   Procedure: TRANSESOPHAGEAL ECHOCARDIOGRAM (TEE);  Surgeon: Sanda Klein, MD;  Location: [facility];  Service: Cardiovascular;  Laterality: N/A;    Current Medications: Current Outpatient Medications on File Prior to Visit  Medication Sig   acetaminophen (TYLENOL) 650 MG CR tablet Take 650 mg by mouth every 8 (eight) hours as needed for pain.   apixaban (ELIQUIS) 5 MG TABS tablet  Take 1 tablet (5 mg total) by mouth 2 (two) times daily.   calcium carbonate (TUMS - DOSED IN MG ELEMENTAL CALCIUM) 500 MG chewable tablet Chew 2 tablets by mouth in the morning.   cyclobenzaprine (FLEXERIL) 10 MG tablet Take 10 mg by mouth daily at 12 noon.   diclofenac Sodium (VOLTAREN) 1  % GEL Apply 2 g topically daily as needed (for pain).   gabapentin (NEURONTIN) 300 MG capsule Take 300-600 mg by mouth See admin instructions. Take 300 mg by mouth in the morning and 600 mg at bedtime   Melatonin 10 MG TABS Take 10 mg by mouth at bedtime.   methimazole (TAPAZOLE) 5 MG tablet Take 5 mg by mouth daily.   metoprolol succinate (TOPROL-XL) 50 MG 24 hr tablet Take 50 mg by mouth daily.   Multiple Vitamins-Minerals (CENTRUM SILVER 50+WOMEN) TABS Take 1 tablet by mouth daily.   oxyCODONE (ROXICODONE) 5 MG immediate release tablet Take 1-2 tablets (5-10 mg total) by mouth every 4 (four) hours as needed for severe pain.   potassium chloride SA (KLOR-CON M) 20 MEQ tablet Take 20 mEq by mouth daily.   senna-docusate (SENOKOT-S) 8.6-50 MG tablet Take 2 tablets by mouth See admin instructions. Take 2 tablets by mouth in the evening as needed for constipation   simvastatin (ZOCOR) 20 MG tablet Take 20 mg by mouth at bedtime.   torsemide (DEMADEX) 20 MG tablet Take 1 tablet by mouth daily.   nitroGLYCERIN (NITROSTAT) 0.4 MG SL tablet Place 1 tablet (0.4 mg total) under the tongue every 5 (five) minutes as needed for chest pain.   No current facility-administered medications on file prior to visit.     Allergies:   Patient has no known allergies.   Social History   Tobacco Use   Smoking status: Never   Smokeless tobacco: Never    Family History: family history includes Breast cancer in her mother; Heart disease in her father.  ROS:   Please see the history of present illness. (+) Chronic back pain All other systems are reviewed and negative.    EKGs/Labs/Other Studies Reviewed:    The following studies were reviewed today:  PET Cardiac CT 09/17/2021:   LV perfusion is normal. There is no evidence of ischemia. There is no evidence of infarction.   Rest left ventricular function is normal. Rest EF: 73 %. Stress left ventricular function is normal. Stress EF: 79 %. End diastolic  cavity size is normal. End systolic cavity size is normal.   Myocardial blood flow was computed to be 1.5m/g/min at rest and 2.543mg/min at stress. Global myocardial blood flow reserve was 1.84 and was mildly abnormal.  While flow reserve is mildly reduced, this is due to high rest flow; stress flows are normal, suggesting no evidence of decreased perfusion   Coronary calcium was absent on the attenuation correction CT images.   The study is normal. The study is low risk.   CLINICAL DATA:  This over-read does not include interpretation of cardiac or coronary anatomy or pathology. The PET interpretation by the cardiologist is attached.   COMPARISON:  None Available.   FINDINGS: Vascular: Atherosclerotic calcification of the aorta and aortic valve. Pulmonic trunk and heart are enlarged.   Mediastinum/Nodes: None.   Lungs/Pleura: Expiratory phase imaging, creating added density in the lungs. Dependent atelectasis bilaterally. Respiratory motion. No suspicious pulmonary nodules.   Upper Abdomen: None.   Musculoskeletal: Degenerative changes in the spine.   IMPRESSION: 1. No acute extracardiac findings. 2.  Aortic atherosclerosis (ICD10-I70.0). 3. Enlarged pulmonic trunk indicative of pulmonary arterial hypertension.  TEE 05/08/2020:  1. Left ventricular ejection fraction, by estimation, is 35 to 40%. The  left ventricle has moderately decreased function.   2. Right ventricular systolic function is moderately reduced. The right  ventricular size is mildly enlarged. There is normal pulmonary artery  systolic pressure.   3. Left atrial size was severely dilated. No left atrial/left atrial  appendage thrombus was detected. The LAA emptying velocity was 60 cm/s.   4. The pericardial effusion is circumferential.   5. The mitral valve is normal in structure. Mild to moderate mitral valve  regurgitation.   6. Tricuspid valve regurgitation is moderate.   7. The aortic valve is  tricuspid. Aortic valve regurgitation is not  visualized. Mild aortic valve sclerosis is present, with no evidence of  aortic valve stenosis.   8. There is mild (Grade II) atheroma plaque.   Comparison(s): Compared with 05/04/2020, the left ventricular systolic  function has worsened (heart rate on today's study is 145 bpm, as opposed  to 73 bpm on the 3/25 study).   Echo 05/04/20  1. Left ventricular ejection fraction, by estimation, is 55 to 60%. The  left ventricle has normal function. The left ventricle has no regional  wall motion abnormalities. Left ventricular diastolic function could not  be evaluated.   2. Right ventricular systolic function is mildly reduced. The right  ventricular size is moderately enlarged. There is normal pulmonary artery  systolic pressure.   3. The mitral valve is normal in structure. Moderate mitral valve  regurgitation. No evidence of mitral stenosis.   4. Tricuspid valve regurgitation is moderate.   5. The aortic valve is tricuspid. Aortic valve regurgitation is not  visualized. Mild aortic valve sclerosis is present, with no evidence of  aortic valve stenosis.   6. The inferior vena cava is dilated in size with <50% respiratory  variability, suggesting right atrial pressure of 15 mmHg.   EKG:  Personally reviewed 10/01/2021: EKG was not ordered 06/28/21: NSR at 69 bpm, iRBBB 12/25/2020: nsr, iRBBB at 77 bpm 06/18/2020: Normal sinus rhythm, iRBBB at 66 bpm  Recent Labs: No results found for requested labs within last 365 days.   Recent Lipid Panel No results found for: "CHOL", "TRIG", "HDL", "CHOLHDL", "VLDL", "LDLCALC", "LDLDIRECT"  Physical Exam:    VS:  BP 130/78 (BP Location: Right Arm, Patient Position: Sitting, Cuff Size: Large)   Pulse 77   Ht '5\' 10"'$  (1.778 m)   SpO2 91%   BMI 38.60 kg/m     Wt Readings from Last 3 Encounters:  06/28/21 269 lb (122 kg)  08/26/20 269 lb 2.9 oz (122.1 kg)  06/18/20 269 lb 1.6 oz (122.1 kg)    GEN:  Well nourished, well developed in no acute distress HEENT: Normal, moist mucous membranes NECK: No JVD CARDIAC: regular rhythm, normal S1 and S2, no rubs or gallops. 1/6 systolic murmur. VASCULAR: Radial and DP pulses 2+ bilaterally. No carotid bruits RESPIRATORY:  Clear to auscultation without rales, wheezing or rhonchi  ABDOMEN: Soft, non-tender, non-distended MUSCULOSKELETAL:  moves all 4 limbs independently, in wheelchair SKIN: Warm and dry, chronic bilateral nonpitting edema (brawny) with chronic skin changes NEUROLOGIC:  Alert and oriented x 3. No focal neuro deficits noted. PSYCHIATRIC:  Normal affect    ASSESSMENT:    1. Encounter to discuss test results   2. History of atrial flutter   3. Moderate mitral regurgitation   4. Moderate tricuspid  regurgitation   5. Lymphedema of both lower extremities   6. Mixed hyperlipidemia   7. Essential hypertension     PLAN:    Test results: reviewed PET results, no evidence of ischemia. Chest pain has not recurred recently  Essential hypertension -had to drop lisinopril and cut metoprolol dose in the past due to hypotension -stable today, continue current medications   Moderate mitral regurgitation  Moderate tricuspid regurgitation  Cardiomyopathy  -reduced EF on TEE but normal on TTE -appears euvolemic, soft murmur -had hypotension on higher dose of metoprolol and lisinopril -continue current dose of metoprolol, no room for additional medications -continue torsemide   Lymphedema of both lower extremities -nonpitting, stable -continue current management   Mixed hyperlipidemia -continue simvastatin   Atrial flutter Seen during hospitalization 04/2020.  -In sinus rhythm today.  -Continue anticoagulation, CHA2DS2/VAS Stroke Risk Points=4  -no bleeding on apixaban  Plan for follow up: 1 year, or sooner if needed.   Buford Dresser, MD, PhD, [facility]    Medication Adjustments/Labs and Tests  Ordered: Current medicines are reviewed at length with the patient today.  Concerns regarding medicines are outlined above.   No orders of the defined types were placed in this encounter.   No orders of the defined types were placed in this encounter.   Patient Instructions  Medication Instructions:  Your Physician recommend you continue on your current medication as directed.    *If you need a refill on your cardiac medications before your next appointment, please call your pharmacy*   Lab Work: None ordered today   Testing/Procedures: None ordered today   Follow-Up: At [facility], you and your health needs are our priority.  As part of our continuing mission to provide you with exceptional heart care, we have created designated Provider Care Teams.  These Care Teams include your primary Cardiologist (physician) and Advanced Practice Providers (APPs -  Physician Assistants and Nurse Practitioners) who all work together to provide you with the care you need, when you need it.  We recommend signing up for the patient portal called "MyChart".  Sign up information is provided on this After Visit Summary.  MyChart is used to connect with patients for Virtual Visits (Telemedicine).  Patients are able to view lab/test results, encounter notes, upcoming appointments, etc.  Non-urgent messages can be sent to your provider as well.   To learn more about what you can do with MyChart, go to [email or web address].    Your next appointment:   1 year(s)  The format for your next appointment:   In Person  Provider:   Buford Dresser, MD{         Buford Dresser, MD, PhD, [place]   I,Mathew Stumpf,acting as a scribe for Buford Dresser, MD.,have documented all relevant documentation on the behalf of Buford Dresser, MD,as directed by  Buford Dresser, MD while in the presence of Buford Dresser, MD.  I, Madelin Rear, have reviewed all documentation for this visit. The documentation on 10/01/21 for the exam, diagnosis, procedures, and orders are all accurate and complete.  Signed, Buford Dresser, MD PhD 10/01/2021 2:22 PM    [place]

## 2021-11-27 DIAGNOSIS — I890 Lymphedema, not elsewhere classified: Secondary | ICD-10-CM | POA: Diagnosis not present

## 2021-11-27 DIAGNOSIS — I872 Venous insufficiency (chronic) (peripheral): Secondary | ICD-10-CM | POA: Diagnosis not present

## 2021-11-27 DIAGNOSIS — L03116 Cellulitis of left lower limb: Secondary | ICD-10-CM | POA: Diagnosis not present

## 2021-11-27 DIAGNOSIS — R269 Unspecified abnormalities of gait and mobility: Secondary | ICD-10-CM | POA: Diagnosis not present

## 2021-11-27 DIAGNOSIS — L03115 Cellulitis of right lower limb: Secondary | ICD-10-CM | POA: Diagnosis not present

## 2021-12-10 DIAGNOSIS — I890 Lymphedema, not elsewhere classified: Secondary | ICD-10-CM | POA: Diagnosis not present

## 2021-12-10 DIAGNOSIS — I872 Venous insufficiency (chronic) (peripheral): Secondary | ICD-10-CM | POA: Diagnosis not present

## 2021-12-13 DIAGNOSIS — M19011 Primary osteoarthritis, right shoulder: Secondary | ICD-10-CM | POA: Diagnosis not present

## 2021-12-13 DIAGNOSIS — M1711 Unilateral primary osteoarthritis, right knee: Secondary | ICD-10-CM | POA: Diagnosis not present

## 2021-12-24 IMAGING — DX DG CHEST 1V PORT
1 series · 1 of 1 positions shown · non-contrast
Comparison: None.

CLINICAL DATA: Chest pain

EXAM:
PORTABLE CHEST 1 VIEW

[chest ap]
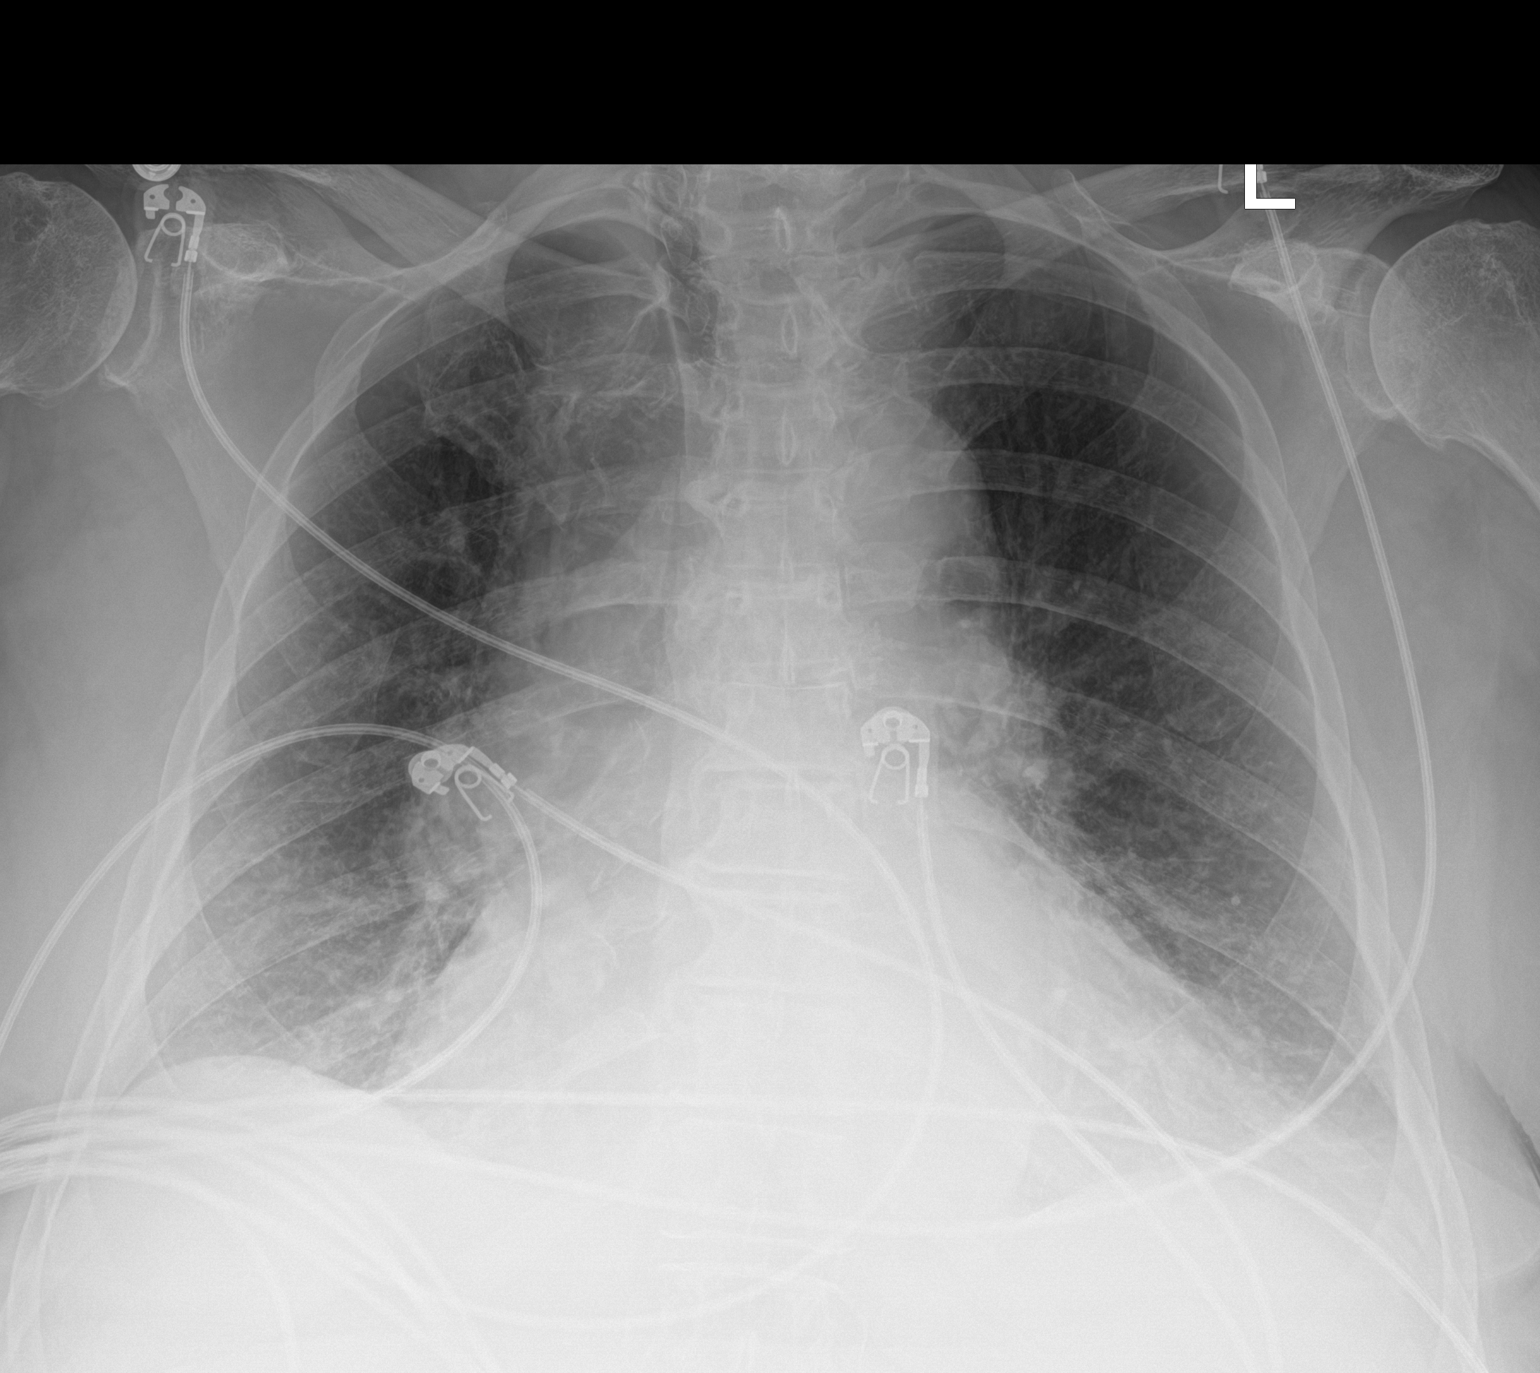

[1 of 1 positions shown; findings below may reference images not displayed]

FINDINGS: Cardiomegaly with vascular congestion. Increased markings in the
lung bases could reflect atelectasis or early edema. No effusions.
No acute bony abnormality.
IMPRESSION: Cardiomegaly, vascular congestion. Increased markings in the bases
could reflect atelectasis or early edema.

## 2021-12-29 IMAGING — CR DG CHEST 2V
2 series · 2 of 2 positions shown · non-contrast
Comparison: May 03, 2020

CLINICAL DATA: Fever

EXAM:
CHEST - 2 VIEW

[chest ap]
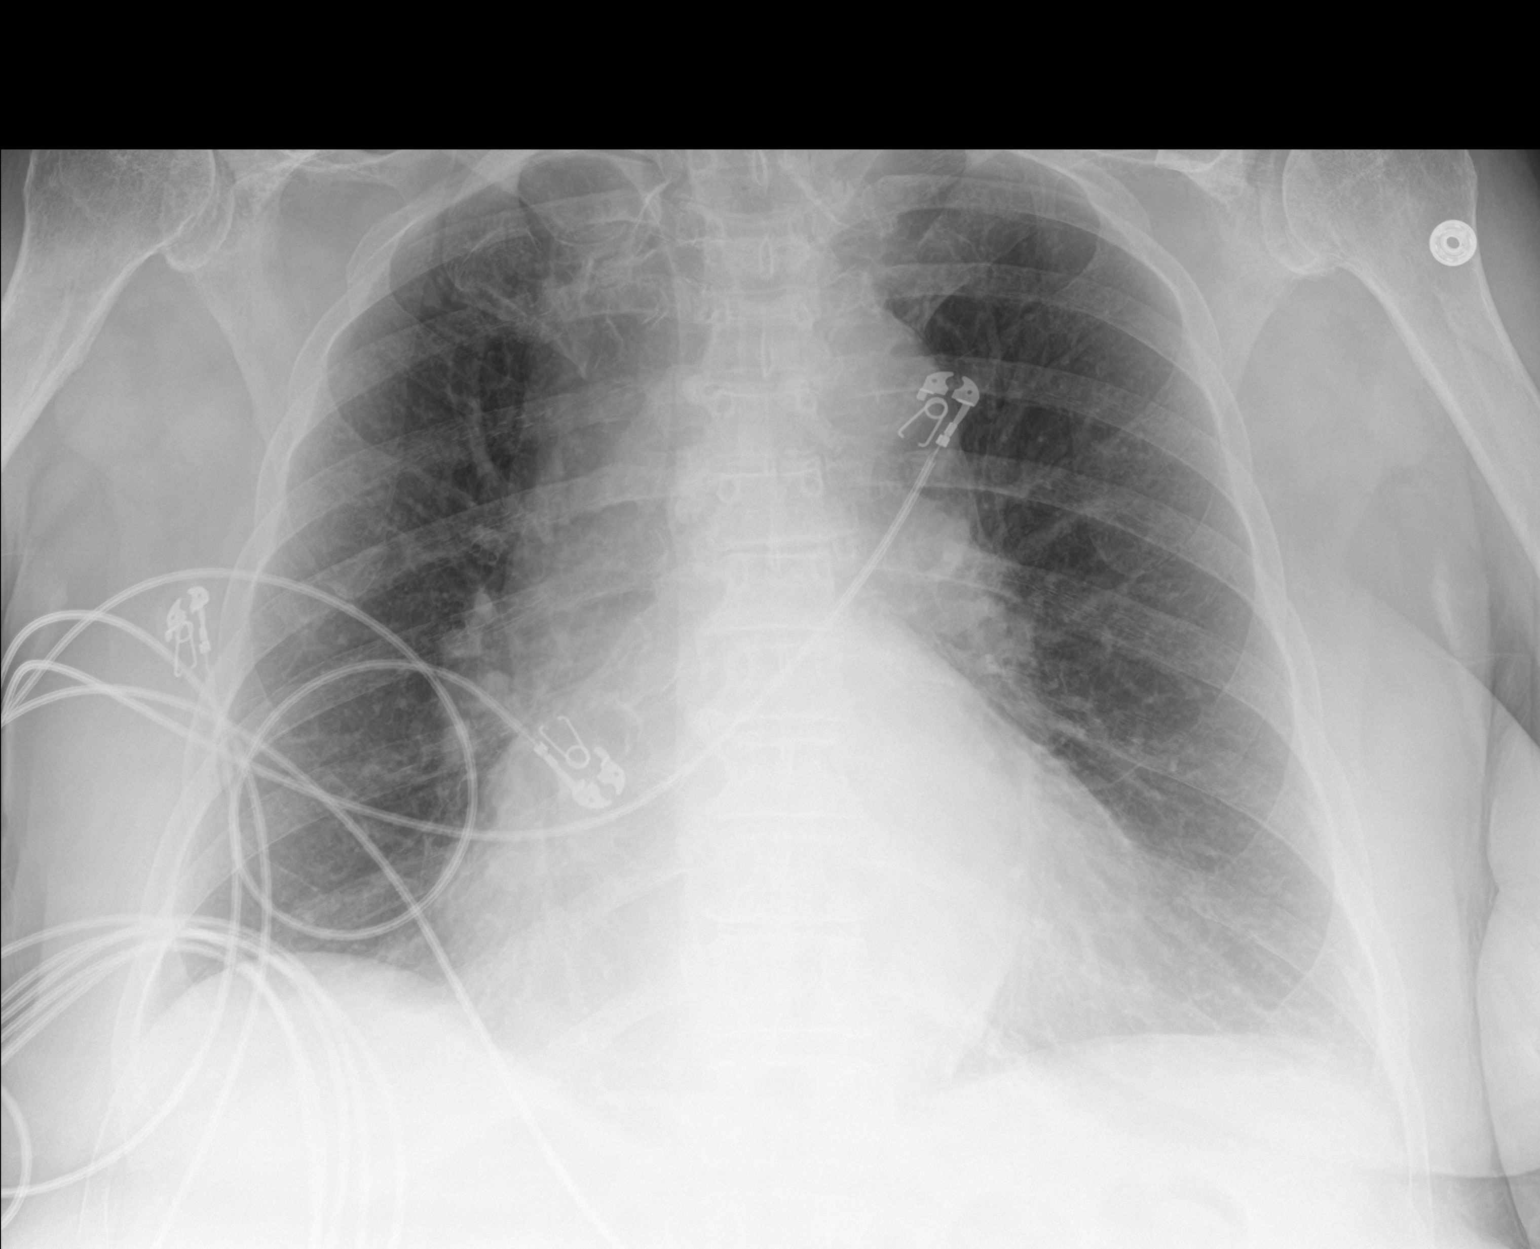

[chest lat]
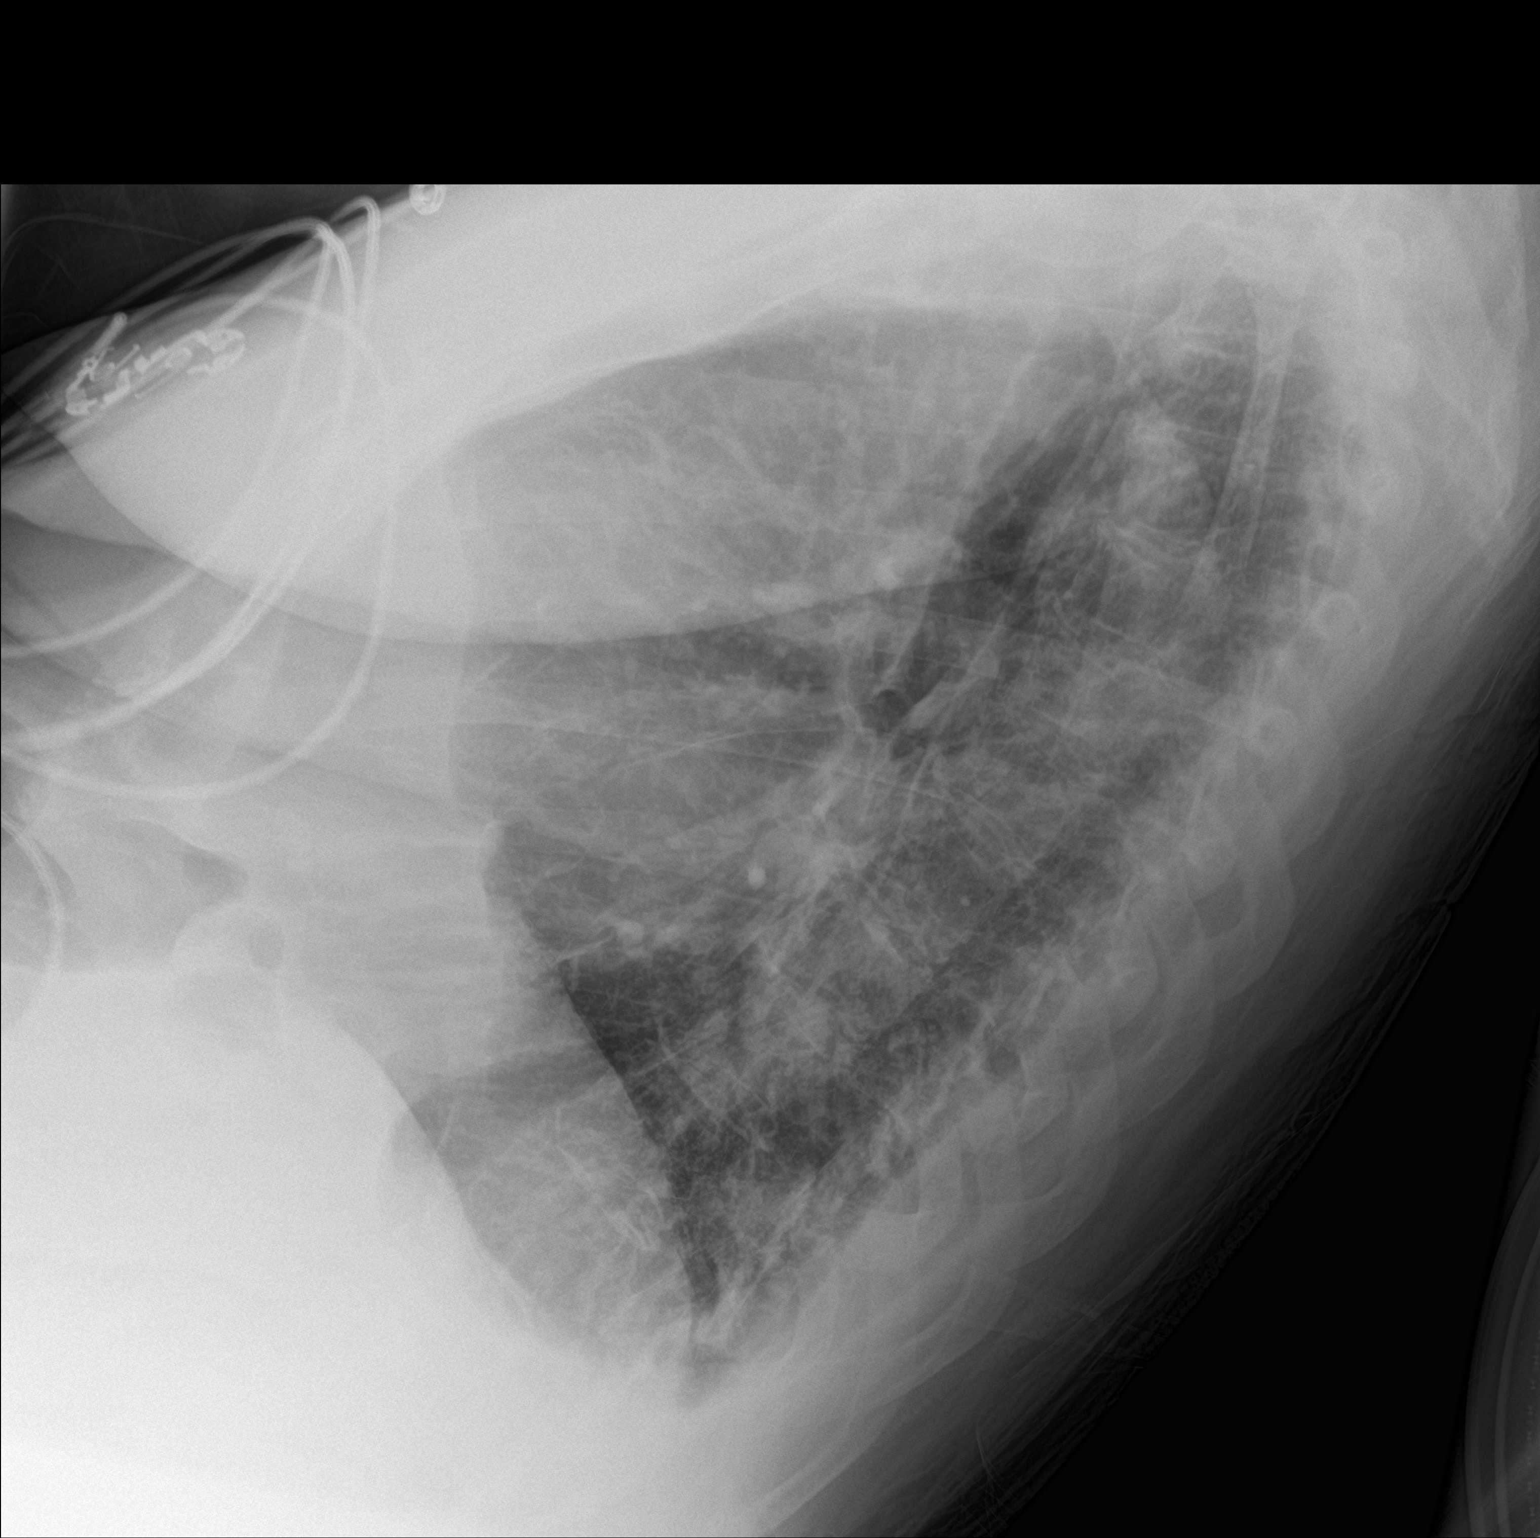

[2 of 2 positions shown; findings below may reference images not displayed]

FINDINGS: Lungs are clear. Heart is enlarged with pulmonary vascularity
normal, stable. No adenopathy. No bone lesions.
IMPRESSION: Stable cardiac prominence.  No edema or airspace opacity.

## 2022-02-18 DIAGNOSIS — I10 Essential (primary) hypertension: Secondary | ICD-10-CM | POA: Diagnosis not present

## 2022-03-14 DIAGNOSIS — M170 Bilateral primary osteoarthritis of knee: Secondary | ICD-10-CM | POA: Diagnosis not present

## 2022-04-19 IMAGING — RF DG FEMUR 2+V*L*
1 series · 6 of 6 positions shown · non-contrast
Comparison: [DATE] a.m.

CLINICAL DATA: Left femoral ORIF

EXAM:
LEFT FEMUR 2 VIEWS; DG C-ARM 1-60 MIN

[Series 1: run · 6 of 6 slices shown]
[im 1/6]
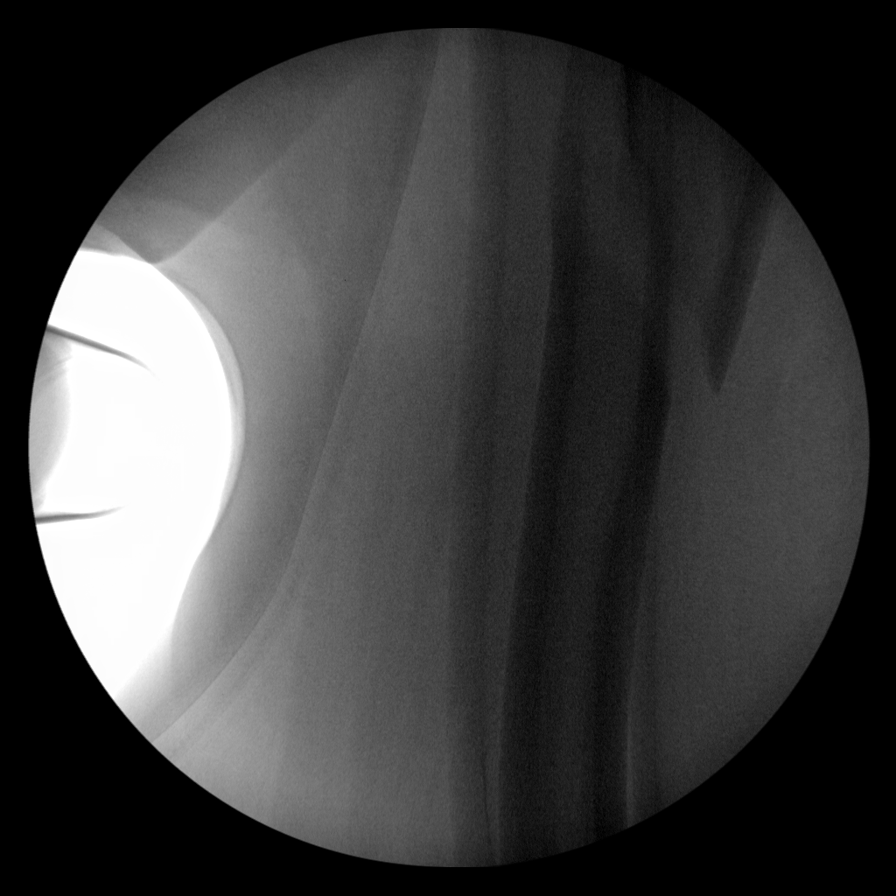
[im 2/6]
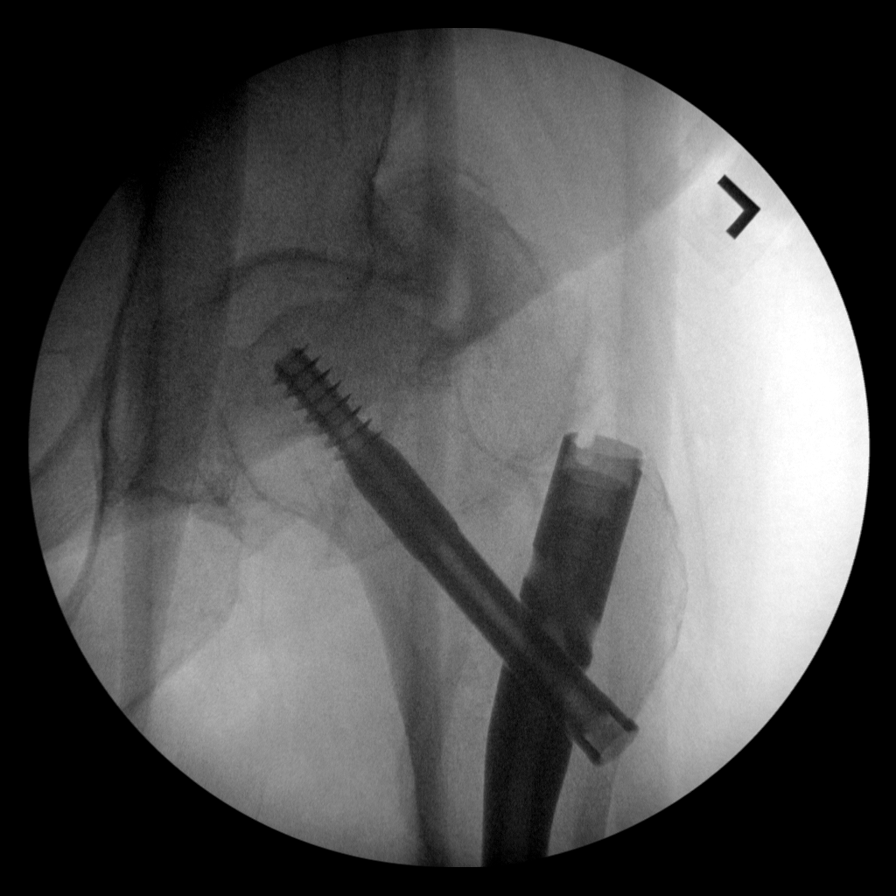
[im 3/6]
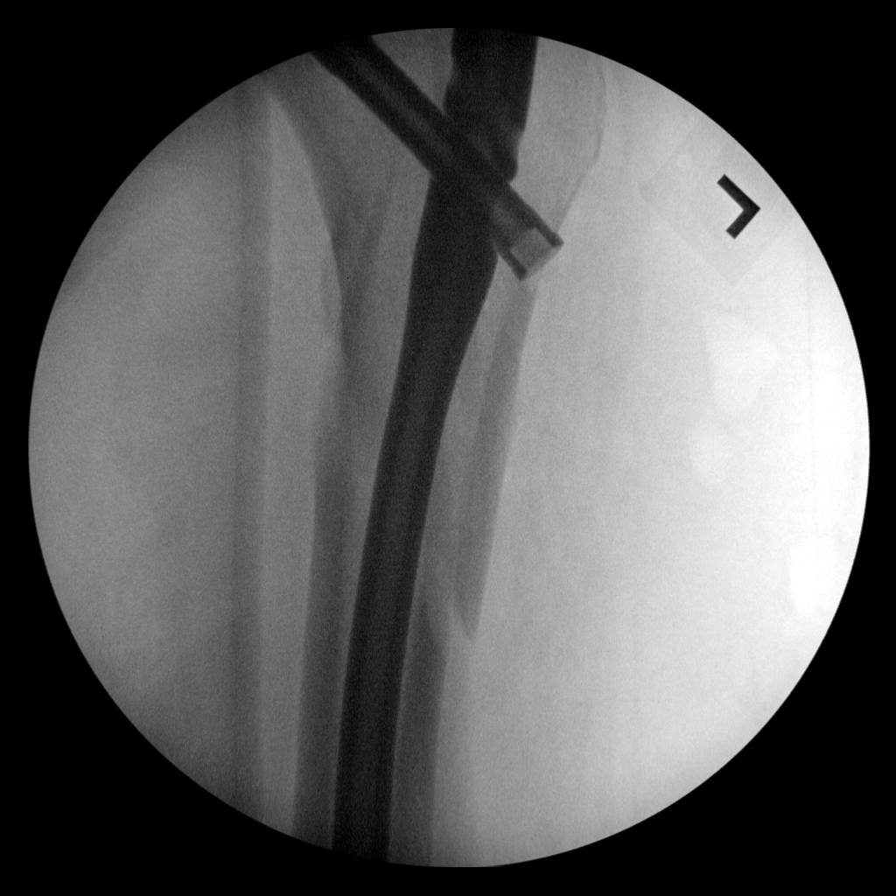
[im 4/6]
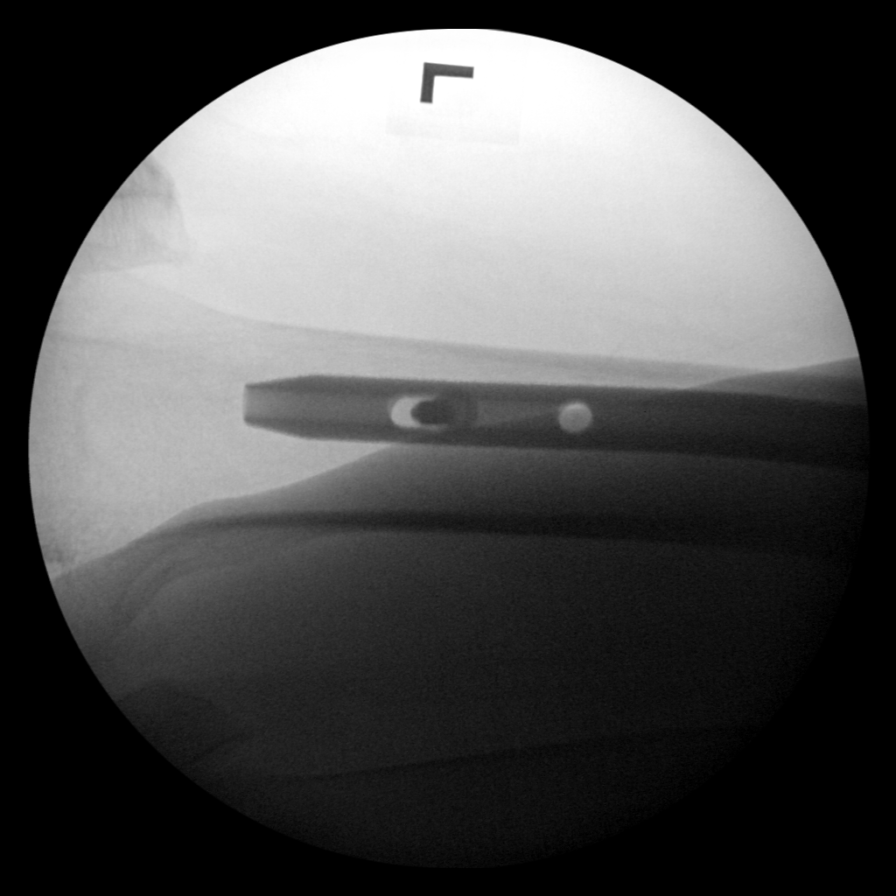
[im 5/6]
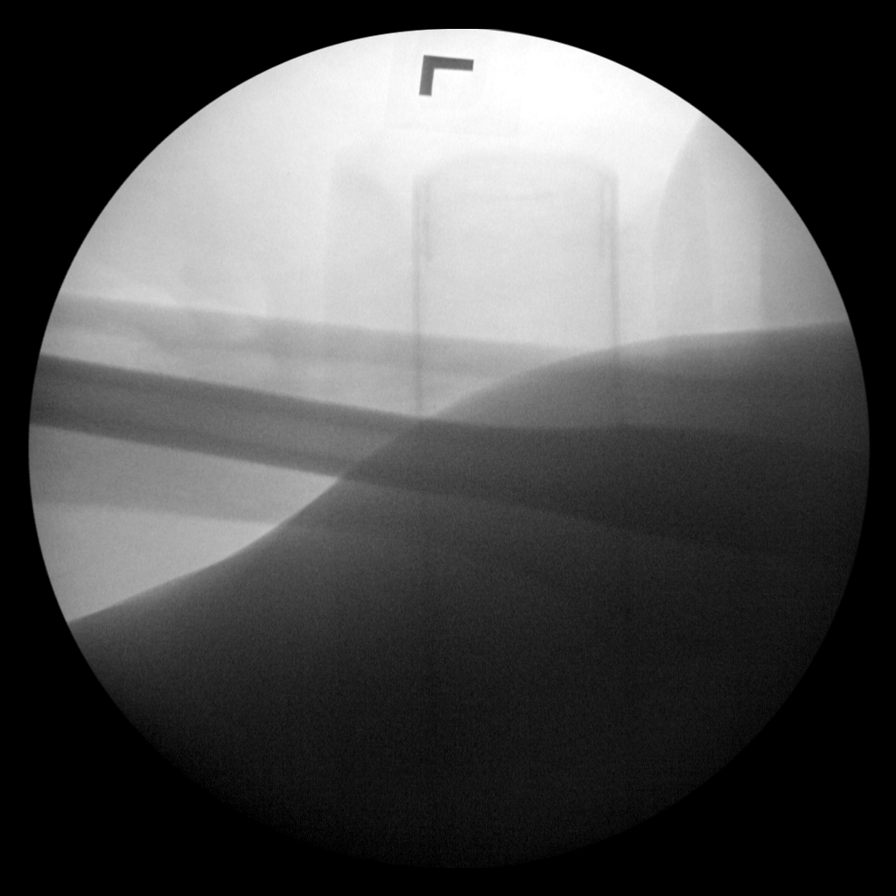
[im 6/6]
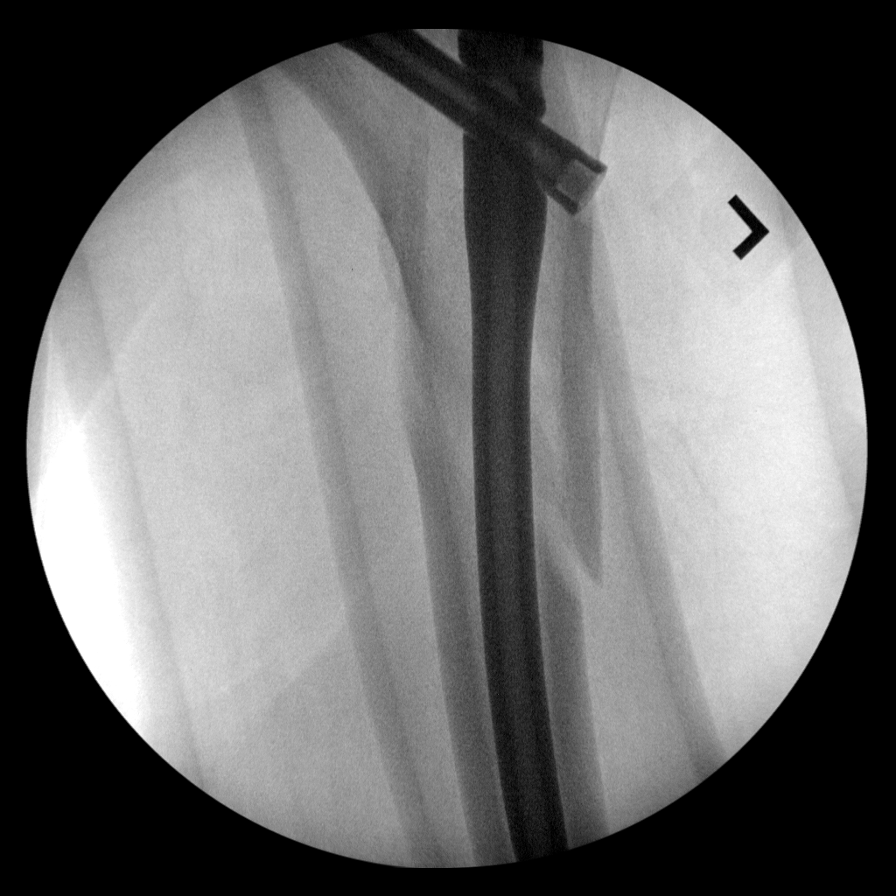

[6 of 6 positions shown; findings below may reference images not displayed]

FINDINGS: Six fluoroscopic intraoperative radiographs of the left hip
demonstrate ORIF of a subtrochanteric femoral fracture utilizing a
intramedullary rod with proximal and distal interlocking screws.
Fracture fragments are in grossly anatomic alignment on this limited
examination. No unexpected fracture or dislocation. Moderate left
hip degenerative arthritis noted. Extensive heterotopic ossification
surrounds the left hip.

Fluoroscopy time: 2 minutes 12 seconds

Fluoroscopic images: 6

Dose: Not provided, refer to operative report
IMPRESSION: Left femoral ORIF as described above

## 2022-04-19 IMAGING — RF DG C-ARM 1-60 MIN
1 series · 6 of 6 positions shown · non-contrast
Comparison: [DATE] a.m.

CLINICAL DATA: Left femoral ORIF

EXAM:
LEFT FEMUR 2 VIEWS; DG C-ARM 1-60 MIN

[Series 1: run · 6 of 6 slices shown]
[im 1/6]
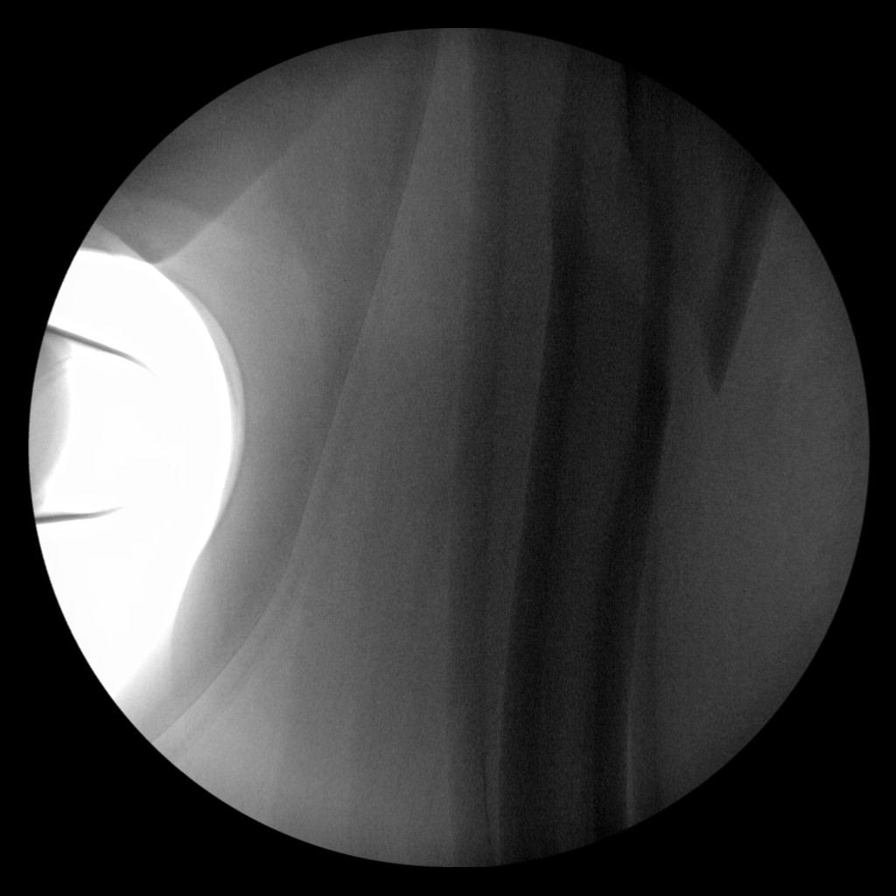
[im 2/6]
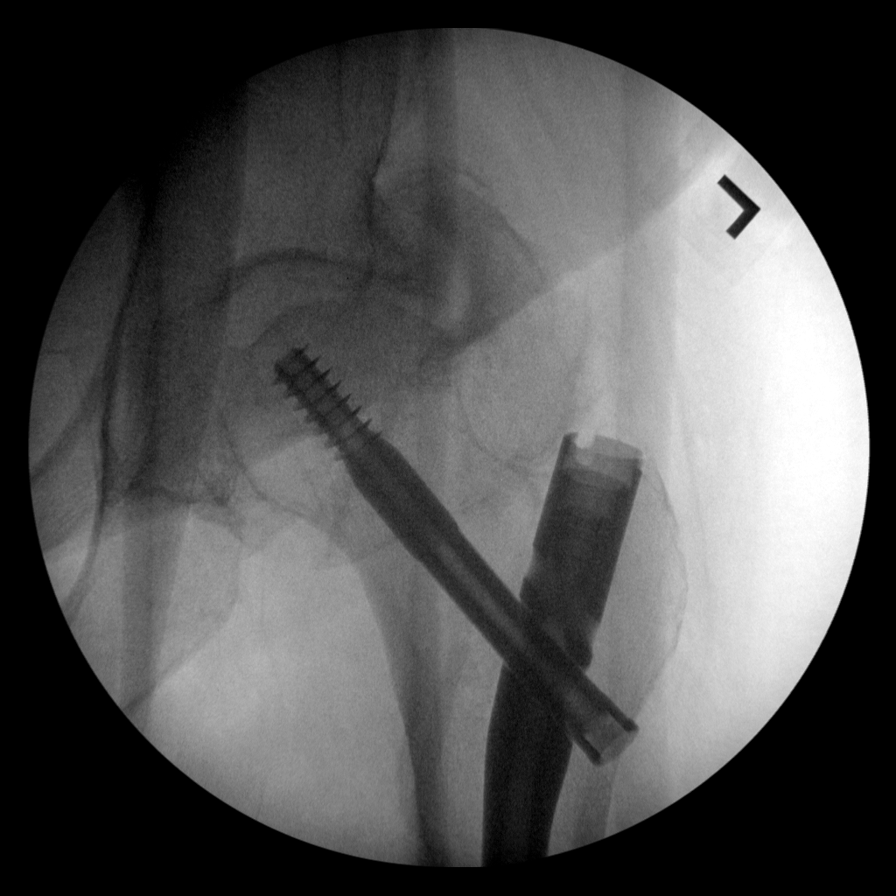
[im 3/6]
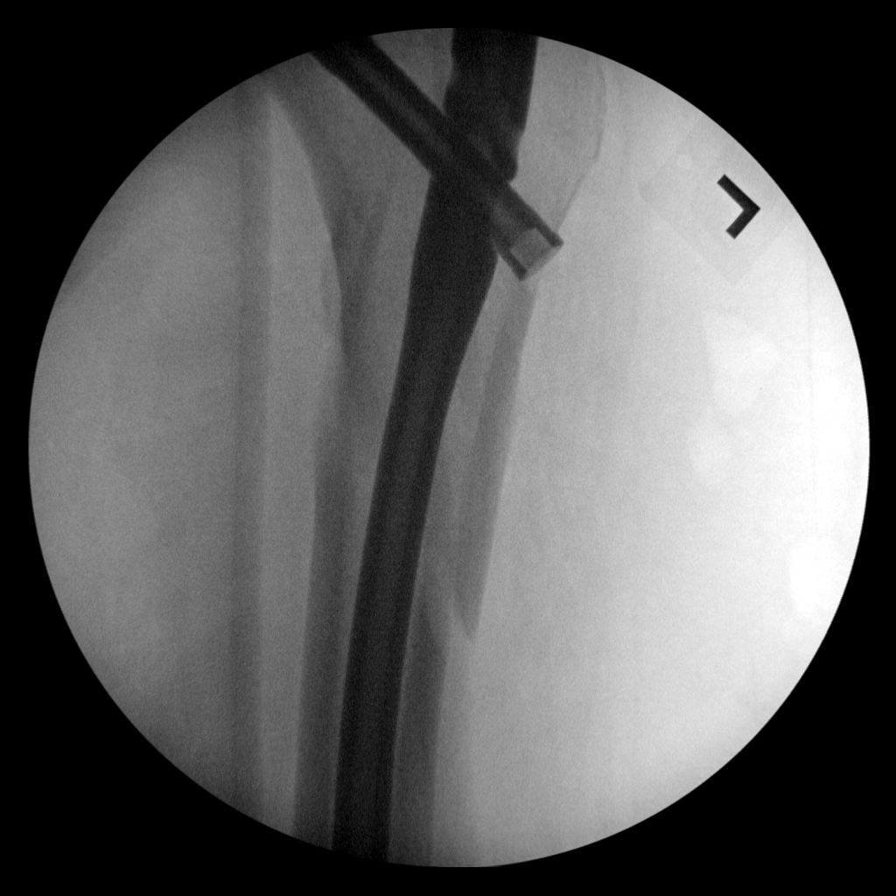
[im 4/6]
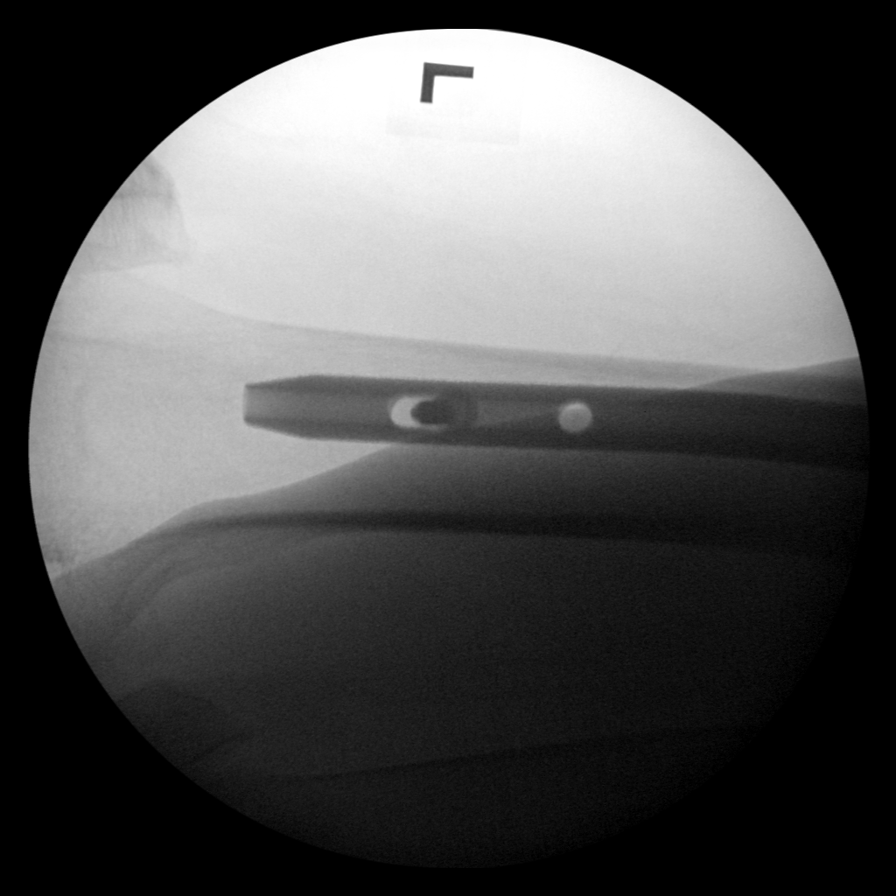
[im 5/6]
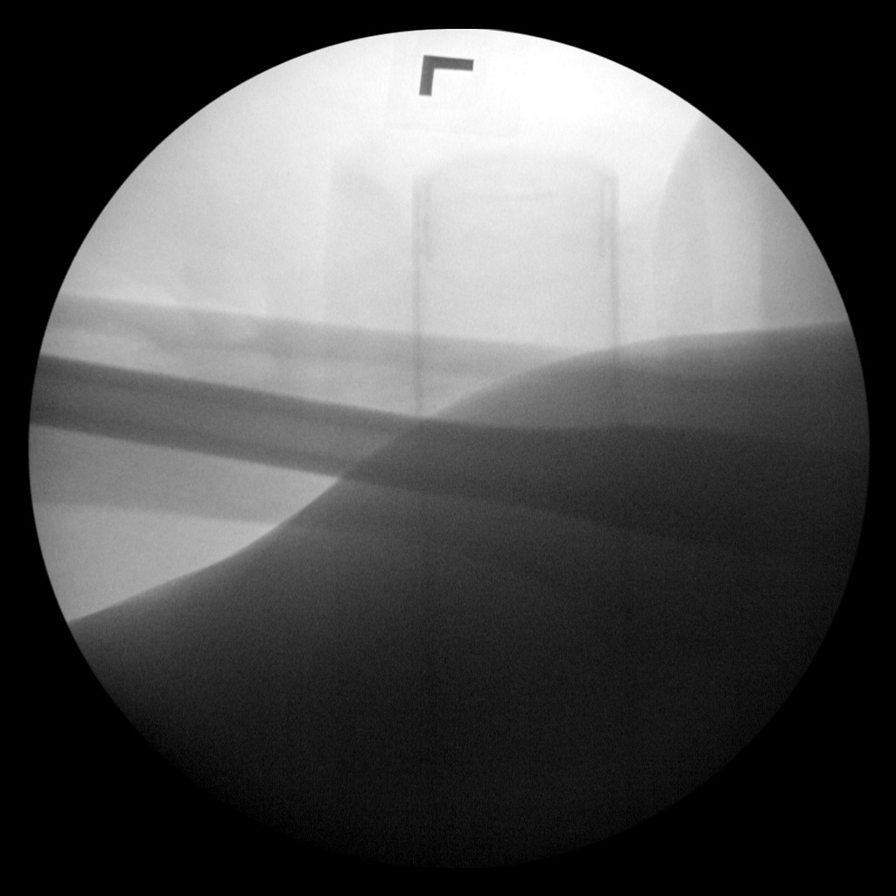
[im 6/6]
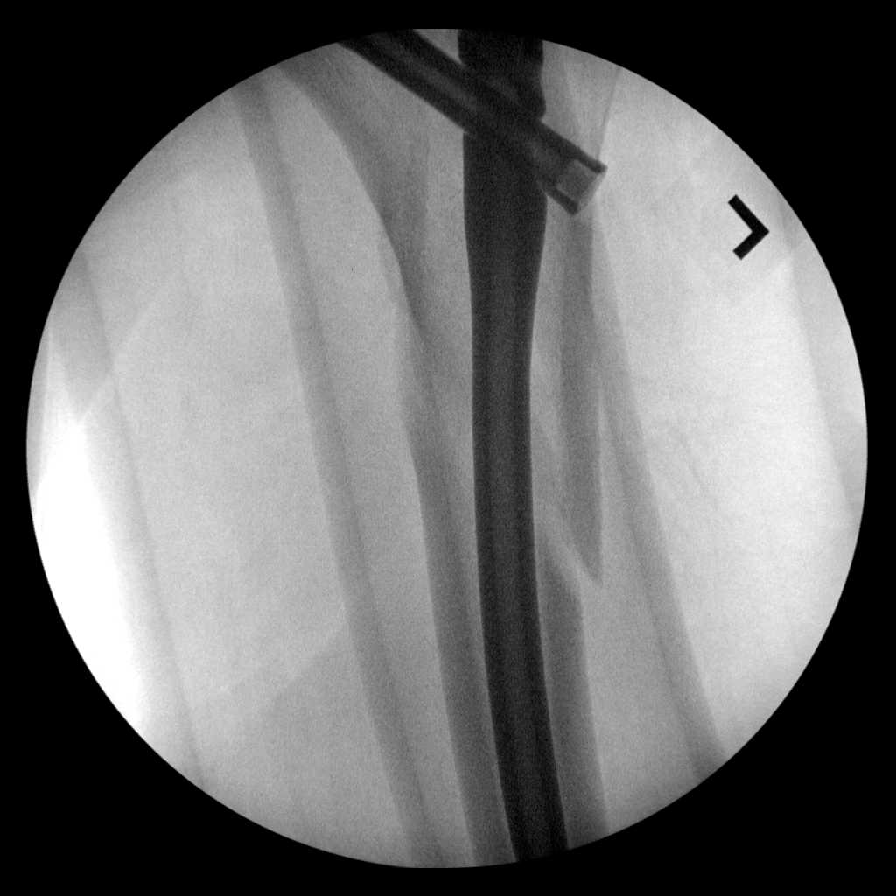

[6 of 6 positions shown; findings below may reference images not displayed]

FINDINGS: Six fluoroscopic intraoperative radiographs of the left hip
demonstrate ORIF of a subtrochanteric femoral fracture utilizing a
intramedullary rod with proximal and distal interlocking screws.
Fracture fragments are in grossly anatomic alignment on this limited
examination. No unexpected fracture or dislocation. Moderate left
hip degenerative arthritis noted. Extensive heterotopic ossification
surrounds the left hip.

Fluoroscopy time: 2 minutes 12 seconds

Fluoroscopic images: 6

Dose: Not provided, refer to operative report
IMPRESSION: Left femoral ORIF as described above

## 2022-04-30 DIAGNOSIS — I480 Paroxysmal atrial fibrillation: Secondary | ICD-10-CM | POA: Diagnosis not present

## 2022-04-30 DIAGNOSIS — I110 Hypertensive heart disease with heart failure: Secondary | ICD-10-CM | POA: Diagnosis not present

## 2022-04-30 DIAGNOSIS — I5020 Unspecified systolic (congestive) heart failure: Secondary | ICD-10-CM | POA: Diagnosis not present

## 2022-04-30 DIAGNOSIS — R053 Chronic cough: Secondary | ICD-10-CM | POA: Diagnosis not present

## 2022-04-30 DIAGNOSIS — I890 Lymphedema, not elsewhere classified: Secondary | ICD-10-CM | POA: Diagnosis not present

## 2022-04-30 DIAGNOSIS — D6869 Other thrombophilia: Secondary | ICD-10-CM | POA: Diagnosis not present

## 2022-05-14 ENCOUNTER — Encounter (HOSPITAL_BASED_OUTPATIENT_CLINIC_OR_DEPARTMENT_OTHER): Payer: Medicare HMO | Admitting: Physician Assistant

## 2022-05-18 DIAGNOSIS — R404 Transient alteration of awareness: Secondary | ICD-10-CM | POA: Diagnosis not present

## 2022-05-18 DIAGNOSIS — Z743 Need for continuous supervision: Secondary | ICD-10-CM | POA: Diagnosis not present

## 2022-05-18 DIAGNOSIS — R55 Syncope and collapse: Secondary | ICD-10-CM | POA: Diagnosis not present

## 2022-06-11 ENCOUNTER — Ambulatory Visit (HOSPITAL_BASED_OUTPATIENT_CLINIC_OR_DEPARTMENT_OTHER): Payer: Medicare HMO | Admitting: General Surgery

## 2022-06-11 DEATH — deceased
